# Patient Record
Sex: Female | Born: 1985 | Race: White | Hispanic: No | Marital: Married | Smoking: Never smoker
Health system: Southern US, Community
[De-identification: ages and names within clinical notes are randomized; demographics above are authoritative.]

## PROBLEM LIST (undated history)

## (undated) ENCOUNTER — Emergency Department (HOSPITAL_COMMUNITY): Admission: EM | Payer: Medicaid Other | Source: Home / Self Care

## (undated) DIAGNOSIS — O09299 Supervision of pregnancy with other poor reproductive or obstetric history, unspecified trimester: Secondary | ICD-10-CM

## (undated) DIAGNOSIS — F410 Panic disorder [episodic paroxysmal anxiety] without agoraphobia: Secondary | ICD-10-CM

## (undated) DIAGNOSIS — F329 Major depressive disorder, single episode, unspecified: Secondary | ICD-10-CM

## (undated) DIAGNOSIS — R51 Headache: Secondary | ICD-10-CM

## (undated) DIAGNOSIS — B977 Papillomavirus as the cause of diseases classified elsewhere: Secondary | ICD-10-CM

## (undated) DIAGNOSIS — F32A Depression, unspecified: Secondary | ICD-10-CM

## (undated) DIAGNOSIS — F4310 Post-traumatic stress disorder, unspecified: Secondary | ICD-10-CM

## (undated) DIAGNOSIS — H539 Unspecified visual disturbance: Secondary | ICD-10-CM

## (undated) DIAGNOSIS — R519 Headache, unspecified: Secondary | ICD-10-CM

## (undated) DIAGNOSIS — F319 Bipolar disorder, unspecified: Secondary | ICD-10-CM

## (undated) HISTORY — DX: Headache, unspecified: R51.9

## (undated) HISTORY — DX: Post-traumatic stress disorder, unspecified: F43.10

## (undated) HISTORY — DX: Major depressive disorder, single episode, unspecified: F32.9

## (undated) HISTORY — DX: Unspecified visual disturbance: H53.9

## (undated) HISTORY — DX: Panic disorder (episodic paroxysmal anxiety): F41.0

## (undated) HISTORY — DX: Headache: R51

## (undated) HISTORY — DX: Depression, unspecified: F32.A

## (undated) HISTORY — DX: Supervision of pregnancy with other poor reproductive or obstetric history, unspecified trimester: O09.299

## (undated) HISTORY — PX: BLOOD PATCH: SHX1245

## (undated) HISTORY — DX: Papillomavirus as the cause of diseases classified elsewhere: B97.7

## (undated) HISTORY — DX: Bipolar disorder, unspecified: F31.9

---
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
## 2012-10-27 ENCOUNTER — Ambulatory Visit (HOSPITAL_COMMUNITY)
Admission: RE | Admit: 2012-10-27 | Discharge: 2012-10-27 | Disposition: A | Discharge status: Home / Self Care | Payer: Self-pay | Source: Ambulatory Visit | Attending: Internal Medicine | Admitting: Internal Medicine

## 2012-10-27 ENCOUNTER — Other Ambulatory Visit (HOSPITAL_COMMUNITY): Payer: Self-pay | Admitting: Internal Medicine

## 2012-10-27 DIAGNOSIS — R05 Cough: Secondary | ICD-10-CM | POA: Insufficient documentation

## 2012-10-27 DIAGNOSIS — R059 Cough, unspecified: Secondary | ICD-10-CM | POA: Insufficient documentation

## 2012-10-27 DIAGNOSIS — J40 Bronchitis, not specified as acute or chronic: Secondary | ICD-10-CM

## 2013-01-28 ENCOUNTER — Ambulatory Visit (HOSPITAL_COMMUNITY): Payer: Self-pay | Admitting: Psychiatry

## 2015-11-08 DIAGNOSIS — F3181 Bipolar II disorder: Secondary | ICD-10-CM | POA: Insufficient documentation

## 2016-08-06 ENCOUNTER — Ambulatory Visit (INDEPENDENT_AMBULATORY_CARE_PROVIDER_SITE_OTHER): Payer: PRIVATE HEALTH INSURANCE | Admitting: Neurology

## 2016-08-06 ENCOUNTER — Encounter: Payer: Self-pay | Admitting: Neurology

## 2016-08-06 DIAGNOSIS — H4710 Unspecified papilledema: Secondary | ICD-10-CM | POA: Diagnosis not present

## 2016-08-06 DIAGNOSIS — G4700 Insomnia, unspecified: Secondary | ICD-10-CM | POA: Insufficient documentation

## 2016-08-06 DIAGNOSIS — G4719 Other hypersomnia: Secondary | ICD-10-CM | POA: Diagnosis not present

## 2016-08-06 DIAGNOSIS — R51 Headache: Secondary | ICD-10-CM

## 2016-08-06 DIAGNOSIS — G4733 Obstructive sleep apnea (adult) (pediatric): Secondary | ICD-10-CM | POA: Diagnosis not present

## 2016-08-06 DIAGNOSIS — R0683 Snoring: Secondary | ICD-10-CM | POA: Diagnosis not present

## 2016-08-06 DIAGNOSIS — H539 Unspecified visual disturbance: Secondary | ICD-10-CM | POA: Insufficient documentation

## 2016-08-06 DIAGNOSIS — R519 Headache, unspecified: Secondary | ICD-10-CM | POA: Insufficient documentation

## 2016-08-06 MED ORDER — DOXEPIN HCL 10 MG PO CAPS: 10.0000 mg | ORAL_CAPSULE | Freq: Every day | ORAL | 11 refills | Status: DC

## 2016-08-06 NOTE — Progress Notes (Signed)
[facility]  PATIENT: Veronica Hendrix DOB: 03/24/1986  REFERRING DOCTOR OR PCP:  Dr. Conley Rolls (Optometry; Fax [phone]); has appt to see new PCP [name] phone [phone]) SOURCE: patient, notes from Dr. Conley Rolls.    _________________________________   HISTORICAL  CHIEF COMPLAINT:  Chief Complaint  Patient presents with  . Headache    Veronica Hendrix is here for eval of h/a's, and to discuss results of abnormal eye exam.  Sts. onset 6 weeks ago of right sided h/a, assoc. with  nausea, one episode of vomiting.  No relief with Fioricet or otc Tylenol or Motrin.  Denes double or blurry vision. She saw her opthalmologist at [facility].  Notes state bilat optic nerve edema was noted, so she was referred to Dr. Lenice Pressman  . Optic Nerve Edema    HISTORY OF PRESENT ILLNESS:  I had the pleasure seeing you patient, Veronica Hendrix, at [facility] for neurologic consultation regarding her headaches and optic nerve edema. She is a 30 year old woman who has a history of intermittent headaches for many years. About a year ago, she began to notice more headaches. For while these were occurring daily and she sore Dr. Conley Rolls.  She was told she had abnormality and was supposed to come back a couple weeks later but was unable to do so. Headaches improved for a few months however, a couple months ago the headaches worsen again and now are occurring daily. The headaches are present when she wakes up and will sometimes get a little bit better after she is up and moving for a while.  However, later in the day the headaches worsen again. Describes the pain as pounding.Some of them are associated with she vomited once. The headaches are usually on the right side more than the left side. .   She notes that the pain will increase if she squats and gets back up. She also will feel dizzy if she does so. Over-the-counter medication has not helped. Fioricet takes the edge off for about  one hour but then the headache returns. She went back to see Dr. 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 Dr. Conley Rolls has sent her to our office for further evaluation.  She has noted mild visual disturbance. Specifically, per for vision is a little bit off at times. Additionally, if she squats and gets back up she will see dots in her vision for a minute or so. She denies any double vision. She denies any difficulty with her gait. There is no weakness or numbness. No changes are noted in bladder function.  She also notes difficulties with gait and sleepiness she snores but nobody has commented if she has pauses in her breathing or gasping. However, she has woken herself up with a large snore many times.    Additionally, she has moderate excessive daytime sleepiness and will doze off many evenings. Sleep is somewhat disturbed by her child who is currently 29 years old.   She weighs about 50 pounds more now than she did 4 years ago before her pregnancy.   EPWORTH SLEEPINESS SCALE  On a scale of 0 - 3 what is the chance of dozing:  Sitting and Reading:   1 Watching TV:    3 Sitting inactive in a public place: 2 Passenger in car for one hour: 3 Lying down to rest in the afternoon: 3 Sitting and talking to  someone: 0 Sitting quietly after lunch:  3 In a car, stopped in traffic:  0  Total (out of 24):    15/24 (moderate OSA)   REVIEW OF SYSTEMS: Constitutional: No fevers, chills, sweats, or change in appetite Eyes: Nas above Ear, nose and throat: No hearing loss, ear pain, nasal congestion, sore throat Cardiovascular: No chest pain, palpitations Respiratory: No shortness of breath at rest or with exertion.   No wheezes.   She snores and has some OSA signs.   GastrointestinaI: No nausea, vomiting, diarrhea, abdominal pain, fecal incontinence Genitourinary: No dysuria, urinary retention or frequency.  No nocturia. Musculoskeletal: No  neck pain, back pain Integumentary: No rash, pruritus, skin lesions Neurological: as above Psychiatric: No depression at this time.  No anxiety Endocrine: No palpitations, diaphoresis, change in appetite, change in weigh or increased thirst Hematologic/Lymphatic: No anemia, purpura, petechiae. Allergic/Immunologic: No itchy/runny eyes, nasal congestion, recent allergic reactions, rashes  ALLERGIES: No Known Allergies  HOME MEDICATIONS:  Current Outpatient Prescriptions:  .  butalbital-acetaminophen-caffeine (FIORICET, ESGIC) 50-325-40 MG tablet, Take by mouth 2 (two) times daily as needed for headache., Disp: , Rfl:  .  doxepin (SINEQUAN) 10 MG capsule, Take 1 capsule (10 mg total) by mouth at bedtime., Disp: 30 capsule, Rfl: 11  PAST MEDICAL HISTORY: Past Medical History:  Diagnosis Date  . Bipolar 1 disorder (HCC)   . Headache   . Panic attacks   . Post traumatic stress disorder (PTSD)   . Vision abnormalities     PAST SURGICAL HISTORY: No past surgical history on file.  FAMILY HISTORY: Family History  Problem Relation Age of Onset  . Diabetes Mother   . Mental illness Father     SOCIAL HISTORY:  Social History   Social History  . Marital status: Single    Spouse name: N/A  . Number of children: N/A  . Years of education: N/A   Occupational History  . Not on file.   Social History Main Topics  . Smoking status: Not on file  . Smokeless tobacco: Not on file  . Alcohol use Not on file  . Drug use: Unknown  . Sexual activity: Not on file   Other Topics Concern  . Not on file   Social History Narrative  . No narrative on file     PHYSICAL EXAM  Vitals:   08/06/16 1003  BP: 126/82  Pulse: 76  Resp: 18  Weight: 224 lb (101.6 kg)  Height: 5\' 5"  (1.651 m)    Body mass index is 37.28 kg/m.   General: The patient is well-developed and well-nourished and in no acute distress  Eyes:  Funduscopic exam shows mild to moderate papilledema and  absence of venous pulsations.  Neck: The neck is supple, no carotid bruits are noted.  The neck is nontender.  Cardiovascular: The heart has a regular rate and rhythm with a normal S1 and S2. There were no murmurs, gallops or rubs. Lungs are clear to auscultation.  Skin: Extremities are without significant edema.  Musculoskeletal:  Back is nontender  Neurologic Exam  Mental status: The patient is alert and oriented x 3 at the time of the examination. The patient has apparent normal recent and remote memory, with an apparently normal attention span and concentration ability.   Speech is normal.  Cranial nerves: Extraocular movements are full. Pupils are equal, round, and reactive to light and accomodation.  Visual fields are full to confrontation.  Color vision was symmetric,    Facial symmetry  is present. There is good facial sensation to soft touch bilaterally.Facial strength is normal.  Trapezius and sternocleidomastoid strength is normal. No dysarthria is noted.  The tongue is midline, and the patient has symmetric elevation of the soft palate. No obvious hearing deficits are noted.  Motor:  Muscle bulk is normal.   Tone is normal. Strength is  5 / 5 in all 4 extremities.   Sensory: Sensory testing is intact to pinprick, soft touch and vibration sensation in all 4 extremities.  Coordination: Cerebellar testing reveals good finger-nose-finger and heel-to-shin bilaterally.  Gait and station: Station is normal.   Gait is normal. Tandem gait is normal. Romberg is negative.   Reflexes: Deep tendon reflexes are symmetric and normal bilaterally.   Plantar responses are flexor.    DIAGNOSTIC DATA (LABS, IMAGING, TESTING) - I reviewed patient records, labs, notes, testing and imaging myself where available.     ASSESSMENT AND PLAN  Papilledema of both eyes - Plan: MR BRAIN W WO CONTRAST, MR ORBITS W WO CONTRAST  Chronic intractable headache, unspecified headache type - Plan: MR BRAIN  W WO CONTRAST, MR ORBITS W WO CONTRAST  Visual disturbance - Plan: MR BRAIN W WO CONTRAST, MR ORBITS W WO CONTRAST  Snoring - Plan: Split night study  OSA (obstructive sleep apnea) - Plan: Split night study  Excessive daytime sleepiness - Plan: Split night study   In summary, Veronica Hendrix is a 30 year old woman with bilateral nerve edema and headaches. She also gets episodes of visual changes as she stands up. Her symptoms are consistent with idiopathic intracranial hypertension (IIH; pseudotumor cerebri). However, we must also rule out a mass lesion. We will check an MRI of the brain with and without contrast. If the MRI of the brain does not show any contraindication to lumbar puncture, we will arrange this once we get the results. Additionally, she has symptoms of snoring, excessive daytime sleepiness and waking up gasping consistent with obstructive sleep apnea.   About a third or more of patients with idiopathic intracranial hypertension have obstructive sleep apnea and this may be playing some role in development of high IIH.    Therefore, I will check a split-night PSG and get her started on CPAP if she has moderate or severe sleep apnea.  She will return to see me in 2 months or sooner if there are new or worsening neurologic symptoms.  We will be calling her with the results of the studies as they are performed.  Thank you for asking me to see Mrs. Hendrix. Please let me know if I can be of further assistance with her or other patients in the future.      Dovid Bartko A. Epimenio Foot, MD, PhD 08/06/2016, 1:19 PM Certified in Neurology, Clinical Neurophysiology, Sleep Medicine, Pain Medicine and Neuroimaging  [facility] [address], [address] [place], [place] [address] [phone]

## 2016-08-07 ENCOUNTER — Ambulatory Visit (INDEPENDENT_AMBULATORY_CARE_PROVIDER_SITE_OTHER): Payer: PRIVATE HEALTH INSURANCE | Admitting: Pediatrics

## 2016-08-07 ENCOUNTER — Encounter: Payer: Self-pay | Admitting: Pediatrics

## 2016-08-07 VITALS — BP 113/84 | HR 73 | Temp 98.0°F | Ht 65.0 in | Wt 224.0 lb

## 2016-08-07 DIAGNOSIS — F319 Bipolar disorder, unspecified: Secondary | ICD-10-CM | POA: Diagnosis not present

## 2016-08-07 DIAGNOSIS — F411 Generalized anxiety disorder: Secondary | ICD-10-CM

## 2016-08-07 DIAGNOSIS — Z6834 Body mass index (BMI) 34.0-34.9, adult: Secondary | ICD-10-CM | POA: Insufficient documentation

## 2016-08-07 DIAGNOSIS — H4710 Unspecified papilledema: Secondary | ICD-10-CM

## 2016-08-07 DIAGNOSIS — Z6837 Body mass index (BMI) 37.0-37.9, adult: Secondary | ICD-10-CM | POA: Diagnosis not present

## 2016-08-07 MED ORDER — HYDROXYZINE HCL 10 MG PO TABS: 10.0000 mg | ORAL_TABLET | Freq: Three times a day (TID) | ORAL | 0 refills | Status: DC | PRN

## 2016-08-07 MED ORDER — BREXPIPRAZOLE 0.5 MG PO TABS: 0.5000 mg | ORAL_TABLET | Freq: Every day | ORAL | 1 refills | Status: DC

## 2016-08-07 NOTE — Progress Notes (Signed)
NOTES FROM TODAY'S OV HAVE BEEN FAXED TO DR. LE AT FAX# [phone], AND ALSO TO [facility] FAX# [phone], WITH FAX CONFIRMATIONS FROM BOTH #'S RECEIVED/fim

## 2016-08-07 NOTE — Progress Notes (Signed)
Subjective:   Patient ID: Veronica Hendrix, female    DOB: 04/11/1986, 30 y.o.   MRN: [id number] CC: New Patient (Initial Visit) f/u multiple med problems  HPI: Veronica Hendrix is a [age] y.o. 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 smear  Works as LawyerCNA  Elevated BMI: Skips meals often Drinking one cup of coffee a day, no other fluids  29yo Collin in preschool Son wakes up every two hours Possible ADHD, autism  Bipolar d/o: says symptoms have been fairly well controlled, was on rexulti in the past [facility] Did feel better on the rexulti than she does now Son with behavior problems, ADHD, ?autism, he is very anxious and wants to be with her all the time, when he cries she gets overwhelmed 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 Feels comfortable at work [employer]els able to do her job, take care of Veronica RumpfColin Pt's father lives with them, helps out with Veronica RumpfColin Denies thoughts of wanting to hurt herself Does say she has thought about not wanting to be here anymore at times in the past, none recent   GAD 7 : Generalized Anxiety Score 08/07/2016  Nervous, Anxious, on Edge 3  Control/stop worrying 2  Worry too much - different things 3  Trouble relaxing 2  Restless 1  Easily annoyed or irritable 3  Afraid - awful might happen 2  Total GAD 7 Score 16  Anxiety Difficulty Very difficult   Depression screen PHQ 2/9 08/07/2016  Decreased Interest 1  Down, Depressed, Hopeless 1  PHQ - 2 Score 2  Altered sleeping 2  Tired, decreased energy 2  Change in appetite 2  Feeling bad or failure about yourself  2  Trouble concentrating 2  Moving slowly or fidgety/restless 1  Suicidal thoughts 1  PHQ-9 Score 14  Difficult  doing work/chores Somewhat difficult     Past Medical History:  Diagnosis Date  . Bipolar 1 disorder (HCC)   . Headache   . HPV in female   . Panic attacks   . Post traumatic stress disorder (PTSD)   . Vision abnormalities    Family History  Problem Relation Age of Onset  . Diabetes Mother   . Mental illness Father   . Hypertension Father   . Heart disease Maternal Grandmother   . Heart disease Maternal Grandfather   . Heart disease Paternal Grandfather    Social History   Social History  . Marital status: Legally Separated    Spouse name: N/A  . Number of children: N/A  . Years of education: N/A   Social History Main Topics  . Smoking status: Never Smoker  . Smokeless tobacco: Never Used  . Alcohol use No  . Drug use: No  . Sexual activity: Yes    Birth control/ protection: Inserts     Comment: Mirena   Other Topics Concern  . None   Social History Narrative  . None   ROS: All systems negative other than what is in HPI  Objective:    BP 113/84   Pulse 73   Temp 98 F (36.7 C) (Oral)   Ht 5\' 5"  (1.651 m)   Wt 224 lb (101.6 kg)   BMI 37.28 kg/m  Wt Readings from Last 3 Encounters:  08/07/16 224 lb (101.6 kg)  08/06/16 224 lb (101.6 kg)    Gen: NAD, alert, cooperative with exam, NCAT EYES: EOMI, no conjunctival injection, or no icterus ENT:  TMs pearly gray b/l, OP without erythema LYMPH: no cervical LAD CV: NRRR, normal S1/S2, no murmur, distal pulses 2+ b/l Resp: CTABL, no wheezes, normal WOB Abd: +BS, soft, NTND. no guarding or organomegaly Ext: No edema, warm Neuro: Alert and oriented, strength equal b/l UE and LE, coordination grossly normal Psych: tearful at times, Full affect, mood "up and down", normal speech  Assessment & Plan:  Rai was seen today for new patient (initial visit), f/u multiple med problems.  Diagnoses and all orders for this visit:  Bipolar I disorder (HCC) Was on rexulti in the past, felt much better, lost to  follow up with psychiatry so stopped the medication Will restart, let me know if any worsenin gof symptoms Feels safe at home Encouraged pt to talk with pediatrician about son's behavior -     Brexpiprazole (REXULTI) 0.5 MG TABS; Take 1 tablet (0.5 mg total) by mouth daily. -     Ambulatory referral to Psychiatry  Anxiety state Has taken in the past, helped with anxiety -     hydrOXYzine (ATARAX/VISTARIL) 10 MG tablet; Take 1 tablet (10 mg total) by mouth 3 (three) times daily as needed.  Papilledema of both eyes Has MRI scheduled F/u with neuro  BMI 37.0-37.9, adult Cont lifestyle changes Decrease soda intake, eat three meals a day  Follow up plan: Return in about 2 weeks (around 08/21/2016) for Pap smear/CPE. Rex Kras, MD [place] [facility]

## 2016-08-07 NOTE — Progress Notes (Signed)
NOTES FROM TODAY'S OV HAVE BEEN FAXED TO DR. LE AT FAX# [phone], AND ALSO TO [facility] FAX# [phone], WITH FAX CONFIRMATION RECEIVED FROM BOTH #'S/fim

## 2016-08-21 ENCOUNTER — Telehealth: Payer: Self-pay

## 2016-08-21 NOTE — Telephone Encounter (Signed)
Has a referral order from 10/18 open, new

## 2016-08-22 ENCOUNTER — Ambulatory Visit (INDEPENDENT_AMBULATORY_CARE_PROVIDER_SITE_OTHER): Payer: PRIVATE HEALTH INSURANCE | Admitting: Pediatrics

## 2016-08-22 ENCOUNTER — Encounter: Payer: Self-pay | Admitting: Pediatrics

## 2016-08-22 ENCOUNTER — Other Ambulatory Visit: Payer: Self-pay

## 2016-08-22 VITALS — BP 106/69 | HR 77 | Temp 99.1°F | Ht 65.0 in | Wt 224.4 lb

## 2016-08-22 DIAGNOSIS — F319 Bipolar disorder, unspecified: Secondary | ICD-10-CM

## 2016-08-22 DIAGNOSIS — F329 Major depressive disorder, single episode, unspecified: Secondary | ICD-10-CM

## 2016-08-22 DIAGNOSIS — Z0000 Encounter for general adult medical examination without abnormal findings: Secondary | ICD-10-CM | POA: Diagnosis not present

## 2016-08-22 DIAGNOSIS — F32A Depression, unspecified: Secondary | ICD-10-CM

## 2016-08-22 DIAGNOSIS — H4710 Unspecified papilledema: Secondary | ICD-10-CM

## 2016-08-22 NOTE — Progress Notes (Addendum)
  Subjective:   Patient ID: Veronica Hendrix, female    DOB: October 07, 1986, 30 y.o.   MRN: [id number] CC: Annual Exam and Gynecologic Exam  HPI: Veronica Hendrix is a 30 y.o. female presenting for Annual Exam and Gynecologic Exam  Last pap smear abnormal, was about a year ago HPV positive  Papilledema: has MRI scheduled for 11/6 Still gets HA daily, not as badly as before Felt like someone "scooping my eye ball out yetserday" Gets fuzzyheaded with headache Has f/u with neurology  Bipolar: Still gets irritable some still, feeling better since starting back on meds Hydroxyzine helping some Has not yet gotten appt with psych  Has mirena for what will be 5 yrs early next year Not interested in pregnancy now, but says she will be in next few years Menstrual periods have been irregular while on mirena, short, occasional spotting No abnormal discharge  Relevant past medical, surgical, family and social history reviewed. Allergies and medications reviewed and updated. History  Smoking Status  . Never Smoker  Smokeless Tobacco  . Never Used   ROS: Per HPI   Objective:    BP 106/69   Pulse 77   Temp 99.1 F (37.3 C) (Oral)   Ht '5\' 5"'$  (1.651 m)   Wt 224 lb 6.4 oz (101.8 kg)   BMI 37.34 kg/m   Wt Readings from Last 3 Encounters:  08/22/16 224 lb 6.4 oz (101.8 kg)  08/07/16 224 lb (101.6 kg)  08/06/16 224 lb (101.6 kg)    Gen: NAD, alert, cooperative with exam, NCAT EYES: EOMI, no conjunctival injection, or no icterus ENT:   OP without erythema LYMPH: no cervical LAD CV: NRRR, normal S1/S2, no murmur, distal pulses 2+ b/l Resp: CTABL, no wheezes, normal WOB Abd: +BS, soft, NTND. no guarding or organomegaly Ext: No edema, warm Neuro: Alert and oriented MSK: normal muscle bulk Psych: normal affect, mood "better" GU: normal ext female genitalia, normal appearing cervix, small amount white discharge in vaginal vault  Assessment & Plan:  Veronica Hendrix was seen today for annual exam and  gynecologic exam.  Diagnoses and all orders for this visit:  Encounter for preventive health examination -     CMP14+EGFR -     Lipid panel -     TSH -     Pap IG, CT/NG w/ reflex HPV when [facility]) -     HIV antibody -     RPR  Bipolar I disorder ([facility]) Referral in for psychiatry follow up Counseling list given last visit  Papilledema of both eyes Has MRI scheduled, neuro f/u HA slightly improved  Follow up plan: Return in about 3 months (around 11/22/2016) for med follow up. Assunta Found, MD [place]

## 2016-08-22 NOTE — Telephone Encounter (Signed)
Dr Oswaldo DoneVincent  I can not see that referral   Some psychiatry referrals I cant see  If its entered as Behavioral Health or Psychiatry I dont think I can see it   Can you put it through again under Psychology?

## 2016-08-22 NOTE — Patient Instructions (Signed)
Call for appointment with OB

## 2016-08-23 LAB — RPR: RPR Ser Ql: NONREACTIVE

## 2016-08-23 LAB — CMP14+EGFR
ALK PHOS: 84 IU/L (ref 39–117)
ALT: 35 IU/L — ABNORMAL HIGH (ref 0–32)
AST: 23 IU/L (ref 0–40)
Albumin/Globulin Ratio: 2 (ref 1.2–2.2)
Albumin: 4.8 g/dL (ref 3.5–5.5)
BUN/Creatinine Ratio: 16 (ref 9–23)
BUN: 14 mg/dL (ref 6–20)
Bilirubin Total: 0.5 mg/dL (ref 0.0–1.2)
CHLORIDE: 100 mmol/L (ref 96–106)
CO2: 25 mmol/L (ref 18–29)
CREATININE: 0.89 mg/dL (ref 0.57–1.00)
Calcium: 9.7 mg/dL (ref 8.7–10.2)
GFR calc Af Amer: 101 mL/min/{1.73_m2} (ref >59)
GFR calc non Af Amer: 88 mL/min/{1.73_m2} (ref >59)
GLUCOSE: 80 mg/dL (ref 65–99)
Globulin, Total: 2.4 g/dL (ref 1.5–4.5)
Potassium: 4.4 mmol/L (ref 3.5–5.2)
SODIUM: 139 mmol/L (ref 134–144)
Total Protein: 7.2 g/dL (ref 6.0–8.5)

## 2016-08-23 LAB — LIPID PANEL
CHOLESTEROL TOTAL: 174 mg/dL (ref 100–199)
Chol/HDL Ratio: 3.8 ratio units (ref 0.0–4.4)
HDL: 46 mg/dL (ref >39)
LDL CALC: 102 mg/dL — AB (ref 0–99)
TRIGLYCERIDES: 129 mg/dL (ref 0–149)
VLDL Cholesterol Cal: 26 mg/dL (ref 5–40)

## 2016-08-23 LAB — TSH: TSH: 1.92 u[IU]/mL (ref 0.450–4.500)

## 2016-08-23 LAB — HIV ANTIBODY (ROUTINE TESTING W REFLEX): HIV SCREEN 4TH GENERATION: NONREACTIVE

## 2016-08-25 ENCOUNTER — Other Ambulatory Visit: Payer: Self-pay | Admitting: Pediatrics

## 2016-08-25 DIAGNOSIS — F411 Generalized anxiety disorder: Secondary | ICD-10-CM

## 2016-08-26 ENCOUNTER — Ambulatory Visit
Admission: RE | Admit: 2016-08-26 | Discharge: 2016-08-26 | Disposition: A | Discharge status: Home / Self Care | Payer: PRIVATE HEALTH INSURANCE | Source: Ambulatory Visit | Attending: Neurology | Admitting: Neurology

## 2016-08-26 ENCOUNTER — Ambulatory Visit
Admission: RE | Admit: 2016-08-26 | Discharge: 2016-08-26 | Disposition: A | Discharge status: Home / Self Care | Payer: Self-pay | Source: Ambulatory Visit | Attending: Neurology | Admitting: Neurology

## 2016-08-26 DIAGNOSIS — H539 Unspecified visual disturbance: Secondary | ICD-10-CM

## 2016-08-26 DIAGNOSIS — R51 Headache: Secondary | ICD-10-CM

## 2016-08-26 DIAGNOSIS — H4710 Unspecified papilledema: Secondary | ICD-10-CM | POA: Diagnosis not present

## 2016-08-26 DIAGNOSIS — G8929 Other chronic pain: Secondary | ICD-10-CM

## 2016-08-26 MED ORDER — GADOBENATE DIMEGLUMINE 529 MG/ML IV SOLN
20.0000 mL | Freq: Once | INTRAVENOUS | Status: AC | PRN
  Administered 2016-08-26: 20 mL via INTRAVENOUS

## 2016-08-27 ENCOUNTER — Encounter: Payer: Self-pay | Admitting: Pediatrics

## 2016-08-27 LAB — PAP IG, CT-NG, RFX HPV ASCU
CHLAMYDIA, NUC. ACID AMP: NEGATIVE
Gonococcus by Nucleic Acid Amp: NEGATIVE
PAP SMEAR COMMENT: 0

## 2016-08-28 ENCOUNTER — Encounter: Payer: Self-pay | Admitting: Neurology

## 2016-08-29 ENCOUNTER — Other Ambulatory Visit: Payer: Self-pay | Admitting: *Deleted

## 2016-08-29 DIAGNOSIS — G932 Benign intracranial hypertension: Secondary | ICD-10-CM

## 2016-08-29 MED ORDER — AZITHROMYCIN 250 MG PO TABS: ORAL_TABLET | ORAL | 0 refills | Status: DC

## 2016-08-30 ENCOUNTER — Encounter: Payer: Self-pay | Admitting: Pediatrics

## 2016-08-30 ENCOUNTER — Encounter: Payer: Self-pay | Admitting: Neurology

## 2016-09-04 ENCOUNTER — Telehealth (HOSPITAL_COMMUNITY): Payer: Self-pay | Admitting: *Deleted

## 2016-09-04 NOTE — Telephone Encounter (Signed)
left voice message regarding an appointment. 

## 2016-09-09 ENCOUNTER — Encounter: Payer: Self-pay | Admitting: Pediatrics

## 2016-09-19 ENCOUNTER — Ambulatory Visit
Admission: RE | Admit: 2016-09-19 | Discharge: 2016-09-19 | Disposition: A | Discharge status: Home / Self Care | Payer: PRIVATE HEALTH INSURANCE | Source: Ambulatory Visit | Attending: Neurology | Admitting: Neurology

## 2016-09-19 DIAGNOSIS — G932 Benign intracranial hypertension: Secondary | ICD-10-CM

## 2016-09-19 LAB — CSF CELL COUNT WITH DIFFERENTIAL
RBC COUNT CSF: 1 {cells}/uL (ref 0–10)
WBC CSF: 0 {cells}/uL (ref 0–5)

## 2016-09-19 LAB — GLUCOSE, CSF: Glucose, CSF: 60 mg/dL (ref 43–76)

## 2016-09-19 LAB — PROTEIN, CSF: Total Protein, CSF: 32 mg/dL (ref 15–45)

## 2016-09-19 NOTE — Discharge Instructions (Signed)

## 2016-09-20 ENCOUNTER — Telehealth: Payer: Self-pay | Admitting: *Deleted

## 2016-09-20 ENCOUNTER — Encounter: Payer: Self-pay | Admitting: *Deleted

## 2016-09-20 MED ORDER — ACETAZOLAMIDE ER 500 MG PO CP12: 500.0000 mg | ORAL_CAPSULE | Freq: Two times a day (BID) | ORAL | 1 refills | Status: DC

## 2016-09-20 NOTE — Telephone Encounter (Signed)
-----   Message from Richard A Sater, MD sent at 09/19/2016  5:35 PM EST ----- Please let her know that the lumbar puncture showed that her spinal fluid pressure was elevated. This is likely causing her symptoms.   Start Diamox 500 mg by mouth twice a day.     #60   #11 

## 2016-09-20 NOTE — Telephone Encounter (Signed)
I have spoken with Veronica Hendrix this morning, and per RAS, explained that LP pressure was elevated.  Offered Diamox 500 po bid.  She verbalized understanding of same, is agreeable with this plan.  Rx. escribed to [facility] per her request.  She sts. she still has slight h/a, not severe, with standing, better with lying down.  I have advised caffeine and water. 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  Letter prepared for her/fim

## 2016-09-20 NOTE — Telephone Encounter (Signed)
Work note fax[facility] to [facility] [facility] [employer] fax# [phone]/fim

## 2016-09-20 NOTE — Telephone Encounter (Signed)
-----   Message from Asa Lenteichard A Sater, MD sent at 09/19/2016  5:35 PM EST ----- Please let her know that the lumbar puncture showed that her spinal fluid pressure was elevated. This is likely causing her symptoms.   Start Diamox 500 mg by mouth twice a day.     #60   #11

## 2016-09-21 ENCOUNTER — Encounter: Payer: Self-pay | Admitting: Pediatrics

## 2016-09-21 ENCOUNTER — Telehealth: Payer: Self-pay | Admitting: Pediatrics

## 2016-09-22 ENCOUNTER — Encounter (HOSPITAL_COMMUNITY): Payer: Self-pay

## 2016-09-22 ENCOUNTER — Emergency Department (HOSPITAL_COMMUNITY)
Admission: EM | Admit: 2016-09-22 | Discharge: 2016-09-22 | Disposition: A | Discharge status: Home / Self Care | Payer: PRIVATE HEALTH INSURANCE | Attending: Emergency Medicine | Admitting: Emergency Medicine

## 2016-09-22 DIAGNOSIS — G971 Other reaction to spinal and lumbar puncture: Secondary | ICD-10-CM | POA: Insufficient documentation

## 2016-09-22 DIAGNOSIS — R51 Headache: Secondary | ICD-10-CM

## 2016-09-22 DIAGNOSIS — Z79899 Other long term (current) drug therapy: Secondary | ICD-10-CM | POA: Diagnosis not present

## 2016-09-22 DIAGNOSIS — R519 Headache, unspecified: Secondary | ICD-10-CM

## 2016-09-22 LAB — I-STAT CHEM 8, ED
BUN: 17 mg/dL (ref 6–20)
Calcium, Ion: 1.26 mmol/L (ref 1.15–1.40)
Chloride: 110 mmol/L (ref 101–111)
Creatinine, Ser: 1 mg/dL (ref 0.44–1.00)
GLUCOSE: 110 mg/dL — AB (ref 65–99)
HCT: 42 % (ref 36.0–46.0)
HEMOGLOBIN: 14.3 g/dL (ref 12.0–15.0)
POTASSIUM: 4 mmol/L (ref 3.5–5.1)
Sodium: 144 mmol/L (ref 135–145)
TCO2: 19 mmol/L (ref 0–100)

## 2016-09-22 LAB — CSF CULTURE
GRAM STAIN: NONE SEEN
ORGANISM ID, BACTERIA: NO GROWTH

## 2016-09-22 LAB — CSF CULTURE W GRAM STAIN: Gram Stain: NONE SEEN

## 2016-09-22 MED ORDER — METOCLOPRAMIDE HCL 5 MG/ML IJ SOLN
5.0000 mg | Freq: Once | INTRAMUSCULAR | Status: AC
  Administered 2016-09-22: 5 mg via INTRAVENOUS
  Filled 2016-09-22: qty 2

## 2016-09-22 NOTE — ED Notes (Signed)
Can not get pt to sign due to registration being in her chart at this time. Pt states understanding of d/c instructions

## 2016-09-22 NOTE — Discharge Instructions (Signed)
[facility] tomorrow at 7:30 AM at [phone] to arrange to get a  blood patch which will help your headache

## 2016-09-22 NOTE — ED Triage Notes (Signed)
Pt had spinal tap 11-30-017 for chronic headaches.  Headache is worsening, worse with sitting up.  Vomited x 3 today.  Not able to tolerate po fluids.  Last urinated on arrival to ED, mouth moist.

## 2016-09-22 NOTE — ED Provider Notes (Signed)
[facility]-EMERGENCY DEPT Provider Note   CSN: [id number] Arrival date & time: 09/22/16  1804     History   Chief Complaint Chief Complaint  Patient presents with  . Headache    HPI Veronica Hendrix is a 30 y.o. female.  HPI complains of diffuse headache onset September 2017. She reports that 3 days ago she had an LP performed at [facility], in order to drain CSF. Headache became worse the following day. Headache is made worse with standing and improved with lying supine. While lying supine headache is minimal. She is also had 5 episodes of vomiting over the past 2 days. No visual changes no nausea present. No treatment prior to coming here  Past Medical History:  Diagnosis Date  . Bipolar 1 disorder (HCC)   . Headache   . HPV in female   . Panic attacks   . Post traumatic stress disorder (PTSD)   . Vision abnormalities     Patient Active Problem List   Diagnosis Date Noted  . Bipolar I disorder (HCC) 08/07/2016  . BMI 37.0-37.9, adult 08/07/2016  . 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 Headache 08/06/2016  . Visual disturbance 08/06/2016  . Snoring 08/06/2016  . OSA (obstructive sleep apnea) 08/06/2016  . Excessive daytime sleepiness 08/06/2016  . Insomnia 08/06/2016    History reviewed. No pertinent surgical history.  OB History    No data available       Home Medications    Prior to Admission medications   Medication Sig Start Date End Date Taking? Authorizing Provider  acetaZOLAMIDE (DIAMOX) 500 MG capsule Take 1 capsule (500 mg total) by mouth 2 (two) times daily. 09/20/16   Asa Lente, MD  azithromycin (ZITHROMAX Z-PAK) 250 MG tablet Take 2 tablets on day one, then one tablet daily until gone. 08/29/16   Asa Lente, MD  Brexpiprazole (REXULTI) 0.5 MG TABS Take 1 tablet (0.5 mg total) by mouth daily. 08/07/16   Johna Sheriff, MD  butalbital-acetaminophen-caffeine (FIORICET, ESGIC) [phone] MG tablet Take by mouth 2 (two) times daily  as needed for headache.    Historical Provider, MD  doxepin (SINEQUAN) 10 MG capsule Take 1 capsule (10 mg total) by mouth at bedtime. 08/06/16   Asa Lente, MD  hydrOXYzine (ATARAX/VISTARIL) 10 MG tablet TAKE 1 TABLET (10 MG TOTAL) BY MOUTH 3 (THREE) TIMES DAILY AS NEEDED. 08/26/16   Johna Sheriff, MD    Family History Family History  Problem Relation Age of Onset  . Diabetes Mother   . Mental illness Father   . Hypertension Father   . Heart disease Maternal Grandmother   . Heart disease Maternal Grandfather   . Heart disease Paternal Grandfather     Social History Social History  Substance Use Topics  . Smoking status: Never Smoker  . Smokeless tobacco: Never Used  . Alcohol use No     Allergies   Patient has no known allergies.   Review of Systems Review of Systems  Constitutional: Negative.   HENT: Negative.   Respiratory: Negative.   Cardiovascular: Negative.   Gastrointestinal: Positive for vomiting.  Musculoskeletal: Negative.   Skin: Negative.   Neurological: Positive for headaches.  Psychiatric/Behavioral: Negative.   All other systems reviewed and are negative.    Physical Exam Updated Vital Signs BP 119/70   Pulse 88   Temp 98.6 F (37 C) (Oral)   Resp 20   SpO2 100%   Physical Exam  Constitutional: She is  oriented to person, place, and time. She appears well-developed and well-nourished.  HENT:  Head: Normocephalic and atraumatic.  Eyes: Conjunctivae are normal. Pupils are equal, round, and reactive to light.  Neck: Neck supple. No tracheal deviation present. No thyromegaly present.  Cardiovascular: Normal rate and regular rhythm.   No murmur heard. Pulmonary/Chest: Effort normal and breath sounds normal.  Abdominal: Soft. Bowel sounds are normal. She exhibits no distension. There is no tenderness.  Musculoskeletal: Normal range of motion. She exhibits no edema or tenderness.  Neurological: She is alert and oriented to person, place,  and time. No cranial nerve deficit. Coordination normal.  Gait normal. Romberg normal pronator drift normal DTR symmetric bilaterally at knee jerk ankle jerk and biceps toes downward going bilaterally. Headache becomes much more severe when patient stands up.  Skin: Skin is warm and dry. No rash noted.  Psychiatric: She has a normal mood and affect.  Nursing note and vitals reviewed.    ED Treatments / Results  Labs (all labs ordered are listed, but only abnormal results are displayed) Labs Reviewed  I-STAT CHEM 8, ED  Feels improved after treatment with IV Reglan. She is no longer nauseated and able to drink without difficulty. Spoke with radiologist. Plan [facility] 7:30 AM to arrange for blood patch  EKG  EKG Interpretation None       Radiology No results found.  Procedures Procedures (including critical care time) Results for orders placed or performed during the hospital encounter of 09/22/16  I-stat chem 8, ed  Result Value Ref Range   Sodium 144 135 - 145 mmol/L   Potassium 4.0 3.5 - 5.1 mmol/L   Chloride 110 101 - 111 mmol/L   BUN 17 6 - 20 mg/dL   Creatinine, Ser 1.021.00 0.44 - 1.00 mg/dL   Glucose, Bld 725110 (H) 65 - 99 mg/dL   Calcium, Ion 3.661.26 4.401.15 - 1.40 mmol/L   TCO2 19 0 - 100 mmol/L   Hemoglobin 14.3 12.0 - 15.0 g/dL   HCT 34.742.0 42.536.0 - 95.646.0 %   Mr Laqueta JeanBrain W Wo Contrast  Result Date: 08/26/2016  [facility] [address], [address] [place], [place] [address] [phone] NEUROIMAGING REPORT STUDY DATE: 08/26/2016 PATIENT NAME: Veronica Hendrix DOB: 1986-04-12 MRN: [id number] EXAM: MRI Brain with and without contrast ORDERING CLINICIAN: Richard A. Sater, MD. PhD CLINICAL HISTORY: 30 year old woman with papilledema, headaches and visual changes COMPARISON FILMS: None TECHNIQUE:MRI of the brain with and without contrast was obtained utilizing 5 mm axial slices with T1, T2, T2 flair, SWI and diffusion weighted views.   T1 sagittal, T2 coronal and postcontrast views in the axial and coronal plane were obtained. CONTRAST: 20 ml Multihance IMAGING SITE: [facility], [address] FINDINGS: On sagittal images, the spinal cord is imaged caudally to C5 and is normal in caliber.   The contents of the posterior fossa are of normal size and position.   The pituitary gland, sella turcica and optic chiasm appear normal.    Brain volume appears normal.   The ventricles are normal in size and without distortion.  There are no abnormal extra-axial collections of fluid.  The cerebellum and brainstem appears normal.   The deep gray matter appears normal.  The cerebral hemispheres appear normal.  Diffusion weighted images are normal.   The orbits appear normal.   The VIIth/VIIIth nerve complex appears normal.  The mastoid air cells appear normal.  A few of the ethmoid  air cells show mucoperiosteal thickening. The other paranasal sinuses appear normal.  Flow voids are identified within the major intracerebral arteries.   After the infusion of contrast material, a normal enhancement pattern is noted.    This MRI of the brain with and without contrast shows the following: 1.   Brain parenchyma appears normal before and after contrast administration. 2.   Mild chronic ethmoid sinusitis is noted. 3.   There are no acute findings. INTERPRETING PHYSICIAN: Richard A. Epimenio FootSater, MD, PhD Certified in  Neuroimaging by American Society of Neuroimaging   Dg Fluoro Guided Needle Plc Aspiration/injection Loc  Result Date: 09/19/2016 CLINICAL DATA:  Pseudotumor cerebri.  Abnormal eye exam. EXAM: DIAGNOSTIC LUMBAR PUNCTURE UNDER FLUOROSCOPIC GUIDANCE FLUOROSCOPY TIME:  Fluoroscopy Time:  10 seconds Radiation Exposure Index (if provided by the fluoroscopic device): 18.43 uGy*m2 Number of Acquired Spot Images: 0 PROCEDURE: Informed consent was obtained from the patient prior to the procedure, including potential complications of headache, allergy,  and pain. With the patient prone, the lower back was prepped with Betadine. 1% Lidocaine was used for local anesthesia. Lumbar puncture was performed at the left paramedian L2-3 level using a 20 gauge needle with return of clear CSF with an opening pressure of 34.0 cm water. 20.5 ml of CSF were obtained for laboratory studies. The closing pressure was 13.0 cm of water. The patient tolerated the procedure well and there were no apparent complications. IMPRESSION: 1. Technically successful fluoroscopic guided lumbar puncture via a left paramedian approach at the L2-3 level. 2. 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 3. Following removal of 20.5 mL of clear CSF, the pressure normalized at 13.0 cm water. Electronically Signed   By: Marin Robertshristopher  Mattern M.D.   On: 09/19/2016 15:54   Mr Rockwell GermanyOrbits W ZOWo Contrast  Result Date: 08/26/2016  [facility] [address], [address] [place], [place] [address] [phone] NEUROIMAGING REPORT STUDY DATE: 08/26/2016 PATIENT NAME: Veronica Hendrix DOB: 01-30-1986 MRN: [id number] EXAM: MRI of the orbits with and without contrast ORDERING CLINICIAN: Richard A. Sater, MD. PhD CLINICAL HISTORY: 30 year old woman with headache, vision changes and papilledema COMPARISON FILMS: None TECHNIQUE: MRI orbits with and without contrast obtained utilizing 3 mm thin cut slices in the axial and coronal planes with T1, T2, fat-saturated and postcontrast views. CONTRAST: 20 mL MultiHance IMAGING SITE: [facility], [address], [place], [place] FINDINGS: The globes, vascular structure, intraconal space and extraocular muscles demonstrate normal appearing anatomy.  The optic nerve sheaths are mildly widened (4.8 mm bilaterally).   The optic chiasm and sella region appear normal. No abnormal enhancing, inflammatory or compressive lesions are seen. Limited views of the brain parenchyma are unremarkable.   Mild  chronic ethmoid sinusitis is noted.    This MRI of the orbits with and without contrast shows the following: 1.    Optic nerve sheaths are mildly widened bilaterally.   Mild widening could be incidental or due to elevated intracranial pressure.  2.    Mild chronic ethmoid sinusitis is noted. 3.    There are no acute findings. INTERPRETING PHYSICIAN: Richard A. Epimenio FootSater, MD, PhD Certified in  Neuroimaging by American Society of Neuroimaging   Medications Ordered in ED Medications  metoCLOPramide (REGLAN) injection 5 mg (not administered)     Initial Impression / Assessment and Plan / ED Course  I have reviewed the triage vital signs and the nursing notes.  Pertinent labs & imaging results that were  available during my care of the patient were reviewed by me and considered in my medical decision making (see chart for details).  Clinical Course    Diagnosis spinal headache   Final Clinical Impressions(s) / ED Diagnoses   Final diagnoses:  None    New Prescriptions New Prescriptions   No medications on file     Doug Sou, MD 09/22/16 2139

## 2016-09-22 NOTE — ED Notes (Signed)
Patient has hx of h/a for last 2 months. 2 weeks ago had MRI done to r/o tumor. Last Thurs had LP done, in which patient was told her ICP was high. Between MRI and LP, patient states the h/a stopped. However, post op the LP, patient states her h/a has come back worse. Patient rates h/a pain at a 6/10. Patient took 1000 mg tylenol this AM.

## 2016-09-23 ENCOUNTER — Other Ambulatory Visit: Payer: Self-pay | Admitting: Neurology

## 2016-09-23 ENCOUNTER — Encounter: Payer: Self-pay | Admitting: Neurology

## 2016-09-23 ENCOUNTER — Telehealth: Payer: Self-pay | Admitting: Neurology

## 2016-09-23 ENCOUNTER — Ambulatory Visit
Admission: RE | Admit: 2016-09-23 | Discharge: 2016-09-23 | Disposition: A | Discharge status: Home / Self Care | Payer: PRIVATE HEALTH INSURANCE | Source: Ambulatory Visit | Attending: Neurology | Admitting: Neurology

## 2016-09-23 DIAGNOSIS — G971 Other reaction to spinal and lumbar puncture: Secondary | ICD-10-CM

## 2016-09-23 LAB — VDRL, CSF: VDRL Quant, CSF: NONREACTIVE

## 2016-09-23 LAB — ANGIOTENSIN CONVERTING ENZYME, CSF: ACE, CSF: 7 U/L (ref <=15)

## 2016-09-23 MED ORDER — IOPAMIDOL (ISOVUE-M 200) INJECTION 41%
1.0000 mL | Freq: Once | INTRAMUSCULAR | Status: AC
  Administered 2016-09-23: 1 mL via EPIDURAL

## 2016-09-23 NOTE — Telephone Encounter (Signed)
VO received from Dr. Epimenio FootSater to perform blood patch, [facility].

## 2016-09-23 NOTE — Progress Notes (Deleted)
Psychiatric Initial Adult Assessment   Patient Identification: Veronica Hendrix MRN:  [id number] Date of Evaluation:  09/23/2016 Referral Source: *** Chief Complaint:   Visit Diagnosis: No diagnosis found.  History of Present Illness:   Veronica Hendrix is a 30 year old female with bipolar I disorder, PTSD, anxiety   Associated Signs/Symptoms: Depression Symptoms:  {DEPRESSION SYMPTOMS:20000} (Hypo) Manic Symptoms:  {BHH MANIC SYMPTOMS:22872} Anxiety Symptoms:  {BHH ANXIETY SYMPTOMS:22873} Psychotic Symptoms:  {BHH PSYCHOTIC SYMPTOMS:22874} PTSD Symptoms: {BHH PTSD SYMPTOMS:22875}  Past Psychiatric History: ***  Previous Psychotropic Medications: {YES/NO:21197}  Substance Abuse History in the last 12 months:  {yes no:314532}  Consequences of Substance Abuse: {BHH CONSEQUENCES OF SUBSTANCE ABUSE:22880}  Past Medical History:  Past Medical History:  Diagnosis Date  . Bipolar 1 disorder (HCC)   . Headache   . HPV in female   . Panic attacks   . Post traumatic stress disorder (PTSD)   . Vision abnormalities    No past surgical history on file.  Family Psychiatric History: ***  Family History:  Family History  Problem Relation Age of Onset  . Diabetes Mother   . Mental illness Father   . Hypertension Father   . Heart disease Maternal Grandmother   . Heart disease Maternal Grandfather   . Heart disease Paternal Grandfather     Social History:   Social History   Social History  . Marital status: Legally Separated    Spouse name: N/A  . Number of children: N/A  . Years of education: N/A   Social History Main Topics  . Smoking status: Never Smoker  . Smokeless tobacco: Never Used  . Alcohol use No  . Drug use: No  . Sexual activity: Yes    Birth control/ protection: Inserts     Comment: Mirena   Other Topics Concern  . Not on file   Social History Narrative  . No narrative on file    Additional Social History: ***  Allergies:  No Known  Allergies  Metabolic Disorder Labs: No results found for: HGBA1C, MPG No results found for: PROLACTIN Lab Results  Component Value Date   CHOL 174 08/22/2016   TRIG 129 08/22/2016   HDL 46 08/22/2016   CHOLHDL 3.8 08/22/2016   LDLCALC 102 (H) 08/22/2016     Current Medications: Current Outpatient Prescriptions  Medication Sig Dispense Refill  . acetaminophen (TYLENOL) 500 MG tablet Take 1,000 mg by mouth every 6 (six) hours as needed for moderate pain.     Marland Kitchen. acetaZOLAMIDE (DIAMOX) 500 MG capsule Take 1 capsule (500 mg total) by mouth 2 (two) times daily. 60 capsule 1  . Brexpiprazole (REXULTI) 0.5 MG TABS Take 1 tablet (0.5 mg total) by mouth daily. 30 tablet 1  . doxepin (SINEQUAN) 10 MG capsule Take 1 capsule (10 mg total) by mouth at bedtime. 30 capsule 11  . hydrOXYzine (ATARAX/VISTARIL) 10 MG tablet TAKE 1 TABLET (10 MG TOTAL) BY MOUTH 3 (THREE) TIMES DAILY AS NEEDED. 60 tablet 2  . ibuprofen (ADVIL,MOTRIN) 200 MG tablet Take 800 mg by mouth every 6 (six) hours as needed.    Marland Kitchen. tetrahydrozoline (V-R EYE DROPS) 0.05 % ophthalmic solution Place 1 drop into both eyes 3 (three) times daily as needed (for dry eyes).     No current facility-administered medications for this visit.     Neurologic: Headache: {BHH YES OR NO:22294} Seizure: {BHH YES OR NO:22294} Paresthesias:{BHH YES OR WU:98119}O:22294}  Musculoskeletal: Strength & Muscle Tone: {desc; muscle tone:32375} Gait &  Station: {PE GAIT ED [id number]:22525} Patient leans: {Patient Leans:21022755}  Psychiatric Specialty Exam: ROS  There were no vitals taken for this visit.There is no height or weight on file to calculate BMI.  General Appearance: {Appearance:22683}  Eye Contact:  {BHH EYE CONTACT:22684}  Speech:  {Speech:22685}  Volume:  {Volume (PAA):22686}  Mood:  {BHH MOOD:22306}  Affect:  {Affect (PAA):22687}  Thought Process:  {Thought Process (PAA):22688}  Orientation:  {BHH ORIENTATION (PAA):22689}  Thought Content:   {Thought Content:22690}  Suicidal Thoughts:  {ST/HT (PAA):22692}  Homicidal Thoughts:  {ST/HT (PAA):22692}  Memory:  {BHH MEMORY:22881}  Judgement:  {Judgement (PAA):22694}  Insight:  {Insight (PAA):22695}  Psychomotor Activity:  {Psychomotor (PAA):22696}  Concentration:  {Concentration:21399}  Recall:  {BHH GOOD/FAIR/POOR:22877}  Fund of Knowledge:{BHH GOOD/FAIR/POOR:22877}  Language: {BHH GOOD/FAIR/POOR:22877}  Akathisia:  {BHH YES OR NO:22294}  Handed:  {Handed:22697}  AIMS (if indicated):  ***  Assets:  {Assets (PAA):22698}  ADL's:  {BHH WUJ'W:11914}ADL'S:22290}  Cognition: {chl bhh cognition:304700322}  Sleep:  ***    Treatment Plan Summary: {CHL AMB BH MD TX NWGN:[id number]}[phone]}   Neysa Hottereina Timiya Howells, MD 12/4/201711:54 AM

## 2016-09-23 NOTE — Discharge Instructions (Signed)

## 2016-09-23 NOTE — Telephone Encounter (Signed)
Deb/GI [phone] called to advise the pt had LP on Thursday, she went to ER yesterday with spinal HA. Advised if she could get blood patch order asap she can get the pt in at 10am today.

## 2016-09-24 ENCOUNTER — Ambulatory Visit (HOSPITAL_COMMUNITY): Payer: Self-pay | Admitting: Psychiatry

## 2016-09-30 ENCOUNTER — Encounter: Payer: Self-pay | Admitting: Neurology

## 2016-10-01 ENCOUNTER — Encounter: Payer: Self-pay | Admitting: Pediatrics

## 2016-10-01 ENCOUNTER — Encounter: Payer: Self-pay | Admitting: Neurology

## 2016-10-01 ENCOUNTER — Telehealth: Payer: Self-pay | Admitting: Neurology

## 2016-10-01 NOTE — Telephone Encounter (Signed)
I have spoken with Veronica Hendrix and offered appt. with RAS this afternoon, but she lives an hour away, doesn't have childcare for her young children.  Appt. given 9am tomorrow/fim

## 2016-10-01 NOTE — Telephone Encounter (Signed)
Patient is returning your call.  
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
## 2016-10-02 ENCOUNTER — Encounter: Payer: Self-pay | Admitting: Neurology

## 2016-10-02 ENCOUNTER — Ambulatory Visit (INDEPENDENT_AMBULATORY_CARE_PROVIDER_SITE_OTHER): Payer: PRIVATE HEALTH INSURANCE | Admitting: Neurology

## 2016-10-02 ENCOUNTER — Other Ambulatory Visit: Payer: Self-pay | Admitting: Pediatrics

## 2016-10-02 VITALS — BP 112/82 | HR 84 | Resp 6 | Ht 65.0 in | Wt 218.5 lb

## 2016-10-02 DIAGNOSIS — H4710 Unspecified papilledema: Secondary | ICD-10-CM | POA: Diagnosis not present

## 2016-10-02 DIAGNOSIS — G932 Benign intracranial hypertension: Secondary | ICD-10-CM | POA: Insufficient documentation

## 2016-10-02 DIAGNOSIS — R51 Headache: Secondary | ICD-10-CM | POA: Diagnosis not present

## 2016-10-02 DIAGNOSIS — M545 Low back pain, unspecified: Secondary | ICD-10-CM

## 2016-10-02 DIAGNOSIS — F319 Bipolar disorder, unspecified: Secondary | ICD-10-CM

## 2016-10-02 DIAGNOSIS — R519 Headache, unspecified: Secondary | ICD-10-CM

## 2016-10-02 DIAGNOSIS — M549 Dorsalgia, unspecified: Secondary | ICD-10-CM | POA: Insufficient documentation

## 2016-10-02 MED ORDER — METHYLPREDNISOLONE 4 MG PO TBPK: ORAL_TABLET | ORAL | 0 refills | Status: DC

## 2016-10-02 MED ORDER — DIAZEPAM 5 MG PO TABS: 5.0000 mg | ORAL_TABLET | Freq: Four times a day (QID) | ORAL | 0 refills | Status: DC | PRN

## 2016-10-02 NOTE — Progress Notes (Signed)
[facility]  PATIENT: Veronica Hendrix DOB: 03/18/1986  REFERRING DOCTOR OR PCP:  Dr. Conley Hendrix ([facility]; Fax [phone]); has appt to see new PCP Veronica Hendrix([facility] Veronica Hendrix phone [phone]) SOURCE: patient, notes from Dr. Conley Hendrix.    _________________________________   HISTORICAL  CHIEF COMPLAINT:  Chief Complaint  Patient presents with  . Pseudotumor Cerebri    Had LP on 09-23-16.  Sts. she had severe post-procedure h/a, and returned to GI for a blood patch on 09-27-16.  Sts. she is taking Diamox as rx'd.  Sts.h/a is now resolved.  Sts. now, since LP, she has lower back, coccygeal pain, pain back of both thighs, worse with walking.  Sts. she has tingling in right knee and right foot./fim    HISTORY OF PRESENT ILLNESS:  Veronica Hendrix is a 30 year old woman with hreadaches and papilledema.     IIH:   She underwent a lumbar puncture 09/18/16 and opening pressure was elevated at 34 cm consistent with idiopathic intracranial hypertension. Unfortunately, she had a positional headache afterwards and return for a blood patch 09/23/2016.  She felt better afterwards and walked to her car without LBP and just a mild headache.   She is now on Diamox twice a day. She gets a little bit of tingling in her lips and fingers. She also has noted tinnitus since starting the medicine  Headaches/vision:  These are doing better since the LP 2 weeks ago.   Vision changes are also better.  Leg pain/hip pain:   One day after the blood patch 09/23/16, she has note LBP, hip pain and leg pain.   Pain goes into the proximal posterior leg.    She describe  Other:  She felt a jolt going down her body when she turned her head for a few days after the epidural blood patch but this has not occurred the past 2 days.  She also notes difficulties with sleepiness she snores but nobody has commented if she has pauses in her breathing or gasping.   Her Epworth Sleepiness Scale score showed moderate sleepiness, 15/24 at  the initial visit.   She weighs about 50 pounds more now than she did 4 years ago before her pregnancy.    REVIEW OF SYSTEMS: Constitutional: No fevers, chills, sweats, or change in appetite Eyes: Nas above Ear, nose and throat: No hearing loss, ear pain, nasal congestion, sore throat Cardiovascular: No chest pain, palpitations Respiratory: No shortness of breath at rest or with exertion.   No wheezes.   She snores and has some OSA signs.   GastrointestinaI: No nausea, vomiting, diarrhea, abdominal pain, fecal incontinence Genitourinary: No dysuria, urinary retention or frequency.  No nocturia. Musculoskeletal: No neck pain, back pain Integumentary: No rash, pruritus, skin lesions Neurological: as above Psychiatric: No depression at this time.  No anxiety Endocrine: No palpitations, diaphoresis, change in appetite, change in weigh or increased thirst Hematologic/Lymphatic: No anemia, purpura, petechiae. Allergic/Immunologic: No itchy/runny eyes, nasal congestion, recent allergic reactions, rashes  ALLERGIES: No Known Allergies  HOME MEDICATIONS:  Current Outpatient Prescriptions:  .  acetaZOLAMIDE (DIAMOX) 500 MG capsule, Take 1 capsule (500 mg total) by mouth 2 (two) times daily., Disp: 60 capsule, Rfl: 1 .  doxepin (SINEQUAN) 10 MG capsule, Take 1 capsule (10 mg total) by mouth at bedtime., Disp: 30 capsule, Rfl: 11 .  hydrOXYzine (ATARAX/VISTARIL) 10 MG tablet, TAKE 1 TABLET (10 MG TOTAL) BY MOUTH 3 (THREE) TIMES DAILY AS NEEDED., Disp: 60 tablet, Rfl: 2 .  acetaminophen (  TYLENOL) 500 MG tablet, Take 1,000 mg by mouth every 6 (six) hours as needed for moderate pain. , Disp: , Rfl:  .  diazepam (VALIUM) 5 MG tablet, Take 1 tablet (5 mg total) by mouth every 6 (six) hours as needed for anxiety. Take one pill up to 3 times a day for back spasms, Disp: 20 tablet, Rfl: 0 .  ibuprofen (ADVIL,MOTRIN) 200 MG tablet, Take 800 mg by mouth every 6 (six) hours as needed., Disp: , Rfl:  .   methylPREDNISolone (MEDROL DOSEPAK) 4 MG TBPK tablet, 6 pills po x 1d, then 5 pills po x 1 d, then 4 pills a day x 1d, then 3 pills po x 1d, then 2 pills po x 1d then 1 pill po x 1d, Disp: 21 tablet, Rfl: 0 .  REXULTI 0.5 MG TABS, TAKE 1 TABLET (0.5 MG TOTAL) BY MOUTH DAILY., Disp: 30 tablet, Rfl: 1 .  tetrahydrozoline (V-R EYE DROPS) 0.05 % ophthalmic solution, Place 1 drop into both eyes 3 (three) times daily as needed (for dry eyes)., Disp: , Rfl:   PAST MEDICAL HISTORY: Past Medical History:  Diagnosis Date  . Bipolar 1 disorder (HCC)   . Headache   . HPV in female   . Panic attacks   . Post traumatic stress disorder (PTSD)   . Vision abnormalities     PAST SURGICAL HISTORY: No past surgical history on file.  FAMILY HISTORY: Family History  Problem Relation Age of Onset  . Diabetes Mother   . Mental illness Father   . Hypertension Father   . Heart disease Maternal Grandmother   . Heart disease Maternal Grandfather   . Heart disease Paternal Grandfather     SOCIAL HISTORY:  Social History   Social History  . Marital status: Legally Separated    Spouse name: N/A  . Number of children: N/A  . Years of education: N/A   Occupational History  . Not on file.   Social History Main Topics  . Smoking status: Never Smoker  . Smokeless tobacco: Never Used  . Alcohol use No  . Drug use: No  . Sexual activity: Yes    Birth control/ protection: Inserts     Comment: Mirena   Other Topics Concern  . Not on file   Social History Narrative  . No narrative on file     PHYSICAL EXAM  Vitals:   10/02/16 0926  BP: 112/82  Pulse: 84  Resp: (!) 6  Weight: 218 lb 8 oz (99.1 kg)  Height: 5\' 5"  (1.651 m)    Body mass index is 36.36 kg/m.   General: The patient is well-developed and well-nourished and in no acute distress   Back:  Non-tender over paraspinal muscles, piriformis muscles and trochanteric bursae.   Pain increases with bending.    Eyes:  Funduscopic  exam shows improved papilledema .  Neurologic Exam  Mental status: The patient is alert and oriented x 3 at the time of the examination. The patient has apparent normal recent and remote memory, with an apparently normal attention span and concentration ability.   Speech is normal.  Cranial nerves: Extraocular movements are full. Pupils are equal, round, and reactive to light and accomodation.    Facial symmetry is present. There is good facial sensation to soft touch bilaterally.Facial strength is normal.  Trapezius and sternocleidomastoid strength is normal. No dysarthria is noted.  The tongue is midline, and the patient has symmetric elevation of the soft palate. No obvious  hearing deficits are noted.  Motor:  Muscle bulk is normal.   Tone is normal. Strength is  5 / 5 in all 4 extremities.   Sensory: Sensory testing is intact to touch in all 4 extremities.  Coordination: Cerebellar testing reveals good finger-nose-finger and heel-to-shin bilaterally.  Gait and station: Station is normal.   Gait is arthritic. Tandem gait is normal. Romberg is negative.   Reflexes: Deep tendon reflexes are symmetric and normal bilaterally.       DIAGNOSTIC DATA (LABS, IMAGING, TESTING) - I reviewed patient records, labs, notes, testing and imaging myself where available.     ASSESSMENT AND PLAN  Idiopathic intracranial hypertension  Papilledema of both eyes  Nonintractable headache, unspecified chronicity pattern, unspecified headache type  Acute bilateral low back pain without sciatica   1.   Continue Diamox.   If ear ringing persists, consider decrease dose to one a day 2.   For current pain I'll call in a steroid pack and #20 valiums (up to tid for spasms) 3.   Note to return to work on Saturday 4.   If unable to lose weight, will need a PSG rtc 6 weeks, sooner if new or worsening problems   Luca Dyar A. Epimenio Foot, MD, PhD 10/02/2016, 10:23 AM Certified in Neurology, Clinical  Neurophysiology, Sleep Medicine, Pain Medicine and Neuroimaging  [facility] [address], [address] [place], [place] [address] [phone]

## 2016-10-03 ENCOUNTER — Encounter (HOSPITAL_COMMUNITY): Payer: Self-pay | Admitting: Psychiatry

## 2016-10-03 ENCOUNTER — Ambulatory Visit (INDEPENDENT_AMBULATORY_CARE_PROVIDER_SITE_OTHER): Payer: Self-pay | Admitting: Psychiatry

## 2016-10-03 ENCOUNTER — Telehealth (HOSPITAL_COMMUNITY): Payer: Self-pay | Admitting: *Deleted

## 2016-10-03 VITALS — BP 117/78 | HR 78 | Ht 65.0 in | Wt 216.4 lb

## 2016-10-03 DIAGNOSIS — Z8249 Family history of ischemic heart disease and other diseases of the circulatory system: Secondary | ICD-10-CM

## 2016-10-03 DIAGNOSIS — Z79899 Other long term (current) drug therapy: Secondary | ICD-10-CM

## 2016-10-03 DIAGNOSIS — F331 Major depressive disorder, recurrent, moderate: Secondary | ICD-10-CM | POA: Insufficient documentation

## 2016-10-03 DIAGNOSIS — Z833 Family history of diabetes mellitus: Secondary | ICD-10-CM

## 2016-10-03 DIAGNOSIS — R45851 Suicidal ideations: Secondary | ICD-10-CM

## 2016-10-03 DIAGNOSIS — Z818 Family history of other mental and behavioral disorders: Secondary | ICD-10-CM

## 2016-10-03 MED ORDER — SERTRALINE HCL 25 MG PO TABS: ORAL_TABLET | ORAL | 1 refills | Status: DC

## 2016-10-03 NOTE — Patient Instructions (Signed)
1. Start sertraline 25 mg daily for two weeks, then 50 mg daily 2. Continue rexulti 0.5 mg daily 3. Continue hydrxyzine 10-20 mg twice a day as needed for anxiety 4. Return to clinic in January 5. Contact for therapy: Dr. Daisy BlossomSara W Schneidmiller   [phone] [address], [place], [place] [address]

## 2016-10-03 NOTE — Telephone Encounter (Signed)
voice message from patient, said her insurance will not pay for her meds.

## 2016-10-04 ENCOUNTER — Other Ambulatory Visit (HOSPITAL_COMMUNITY): Payer: Self-pay | Admitting: Psychiatry

## 2016-10-04 ENCOUNTER — Other Ambulatory Visit: Payer: Self-pay | Admitting: Pediatrics

## 2016-10-04 DIAGNOSIS — F319 Bipolar disorder, unspecified: Secondary | ICD-10-CM

## 2016-10-04 MED ORDER — BREXPIPRAZOLE 0.5 MG PO TABS: 0.5000 mg | ORAL_TABLET | Freq: Every day | ORAL | 1 refills | Status: DC

## 2016-10-04 MED ORDER — SERTRALINE HCL 50 MG PO TABS: ORAL_TABLET | ORAL | 1 refills | Status: DC

## 2016-10-07 ENCOUNTER — Ambulatory Visit: Payer: PRIVATE HEALTH INSURANCE | Admitting: Neurology

## 2016-10-07 NOTE — Telephone Encounter (Signed)
Spoke with pt and she stated her insurance will not pay for her Zoloft. [facility] and [facility] stated pt medication was called into pharmacy on 10-04-2016 with SIG 50 mg taking 0.5 tabs for 1 wk and increasing to 50 mg.

## 2016-10-09 ENCOUNTER — Telehealth (HOSPITAL_COMMUNITY): Payer: Self-pay | Admitting: *Deleted

## 2016-10-09 ENCOUNTER — Telehealth: Payer: Self-pay

## 2016-10-09 NOTE — Telephone Encounter (Signed)
Pt pharmacy called stating pt brought in a script that was printed on 10-04-2016. Per Okey Regalarol from pharmacy, on the script pt brought in to them, it has Dr.Vincent's name was on pt Rexulti. Informed pt pharmacy, pt medication was written on 10-04-2016 and pt last saw Dr. Vanetta ShawlHisada on 10-03-2016. Per pt pharmacy, pt Rexulti needs a Prior Auth and pt wants them to send the Auth to Dr. Bing MatterHisada's office to get it authorized. Informed pt pharmacy to send request to her doctor it stated on medication due to the dates. Pt pharmacy verbalized understanding.

## 2016-10-10 ENCOUNTER — Encounter: Payer: Self-pay | Admitting: Pediatrics

## 2016-10-11 NOTE — Telephone Encounter (Signed)
x

## 2016-10-15 ENCOUNTER — Telehealth: Payer: Self-pay | Admitting: Pediatrics

## 2016-10-15 NOTE — Telephone Encounter (Signed)
Pt called  - she states that [facility] -- she said they were sending something in - but the pharm never received it --- she is aware to call them back for meds.

## 2016-10-21 ENCOUNTER — Encounter: Payer: Self-pay | Admitting: Pediatrics
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
## 2016-11-07 ENCOUNTER — Ambulatory Visit (HOSPITAL_COMMUNITY): Payer: Self-pay | Admitting: Psychiatry
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
## 2016-11-14 ENCOUNTER — Encounter (HOSPITAL_COMMUNITY): Payer: Self-pay | Admitting: Psychiatry

## 2016-11-14 ENCOUNTER — Ambulatory Visit (INDEPENDENT_AMBULATORY_CARE_PROVIDER_SITE_OTHER): Payer: PRIVATE HEALTH INSURANCE | Admitting: Psychiatry

## 2016-11-14 DIAGNOSIS — Z818 Family history of other mental and behavioral disorders: Secondary | ICD-10-CM

## 2016-11-14 DIAGNOSIS — F331 Major depressive disorder, recurrent, moderate: Secondary | ICD-10-CM | POA: Diagnosis not present

## 2016-11-14 DIAGNOSIS — Z8249 Family history of ischemic heart disease and other diseases of the circulatory system: Secondary | ICD-10-CM

## 2016-11-14 DIAGNOSIS — F411 Generalized anxiety disorder: Secondary | ICD-10-CM | POA: Diagnosis not present

## 2016-11-14 DIAGNOSIS — Z833 Family history of diabetes mellitus: Secondary | ICD-10-CM

## 2016-11-14 DIAGNOSIS — Z79899 Other long term (current) drug therapy: Secondary | ICD-10-CM

## 2016-11-14 DIAGNOSIS — Z7982 Long term (current) use of aspirin: Secondary | ICD-10-CM

## 2016-11-14 MED ORDER — SERTRALINE HCL 100 MG PO TABS: 100.0000 mg | ORAL_TABLET | Freq: Every day | ORAL | 0 refills | Status: DC

## 2016-11-14 MED ORDER — HYDROXYZINE HCL 10 MG PO TABS: 10.0000 mg | ORAL_TABLET | Freq: Two times a day (BID) | ORAL | 0 refills | Status: DC | PRN

## 2016-11-14 MED ORDER — BREXPIPRAZOLE 0.5 MG PO TABS: 0.5000 mg | ORAL_TABLET | Freq: Every day | ORAL | 0 refills | Status: DC

## 2016-11-14 NOTE — Patient Instructions (Addendum)
1. Increse sertraline 100 mg daily 2. Continue hydroxyzine 10 mg twice a day as needed for anxiety 3. Return to clinic in one month 4. Contact for therapy: Dr. Daisy BlossomSara W Schneidmiller   [phone] [address], [place], [place] [address]

## 2016-11-18 ENCOUNTER — Encounter: Payer: Self-pay | Admitting: Pediatrics

## 2016-11-21 ENCOUNTER — Ambulatory Visit: Payer: PRIVATE HEALTH INSURANCE | Admitting: Neurology

## 2016-11-21 ENCOUNTER — Other Ambulatory Visit: Payer: Self-pay | Admitting: Neurology

## 2016-11-28 ENCOUNTER — Ambulatory Visit: Payer: PRIVATE HEALTH INSURANCE | Admitting: Pediatrics

## 2016-12-02 ENCOUNTER — Encounter: Payer: Self-pay | Admitting: Family Medicine

## 2016-12-02 ENCOUNTER — Ambulatory Visit (INDEPENDENT_AMBULATORY_CARE_PROVIDER_SITE_OTHER): Payer: PRIVATE HEALTH INSURANCE | Admitting: Family Medicine

## 2016-12-02 VITALS — BP 101/69 | HR 72 | Temp 97.9°F | Ht 65.0 in | Wt 210.0 lb

## 2016-12-02 DIAGNOSIS — J028 Acute pharyngitis due to other specified organisms: Secondary | ICD-10-CM

## 2016-12-02 DIAGNOSIS — B9789 Other viral agents as the cause of diseases classified elsewhere: Secondary | ICD-10-CM

## 2016-12-02 DIAGNOSIS — J029 Acute pharyngitis, unspecified: Secondary | ICD-10-CM

## 2016-12-02 NOTE — Progress Notes (Signed)
BP 101/69   Pulse 72   Temp 97.9 F (36.6 C) (Oral)   Ht 5\' 5"  (1.651 m)   Wt 210 lb (95.3 kg)   BMI 34.95 kg/m    Subjective:    Patient ID: Veronica Hendrix, female    DOB: 17-Jan-1986, 31 y.o.   MRN: [id number]  HPI: Veronica Hendrix is a 31 y.o. female presenting on 12/02/2016 for Sore Throat; Fever; Cough; and Headache   HPI Cough and sore throat and fever and headache Patient has been having a cough and a sore throat and a low-grade fever and a headache that's been going on for the past day and a half. The fever was in the 99 range but never above that. She has been having a cough which is mostly dry and nonproductive and a sore throat and a headache that's been going on since then. She denies any sick contacts that she knows of. 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 She did have influenza 2 weeks ago and got treated and got over that. There has not been any cases of influenza going around her nursing home that she knows of. She has used some Tylenol and ibuprofen but nothing else to this point and that did help control her fever.  Relevant past medical, surgical, family and social history reviewed and updated as indicated. Interim medical history since our last visit reviewed. Allergies and medications reviewed and updated.  Review of Systems  Constitutional: Negative for chills and fever.  HENT: Positive for congestion, postnasal drip, rhinorrhea, sinus pressure and sore throat. Negative for ear discharge, ear pain and sneezing.   Eyes: Negative for pain, redness and visual disturbance.  Respiratory: Positive for cough. Negative for chest tightness and shortness of breath.   Cardiovascular: Negative for chest pain and leg swelling.  Genitourinary: Negative for difficulty urinating and dysuria.  Musculoskeletal: Negative for back pain and gait problem.  Skin: Negative for rash.  Neurological: Negative for light-headedness and headaches.    Psychiatric/Behavioral: Negative for agitation and behavioral problems.  All other systems reviewed and are negative.   Per HPI unless specifically indicated above     Objective:    BP 101/69   Pulse 72   Temp 97.9 F (36.6 C) (Oral)   Ht 5\' 5"  (1.651 m)   Wt 210 lb (95.3 kg)   BMI 34.95 kg/m   Wt Readings from Last 3 Encounters:  12/02/16 210 lb (95.3 kg)  10/02/16 218 lb 8 oz (99.1 kg)  08/22/16 224 lb 6.4 oz (101.8 kg)    Physical Exam  Constitutional: She is oriented to person, place, and time. She appears well-developed and well-nourished. No distress.  HENT:  Right Ear: Tympanic membrane, external ear and ear canal normal.  Left Ear: Tympanic membrane, external ear and ear canal normal.  Nose: Mucosal edema and rhinorrhea present. No epistaxis. Right sinus exhibits no maxillary sinus tenderness and no frontal sinus tenderness. Left sinus exhibits no maxillary sinus tenderness and no frontal sinus tenderness.  Mouth/Throat: Uvula is midline and mucous membranes are normal. Posterior oropharyngeal edema and posterior oropharyngeal erythema present. No oropharyngeal exudate or tonsillar abscesses.  Eyes: Conjunctivae are normal.  Cardiovascular: Normal rate, regular rhythm, normal heart sounds and intact distal pulses.   No murmur heard. Pulmonary/Chest: Effort normal and breath sounds normal. No respiratory distress. She has no wheezes. She has no rales.  Musculoskeletal: Normal range of motion. She exhibits  no edema or tenderness.  Lymphadenopathy:    She has no cervical adenopathy.  Neurological: She is alert and oriented to person, place, and time. Coordination normal.  Skin: Skin is warm and dry. No rash noted. She is not diaphoretic.  Psychiatric: She has a normal mood and affect. Her behavior is normal.  Vitals reviewed.     Assessment & Plan:   Problem List Items Addressed This Visit    None    Visit Diagnoses    Acute viral pharyngitis    -  Primary   No  fevers, already had influenza a couple weeks ago, likely viral, Flonase, Mucinex, nasal saline, antihistamines       Follow up plan: Return if symptoms worsen or fail to improve.  Counseling provided for all of the vaccine components No orders of the defined types were placed in this encounter.   Arville CareJoshua Zaharah Amir, MD [facility] 12/02/2016, 3:42 PM

## 2016-12-04 ENCOUNTER — Telehealth: Payer: Self-pay | Admitting: Family Medicine

## 2016-12-04 NOTE — Telephone Encounter (Signed)
Letter ready for pick up. Attempted to call patient- no answer and mailbox full.

## 2016-12-04 NOTE — Telephone Encounter (Signed)
Patient was seen 2/12, is it okay to excuse her for these days?

## 2016-12-04 NOTE — Telephone Encounter (Signed)
Yes ok to do

## 2016-12-05 ENCOUNTER — Ambulatory Visit (INDEPENDENT_AMBULATORY_CARE_PROVIDER_SITE_OTHER): Payer: PRIVATE HEALTH INSURANCE | Admitting: Family Medicine

## 2016-12-05 ENCOUNTER — Encounter: Payer: Self-pay | Admitting: Family Medicine

## 2016-12-05 VITALS — BP 116/78 | HR 94 | Temp 99.2°F | Ht 65.0 in | Wt 210.6 lb

## 2016-12-05 DIAGNOSIS — J181 Lobar pneumonia, unspecified organism: Secondary | ICD-10-CM

## 2016-12-05 DIAGNOSIS — J189 Pneumonia, unspecified organism: Secondary | ICD-10-CM

## 2016-12-05 MED ORDER — LEVOFLOXACIN 500 MG PO TABS: 500.0000 mg | ORAL_TABLET | Freq: Every day | ORAL | 0 refills | Status: DC

## 2016-12-05 MED ORDER — PREDNISONE 20 MG PO TABS: ORAL_TABLET | ORAL | 0 refills | Status: DC

## 2016-12-05 NOTE — Progress Notes (Signed)
   HPI  Patient presents today here with cough and cold.  Patient explains that she was ill about 2 weeks ago and treated for influenza at urgent care. Her test was negative at that time. She took Tamiflu and had complete resolution of symptoms for about one week. She was then seen a few days ago in our clinic and felt to have viral respiratory infection.  Since that time she feels her symptoms have gotten much worse.  She has progressive dyspnea, malaise, and chills.  He has a severe productive cough of green sputum. She also has headache, dizziness. Her muscle aches have not returned since the first episode of influenza.   PMH: Smoking status noted ROS: Per HPI  Objective: BP 116/78   Pulse 94   Temp 99.2 F (37.3 C) (Oral)   Ht 5\' 5"  (1.651 m)   Wt 210 lb 9.6 oz (95.5 kg)   BMI 35.05 kg/m  Gen: NAD, alert, cooperative with exam HEENT: NCAT, oropharynx moist and clear, TMs erythematous but ossicles still visible. CV: RRR, good S1/S2, no murmur Resp: Unlabored with good air movement, left side with coarse breath sounds in the mid lung field Ext: No edema, warm Neuro: Alert and oriented, No gross deficits  Assessment and plan:  # Anemia car pneumonia Based on worsening shortness of breath and systemic symptoms, combined with lung findings I believe she is has developing pneumonia Treatment Levaquin With severe cough and likely bronchitic changes I have also added prednisone Supportive care discussed, lesion will course of illness scuffs Return to clinic with any concerns.     Meds ordered this encounter  Medications  . levofloxacin (LEVAQUIN) 500 MG tablet    Sig: Take 1 tablet (500 mg total) by mouth daily.    Dispense:  7 tablet    Refill:  0  . predniSONE (DELTASONE) 20 MG tablet    Sig: 2 po at same time daily for 5 days    Dispense:  10 tablet    Refill:  0    Murtis SinkSam Wren Gallaga, MD [facility] 12/05/2016, 3:28 PM

## 2016-12-05 NOTE — Patient Instructions (Signed)
Great to meet you!    Community-Acquired Pneumonia, Adult Pneumonia is an infection of the lungs. There are different types of pneumonia. One type can develop while a person is in a hospital. A different type, called community-acquired pneumonia, develops in people who are not, or have not recently been, in the hospital or other health care facility. What are the causes? Pneumonia may be caused by bacteria, viruses, or funguses. Community-acquired pneumonia is often caused by Streptococcus pneumonia bacteria. These bacteria are often passed from one person to another by breathing in droplets from the cough or sneeze of an infected person. What increases the risk? The condition is more likely to develop in:  People who havechronic diseases, such as chronic obstructive pulmonary disease (COPD), asthma, congestive heart failure, cystic fibrosis, diabetes, or kidney disease.  People who haveearly-stage or late-stage HIV.  People who havesickle cell disease.  People who havehad their spleen removed (splenectomy).  People who havepoor dental hygiene.  People who havemedical conditions that increase the risk of breathing in (aspirating) secretions their own mouth and nose.  People who havea weakened immune system (immunocompromised).  People who smoke.  People whotravel to areas where pneumonia-causing germs commonly exist.  People whoare around animal habitats or animals that have pneumonia-causing germs, including birds, bats, rabbits, cats, and farm animals.  What are the signs or symptoms? Symptoms of this condition include:  Adry cough.  A wet (productive) cough.  Fever.  Sweating.  Chest pain, especially when breathing deeply or coughing.  Rapid breathing or difficulty breathing.  Shortness of breath.  Shaking chills.  Fatigue.  Muscle aches.  How is this diagnosed? Your health care provider will take a medical history and perform a physical exam. You  may also have other tests, including:  Imaging studies of your chest, including X-rays.  Tests to check your blood oxygen level and other blood gases.  Other tests on blood, mucus (sputum), fluid around your lungs (pleural fluid), and urine.  If your pneumonia is severe, other tests may be done to identify the specific cause of your illness. How is this treated? The type of treatment that you receive depends on many factors, such as the cause of your pneumonia, the medicines you take, and other medical conditions that you have. For most adults, treatment and recovery from pneumonia may occur at home. In some cases, treatment must happen in a hospital. Treatment may include:  Antibiotic medicines, if the pneumonia was caused by bacteria.  Antiviral medicines, if the pneumonia was caused by a virus.  Medicines that are given by mouth or through an IV tube.  Oxygen.  Respiratory therapy.  Although rare, treating severe pneumonia may include:  Mechanical ventilation. This is done if you are not breathing well on your own and you cannot maintain a safe blood oxygen level.  Thoracentesis. This procedureremoves fluid around one lung or both lungs to help you breathe better.  Follow these instructions at home:  Take over-the-counter and prescription medicines only as told by your health care provider. ? Only takecough medicine if you are losing sleep. Understand that cough medicine can prevent your body's natural ability to remove mucus from your lungs. ? If you were prescribed an antibiotic medicine, take it as told by your health care provider. Do not stop taking the antibiotic even if you start to feel better.  Sleep in a semi-upright position at night. Try sleeping in a reclining chair, or place a few pillows under your head.  Do   not use tobacco products, including cigarettes, chewing tobacco, and e-cigarettes. If you need help quitting, ask your health care provider.  Drink  enough water to keep your urine clear or pale yellow. This will help to thin out mucus secretions in your lungs. How is this prevented? There are ways that you can decrease your risk of developing community-acquired pneumonia. Consider getting a pneumococcal vaccine if:  You are older than 30 years of age.  You are older than 30 years of age and are undergoing cancer treatment, have chronic lung disease, or have other medical conditions that affect your immune system. Ask your health care provider if this applies to you.  There are different types and schedules of pneumococcal vaccines. Ask your health care provider which vaccination option is best for you. You may also prevent community-acquired pneumonia if you take these actions:  Get an influenza vaccine every year. Ask your health care provider which type of influenza vaccine is best for you.  Go to the dentist on a regular basis.  Wash your hands often. Use hand sanitizer if soap and water are not available.  Contact a health care provider if:  You have a fever.  You are losing sleep because you cannot control your cough with cough medicine. Get help right away if:  You have worsening shortness of breath.  You have increased chest pain.  Your sickness becomes worse, especially if you are an older adult or have a weakened immune system.  You cough up blood. This information is not intended to replace advice given to you by your health care provider. Make sure you discuss any questions you have with your health care provider. Document Released: 10/07/2005 Document Revised: 02/15/2016 Document Reviewed: 02/01/2015 Elsevier Interactive Patient Education  2017 Elsevier Inc[employer]  

## 2016-12-10 NOTE — Telephone Encounter (Signed)
PT NOTIFIED   Will come and pick up

## 2016-12-12 ENCOUNTER — Encounter: Payer: Self-pay | Admitting: Pediatrics

## 2016-12-12 ENCOUNTER — Ambulatory Visit (INDEPENDENT_AMBULATORY_CARE_PROVIDER_SITE_OTHER): Payer: PRIVATE HEALTH INSURANCE | Admitting: Pediatrics

## 2016-12-12 VITALS — BP 108/76 | HR 71 | Temp 98.4°F | Ht 65.0 in | Wt 210.2 lb

## 2016-12-12 DIAGNOSIS — F331 Major depressive disorder, recurrent, moderate: Secondary | ICD-10-CM | POA: Diagnosis not present

## 2016-12-12 DIAGNOSIS — G932 Benign intracranial hypertension: Secondary | ICD-10-CM

## 2016-12-12 DIAGNOSIS — Z6834 Body mass index (BMI) 34.0-34.9, adult: Secondary | ICD-10-CM | POA: Diagnosis not present

## 2016-12-12 NOTE — Patient Instructions (Signed)
Let me know if you havent heard about nutrition referral in the next week

## 2016-12-12 NOTE — Progress Notes (Signed)
  Subjective:   Patient ID: Veronica Hendrix, female    DOB: 1986-08-02, 31 y.o.   MRN: [id number] CC: Follow-up (3 month) multiple med problems HPI: Veronica Hendrix is a 31 y.o. female presenting for Follow-up (3 month)  Son still waking up at night to eat Has been out of work several times with personal illness Sleeping fine Taking atarax in the morning Sertraline has helped her stay more calm Following with psychiatry  H/o papilledema: On diamox for pseudotumor cerebri  Taking valium for back spasms, needing rarely Started after LP  Elevated BMI: Not exercising much Doesn't like breakfast food, usually eats lunch and dinner 5yo austic son is very picky eater On adderall, eats minimally during the day, wakes up a lot at night to eat, making her schedule tough but she says she is managing  Relevant past medical, surgical, family and social history reviewed. Allergies and medications reviewed and updated. History  Smoking Status  . Never Smoker  Smokeless Tobacco  . Never Used   ROS: Per HPI   Objective:    BP 108/76   Pulse 71   Temp 98.4 F (36.9 C) (Oral)   Ht 5\' 5"  (1.651 m)   Wt 210 lb 3.2 oz (95.3 kg)   BMI 34.98 kg/m   Wt Readings from Last 3 Encounters:  12/12/16 210 lb 3.2 oz (95.3 kg)  12/05/16 210 lb 9.6 oz (95.5 kg)  12/02/16 210 lb (95.3 kg)    Gen: NAD, alert, cooperative with exam, NCAT EYES: EOMI, no conjunctival injection, or no icterus ENT:  TMs pearly gray b/l, OP without erythema LYMPH: no cervical LAD CV: NRRR, normal S1/S2, no murmur, distal pulses 2+ b/l Resp: CTABL, no wheezes, normal WOB Abd: +BS, soft, NTND. no guarding or organomegaly Ext: No edema, warm Neuro: Alert and oriented  Assessment & Plan:  Veronica Hendrix was seen today for follow-up.  Diagnoses and all orders for this visit:  Major depressive disorder, recurrent episode, moderate (HCC) Improving symptoms, feels able to care for self and son, working now Following with  psychiatry On sertraline  Idiopathic intracranial hypertension Headaches on Continues on diamox Follow up as scheduled with neurology  BMI 34.0-34.9,adult Discussed lifestyle changes, getting outside with son to park No snacking Eating three meals daily including breakfast  Follow up plan: Return in about 6 months (around 06/11/2017). Veronica Krasarol Veronica Overbaugh, MD [facility]
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
## 2016-12-17 ENCOUNTER — Ambulatory Visit (HOSPITAL_COMMUNITY): Payer: Self-pay | Admitting: Psychiatry

## 2016-12-26 ENCOUNTER — Encounter: Payer: Self-pay | Admitting: Neurology

## 2016-12-26 ENCOUNTER — Ambulatory Visit (INDEPENDENT_AMBULATORY_CARE_PROVIDER_SITE_OTHER): Payer: PRIVATE HEALTH INSURANCE | Admitting: Neurology

## 2016-12-26 VITALS — BP 141/102 | HR 76 | Resp 18 | Ht 65.0 in | Wt 210.0 lb

## 2016-12-26 DIAGNOSIS — H4710 Unspecified papilledema: Secondary | ICD-10-CM

## 2016-12-26 DIAGNOSIS — G4719 Other hypersomnia: Secondary | ICD-10-CM | POA: Diagnosis not present

## 2016-12-26 DIAGNOSIS — G932 Benign intracranial hypertension: Secondary | ICD-10-CM | POA: Diagnosis not present

## 2016-12-26 DIAGNOSIS — G4733 Obstructive sleep apnea (adult) (pediatric): Secondary | ICD-10-CM

## 2016-12-26 MED ORDER — PHENTERMINE HCL 15 MG PO CAPS: 15.0000 mg | ORAL_CAPSULE | ORAL | 5 refills | Status: DC

## 2016-12-26 MED ORDER — CYCLOBENZAPRINE HCL 5 MG PO TABS: 5.0000 mg | ORAL_TABLET | Freq: Three times a day (TID) | ORAL | 3 refills | Status: DC | PRN

## 2016-12-26 MED ORDER — ACETAZOLAMIDE 250 MG PO TABS: 250.0000 mg | ORAL_TABLET | Freq: Three times a day (TID) | ORAL | 5 refills | Status: DC

## 2016-12-26 NOTE — Progress Notes (Signed)
[facility]  PATIENT: Veronica Hendrix DOB: [birth year]  REFERRING DOCTOR OR PCP:  Dr. Conley Rolls ([facility]; Fax [phone]); has appt to see new PCP [name] phone [phone]) SOURCE: patient, notes from Dr. Conley Rolls.    _________________________________   HISTORICAL  CHIEF COMPLAINT:  Chief Complaint  Patient presents with  . Pseudotumor Cerebri    Sts. b/c of tinniuts, tingling in hands and lips, she decreased Diamox to 500mg  daily, which she is tolerating better.  Sts. h/a's have been worse over the last 10 days or so./fim    HISTORY OF PRESENT ILLNESS:  Veronica Hendrix is a 30 year old woman with hreadaches and papilledema.     IIH:  She had trouble tolerating Diamox 500 mg twice a day and cut back to 500 mg daily. Although she had less tinnitus and tingling on the lower dose, her headaches returned.  IIH history:   She underwent a lumbar puncture 09/18/16 and opening pressure was elevated at 34 cm consistent with idiopathic intracranial hypertension. Unfortunately, she had a positional headache afterwards and return for a blood patch 09/23/2016.     Headaches/vision:  These are doing better since the LP 2 weeks ago.   Vision changes are also better.  Leg pain/hip pain:   One day after the blood patch 09/23/16, she has note LBP, hip pain and leg pain.   He notes that the back pain is still occurring.   OSA?:   She also notes difficulties with sleepiness she snores but nobody has commented if she has pauses in her breathing or gasping.   Her Epworth Sleepiness Scale score showed moderate sleepiness, 15/24 at the initial visit.   She weighs about 50 pounds more now than she did 4 years ago before her pregnancy.    REVIEW OF SYSTEMS: Constitutional: No fevers, chills, sweats, or change in appetite Eyes: Nas above Ear, nose and throat: No hearing loss, ear pain, nasal congestion, sore throat Cardiovascular: No chest pain, palpitations Respiratory: No  shortness of breath at rest or with exertion.   No wheezes.   She snores and has some OSA signs.   GastrointestinaI: No nausea, vomiting, diarrhea, abdominal pain, fecal incontinence Genitourinary: No dysuria, urinary retention or frequency.  No nocturia. Musculoskeletal: No neck pain, back pain Integumentary: No rash, pruritus, skin lesions Neurological: as above Psychiatric: No depression at this time.  No anxiety Endocrine: No palpitations, diaphoresis, change in appetite, change in weigh or increased thirst Hematologic/Lymphatic: No anemia, purpura, petechiae. Allergic/Immunologic: No itchy/runny eyes, nasal congestion, recent allergic reactions, rashes  ALLERGIES: No Known Allergies  HOME MEDICATIONS:  Current Outpatient Prescriptions:  .  acetaminophen (TYLENOL) 500 MG tablet, Take 1,000 mg by mouth every 6 (six) hours as needed for moderate pain. , Disp: , Rfl:  .  aspirin 500 MG EC tablet, Take 500 mg by mouth every 6 (six) hours as needed for pain., Disp: , Rfl:  .  Brexpiprazole (REXULTI) 0.5 MG TABS, Take 1 tablet (0.5 mg total) by mouth daily., Disp: 30 tablet, Rfl: 0 .  diazepam (VALIUM) 5 MG tablet, Take 1 tablet (5 mg total) by mouth every 6 (six) hours as needed for anxiety. Take one pill up to 3 times a day for back spasms, Disp: 20 tablet, Rfl: 0 .  hydrOXYzine (ATARAX/VISTARIL) 10 MG tablet, Take 1 tablet (10 mg total) by mouth 2 (two) times daily as needed., Disp: 60 tablet, Rfl: 0 .  ibuprofen (ADVIL,MOTRIN) 200 MG tablet, Take 800 mg by mouth  every 6 (six) hours as needed., Disp: , Rfl:  .  norethindrone-ethinyl estradiol 1/35 (ORTHO-NOVUM, NORTREL,CYCLAFEM) tablet, Take 1 tablet by mouth daily., Disp: , Rfl:  .  sertraline (ZOLOFT) 100 MG tablet, Take 1 tablet (100 mg total) by mouth daily., Disp: 30 tablet, Rfl: 0 .  tetrahydrozoline (V-R EYE DROPS) 0.05 % ophthalmic solution, Place 1 drop into both eyes 3 (three) times daily as needed (for dry eyes)., Disp: , Rfl:   .  acetaZOLAMIDE (DIAMOX) 250 MG tablet, Take 1 tablet (250 mg total) by mouth 3 (three) times daily., Disp: 90 tablet, Rfl: 5 .  cyclobenzaprine (FLEXERIL) 5 MG tablet, Take 1 tablet (5 mg total) by mouth every 8 (eight) hours as needed for muscle spasms., Disp: 90 tablet, Rfl: 3 .  phentermine 15 MG capsule, Take 1 capsule (15 mg total) by mouth every morning., Disp: 30 capsule, Rfl: 5  PAST MEDICAL HISTORY: Past Medical History:  Diagnosis Date  . Bipolar 1 disorder (HCC)   . Headache   . HPV in female   . Panic attacks   . Post traumatic stress disorder (PTSD)   . Vision abnormalities     PAST SURGICAL HISTORY: Past Surgical History:  Procedure Laterality Date  . BLOOD PATCH      FAMILY HISTORY: Family History  Problem Relation Age of Onset  . Diabetes Mother   . Mental illness Father   . Hypertension Father   . Heart disease Maternal Grandmother   . Heart disease Maternal Grandfather   . Heart disease Paternal Grandfather     SOCIAL HISTORY:  Social History   Social History  . Marital status: Legally Separated    Spouse name: N/A  . Number of children: N/A  . Years of education: N/A   Occupational History  . Not on file.   Social History Main Topics  . Smoking status: Never Smoker  . Smokeless tobacco: Never Used  . Alcohol use No     Comment: 10-03-2016 occa.  . Drug use: No     Comment: 10-03-2016 per pt no   . Sexual activity: Yes    Birth control/ protection: Inserts     Comment: Mirena   Other Topics Concern  . Not on file   Social History Narrative  . No narrative on file     PHYSICAL EXAM  Vitals:   12/26/16 1608  BP: (!) 141/102  Pulse: 76  Resp: 18  Weight: 210 lb (95.3 kg)  Height: 5\' 5"  (1.651 m)    Body mass index is 34.95 kg/m.   General: The patient is well-developed and well-nourished and in no acute distress  Eyes:  Funduscopic exam shows mild papilledema OD and OS looks good  Neurologic Exam  Mental status:  The patient is alert and oriented x 3 at the time of the examination. The patient has apparent normal recent and remote memory, with an apparently normal attention span and concentration ability.   Speech is normal.  Cranial nerves: Extraocular movements are full. Pupils are equal, round, and reactive to light and accomodation.    Facial symmetry is present. There is good facial sensation to soft touch bilaterally.Facial strength is normal.  Trapezius and sternocleidomastoid strength is normal. No dysarthria is noted.  The tongue is midline, and the patient has symmetric elevation of the soft palate. No obvious hearing deficits are noted.  Motor:  Muscle bulk is normal.   Tone is normal. Strength is  5 / 5 in all 4 extremities.  Sensory: Sensory testing is intact to touch in all 4 extremities.  Coordination: Cerebellar testing reveals good finger-nose-finger and heel-to-shin bilaterally.  Gait and station: Station is normal.   Gait is arthritic. Tandem gait is normal. Romberg is negative.   Reflexes: Deep tendon reflexes are symmetric and normal bilaterally.       DIAGNOSTIC DATA (LABS, IMAGING, TESTING) - I reviewed patient records, labs, notes, testing and imaging myself where available.     ASSESSMENT AND PLAN  Idiopathic intracranial hypertension  Papilledema of both eyes  Excessive daytime sleepiness  OSA (obstructive sleep apnea)   1.   Continue Diamox but change to 250 mg tid to try to reduce s.e.    2.   Try to lose weight, phentermine 15 mg daily to help curb appetite.   3   If unable to lose weight, will need a PSG to assess for OSA 4.   Will need to follow-up with ophthalmology sometime in the next couple of months for repeat eye exam and visual field testing. 5.   Flexeril as needed for the back pain. rtc 4 months, sooner if new or worsening problems   Richard A. Epimenio Foot, MD, PhD 12/26/2016, 5:56 PM Certified in Neurology, Clinical Neurophysiology, Sleep Medicine,  Pain Medicine and Neuroimaging  [facility] [address], [address] [place], [place] [address] [phone]

## 2016-12-30 ENCOUNTER — Telehealth (HOSPITAL_COMMUNITY): Payer: Self-pay | Admitting: *Deleted

## 2016-12-30 NOTE — Telephone Encounter (Signed)
Called number on file to sch appt for pt. Per message from Esmeralda LinksKim Grice to sch pt. Per Selena BattenKim, [facility] due to concern with pt cutting has had two friends commit suicide recently. RMA called [facility] and spoke with Neysa Bonitohristy, NP. Per Coal[place]risty, she did not see any open wounds or cut on pt during her f/u visit. Per Ketteringhristy, pt had cut herself in the past but is not currently.actively cutting. Per Neysa Bonitohristy, pt was a new pt to her and started pt on Lexapro and wanted office to pt to see a psychiatrist to monitor her and evaluate her. Asked Neysa BonitoChristy if this was an emergency or if she thought pt was suicidal and per Diablockhristy but she just want pt to be seen as soon as possible. Called pt mother with number on file and could not reach her. lmtcb and office number provided on mobile phone. Called home number and a female picked up stating pt mother was not home and there was no other number to get a hold of pt mother. Informed female office will call back at another time and he verbalized understating.

## 2017-01-01 ENCOUNTER — Ambulatory Visit (INDEPENDENT_AMBULATORY_CARE_PROVIDER_SITE_OTHER): Payer: PRIVATE HEALTH INSURANCE | Admitting: Psychiatry

## 2017-01-01 ENCOUNTER — Encounter (HOSPITAL_COMMUNITY): Payer: Self-pay | Admitting: Psychiatry

## 2017-01-01 DIAGNOSIS — F331 Major depressive disorder, recurrent, moderate: Secondary | ICD-10-CM

## 2017-01-01 DIAGNOSIS — F411 Generalized anxiety disorder: Secondary | ICD-10-CM

## 2017-01-01 DIAGNOSIS — Z818 Family history of other mental and behavioral disorders: Secondary | ICD-10-CM | POA: Diagnosis not present

## 2017-01-01 MED ORDER — BREXPIPRAZOLE 0.5 MG PO TABS: 0.5000 mg | ORAL_TABLET | Freq: Every day | ORAL | 1 refills | Status: DC

## 2017-01-01 MED ORDER — HYDROXYZINE HCL 25 MG PO TABS: 25.0000 mg | ORAL_TABLET | Freq: Two times a day (BID) | ORAL | 1 refills | Status: DC | PRN

## 2017-01-01 MED ORDER — SERTRALINE HCL 100 MG PO TABS: 150.0000 mg | ORAL_TABLET | Freq: Every day | ORAL | 1 refills | Status: DC
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
## 2017-01-01 NOTE — Patient Instructions (Addendum)
1. Increase sertraline 150 mg daily 2. Continue Rexulti 0.5 mg daily 3. Increase hydroxyzine 25 mg twice a day as needed for anxiety 4. Return to clinic in one month 5. Contact for therapy: Dr. Daisy BlossomSara W Schneidmiller   [phone] [address], [place], [place] [id number]

## 2017-01-20 ENCOUNTER — Encounter: Payer: Self-pay | Admitting: Neurology

## 2017-01-20 ENCOUNTER — Telehealth: Payer: Self-pay | Admitting: *Deleted

## 2017-01-20 MED ORDER — PHENTERMINE HCL 30 MG PO CAPS: 30.0000 mg | ORAL_CAPSULE | ORAL | 5 refills | Status: DC

## 2017-01-20 NOTE — Telephone Encounter (Signed)
See email.  Pt. feels Phentermine  is no longer helping.  Per RAS, ok to increase to  once daily.  Rx. faxed to [facility]

## 2017-01-27 ENCOUNTER — Telehealth: Payer: Self-pay

## 2017-01-27 NOTE — Telephone Encounter (Signed)
Patient does not want to schedule sleep study 

## 2017-01-28 ENCOUNTER — Telehealth: Payer: Self-pay | Admitting: Neurology

## 2017-01-28 ENCOUNTER — Encounter: Payer: Self-pay | Admitting: Neurology

## 2017-01-28 ENCOUNTER — Encounter: Payer: Self-pay | Admitting: Pediatrics

## 2017-01-28 NOTE — Telephone Encounter (Signed)
Have already responded to pt. via MyChart/fim
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
## 2017-01-28 NOTE — Telephone Encounter (Signed)
**  I was unable to addend previous entry** Pt calling back because she sent Dr Epimenio Foot a message through Mychart and has not received a response, she would very much like to hear back asap.

## 2017-01-29 ENCOUNTER — Ambulatory Visit (INDEPENDENT_AMBULATORY_CARE_PROVIDER_SITE_OTHER): Payer: PRIVATE HEALTH INSURANCE | Admitting: Nurse Practitioner

## 2017-01-29 ENCOUNTER — Encounter: Payer: Self-pay | Admitting: Nurse Practitioner

## 2017-01-29 ENCOUNTER — Telehealth: Payer: Self-pay | Admitting: *Deleted

## 2017-01-29 VITALS — BP 127/87 | HR 85 | Wt 207.2 lb

## 2017-01-29 DIAGNOSIS — G932 Benign intracranial hypertension: Secondary | ICD-10-CM

## 2017-01-29 DIAGNOSIS — H539 Unspecified visual disturbance: Secondary | ICD-10-CM | POA: Diagnosis not present

## 2017-01-29 DIAGNOSIS — R51 Headache: Secondary | ICD-10-CM

## 2017-01-29 DIAGNOSIS — H4710 Unspecified papilledema: Secondary | ICD-10-CM

## 2017-01-29 DIAGNOSIS — R519 Headache, unspecified: Secondary | ICD-10-CM

## 2017-01-29 MED ORDER — ACETAZOLAMIDE 250 MG PO TABS: 500.0000 mg | ORAL_TABLET | Freq: Two times a day (BID) | ORAL | 6 refills | Status: DC

## 2017-01-29 NOTE — Progress Notes (Signed)
I have read the note, and I agree with the clinical assessment.   I received the office notes for her ophthalmologist visit.  Visual fields have worsened therefore, we will have her get another lumbar puncture since the increase in Diamox may not be enough to help.  Richard A. Epimenio Foot, MD, PhD Certified in Neurology, Clinical Neurophysiology, Sleep Medicine, Pain Medicine and Neuroimaging  [facility] [address], [address] [place], [place] [address] [phone]

## 2017-01-29 NOTE — Telephone Encounter (Signed)
I have spoken with Veronica Hendrix again today and advised that I spoke with Veronica Hendrix at [facility].  They can do her LP tomorrow am at 11am, arrival time of 1045.  No solids 4 hrs. prior to LP, but liquids and meds are ok, as she is not on any blood thinners.  She must have a driver.  Veronica Hendrix verbalized understanding of all of above, is agreeable with this plan/fm

## 2017-01-29 NOTE — Progress Notes (Signed)
[facility]  PATIENT: Veronica Hendrix DOB: 1986/04/30   REASON FOR VISIT: Follow-up for idiopathic intracranial hypertension, headaches HISTORY FROM: Patient and Mom    HISTORY OF PRESENT ILLNESS:UPDATE 04/11/2018CM Ms. 58, 31 year old female returns for follow-up after having increasing headache and pain over the  left eye for 4 days. She saw her ophthalmologist yesterday who told her her papilledema was worse and now involves both eyes. She did not bring the report in. 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 She stayed out of work one day for her headache. She is currently on Diamox  3  tablets daily. She was placed on phentermine in attempt to lose weight however there has been little weight loss. In addition she probably has obstructive sleep apnea due to her body habitus and excessive snoring. She returns for reevaluation   12/26/16 Dr. Alfonzo Beers is a 31 year old woman with hreadaches and papilledema.     IIH:  She had trouble tolerating Diamox 500 mg twice a day and cut back to 500 mg daily. Although she had less tinnitus and tingling on the lower dose, her headaches returned.  IIH history:   She underwent a lumbar puncture 09/18/16 and opening pressure was elevated at 34 cm consistent with idiopathic intracranial hypertension. Unfortunately, she had a positional headache afterwards and return for a blood patch 09/23/2016.     Headaches/vision:  These are doing better since the LP 2 weeks ago.   Vision changes are also better.  Leg pain/hip pain:   One day after the blood patch 09/23/16, she has note LBP, hip pain and leg pain.   He notes that the back pain is still occurring.   OSA?:   She also notes difficulties with sleepiness she snores but nobody has commented if she has pauses in her breathing or gasping.   Her Epworth Sleepiness Scale score showed moderate sleepiness, 15/24 at the initial visit.   She weighs about 50 pounds more now than  she did 4 years ago before her pregnancy.    REVIEW OF SYSTEMS: Full 14 system review of systems performed and notable only for those listed, all others are neg:  Constitutional: neg  Cardiovascular: neg Ear/Nose/Throat: neg  Skin: neg Eyes: Light sensitivity, blurred vision Respiratory: neg Gastroitestinal: neg  Hematology/Lymphatic: neg  Endocrine: neg Musculoskeletal:neg Allergy/Immunology: neg Neurological: Headache Psychiatric: Depression and anxiety Sleep : Snoring   ALLERGIES: No Known Allergies  HOME MEDICATIONS: Outpatient Medications Prior to Visit  Medication Sig Dispense Refill  . acetaminophen (TYLENOL) 500 MG tablet Take 1,000 mg by mouth every 6 (six) hours as needed for moderate pain.     Marland Kitchen acetaZOLAMIDE (DIAMOX) 250 MG tablet Take 1 tablet (250 mg total) by mouth 3 (three) times daily. 90 tablet 5  . aspirin 500 MG EC tablet Take 500 mg by mouth every 6 (six) hours as needed for pain.    . Brexpiprazole (REXULTI) 0.5 MG TABS Take 1 tablet (0.5 mg total) by mouth daily. 30 tablet 1  . cyclobenzaprine (FLEXERIL) 5 MG tablet Take 1 tablet (5 mg total) by mouth every 8 (eight) hours as needed for muscle spasms. 90 tablet 3  . hydrOXYzine (ATARAX/VISTARIL) 25 MG tablet Take 1 tablet (25 mg total) by mouth 2 (two) times daily as needed for anxiety. 60 tablet 1  . ibuprofen (ADVIL,MOTRIN) 200 MG tablet Take 800 mg by mouth every 6 (six) hours as needed.    . norethindrone-ethinyl estradiol 1/35 (ORTHO-NOVUM, NORTREL,CYCLAFEM) tablet Take  1 tablet by mouth daily.    . phentermine 30 MG capsule Take 1 capsule (30 mg total) by mouth every morning. 30 capsule 5  . sertraline (ZOLOFT) 100 MG tablet Take 1.5 tablets (150 mg total) by mouth daily. 45 tablet 1  . tetrahydrozoline (V-R EYE DROPS) 0.05 % ophthalmic solution Place 1 drop into both eyes 3 (three) times daily as needed (for dry eyes).    . diazepam (VALIUM) 5 MG tablet Take 1 tablet (5 mg total) by mouth every 6  (six) hours as needed for anxiety. Take one pill up to 3 times a day for back spasms (Patient not taking: Reported on 01/29/2017) 20 tablet 0   No facility-administered medications prior to visit.     PAST MEDICAL HISTORY: Past Medical History:  Diagnosis Date  . Bipolar 1 disorder (HCC)   . Headache   . HPV in female   . Panic attacks   . Post traumatic stress disorder (PTSD)   . Vision abnormalities     PAST SURGICAL HISTORY: Past Surgical History:  Procedure Laterality Date  . BLOOD PATCH      FAMILY HISTORY: Family History  Problem Relation Age of Onset  . Diabetes Mother   . Mental illness Father   . Hypertension Father   . Heart disease Maternal Grandmother   . Heart disease Maternal Grandfather   . Heart disease Paternal Grandfather     SOCIAL HISTORY: Social History   Social History  . Marital status: Legally Separated    Spouse name: N/A  . Number of children: N/A  . Years of education: N/A   Occupational History  . Not on file.   Social History Main Topics  . Smoking status: Never Smoker  . Smokeless tobacco: Never Used  . Alcohol use No     Comment: 10-03-2016 occa.  . Drug use: No     Comment: 10-03-2016 per pt no   . Sexual activity: Yes    Birth control/ protection: Inserts     Comment: Mirena   Other Topics Concern  . Not on file   Social History Narrative  . No narrative on file     PHYSICAL EXAM  Vitals:   01/29/17 0806  BP: 127/87  Pulse: 85  Weight: 207 lb 3.2 oz (94 kg)   Body mass index is 34.48 kg/m.  Generalized: Well developed, Obese female in no acute distress  Head: normocephalic and atraumatic,. Oropharynx benign  Neck: Supple,   Musculoskeletal: No deformity   Neurological examination   Mentation: Alert oriented to time, place, history taking. Attention span and concentration appropriate. Recent and remote memory intact.  Follows all commands speech and language fluent.   Cranial nerve II-XII: Fundoscopic  exam reveals papilledema bilateral Left >right.visual acuity 20/50 right 20/70 left Pupils were equal round reactive to light extraocular movements were full, visual field were full on confrontational test. Facial sensation and strength were normal. hearing was intact to finger rubbing bilaterally. Uvula tongue midline. head turning and shoulder shrug were normal and symmetric.Tongue protrusion into cheek strength was normal. Motor: normal bulk and tone, full strength in the BUE, BLE, fine finger movements normal, no pronator drift. No focal weakness Sensory: normal and symmetric to light touch, pinprick, and  Vibration, in the upper and lower extremities Coordination: finger-nose-finger, heel-to-shin bilaterally, no dysmetria Reflexes: Symmetric upper and lower, plantar responses were flexor bilaterally. Gait and Station: Rising up from seated position without assistance, normal stance,  moderate stride, good arm swing,  smooth turning, able to perform tiptoe, and heel walking without difficulty. Tandem gait is steady  DIAGNOSTIC DATA (LABS, IMAGING, TESTING) - I reviewed patient records, labs, notes, testing and imaging myself where available.  Lab Results  Component Value Date   HGB 14.3 09/22/2016   HCT 42.0 09/22/2016      Component Value Date/Time   NA 144 09/22/2016 2045   NA 139 08/22/2016 0948   K 4.0 09/22/2016 2045   CL 110 09/22/2016 2045   CO2 25 08/22/2016 0948   GLUCOSE 110 (H) 09/22/2016 2045   BUN 17 09/22/2016 2045   BUN 14 08/22/2016 0948   CREATININE 1.00 09/22/2016 2045   CALCIUM 9.7 08/22/2016 0948   PROT 7.2 08/22/2016 0948   ALBUMIN 4.8 08/22/2016 0948   AST 23 08/22/2016 0948   ALT 35 (H) 08/22/2016 0948   ALKPHOS 84 08/22/2016 0948   BILITOT 0.5 08/22/2016 0948   GFRNONAA 88 08/22/2016 0948   GFRAA 101 08/22/2016 0948   Lab Results  Component Value Date   CHOL 174 08/22/2016   HDL 46 08/22/2016   LDLCALC 102 (H) 08/22/2016   TRIG 129 08/22/2016    CHOLHDL 3.8 08/22/2016    Lab Results  Component Value Date   TSH 1.920 08/22/2016      ASSESSMENT AND PLAN  31 y.o. year old female  has a past medical history of Bipolar 1 disorder (HCC); Headache; Idiopathic intracranial hypertension with papilledema or both eyes today left greater than right.  Discussed with Dr. Epimenio Foot Increase Diamox to  twice daily Follow up with Dr. Epimenio Foot in 6 weeks for repeat exam If headaches do not get better will need neuro surgeon consult for shunting as she can have loss of vision if this  continues.  I spent 25 min  in total face to face time with the patient more than 50% of which was spent counseling and coordination of care, reviewing test results reviewing medications and discussing and reviewing the diagnosis of idiopathic intracranial hypertension and shunting as a  treatment option. , Cline Crock, [facility], APRN  [facility] [address], [address] [place], [place] [address] [phone]

## 2017-01-29 NOTE — Patient Instructions (Addendum)
Increase Diamox to  twice daily Follow up with Dr. Epimenio Foot in 6 weeks  If headaches do not get better will need neuro surgeon consult for shunting   Ventriculoperitoneal Shunt Placement  A ventriculoperitoneal (VP) shunt is a small, plastic tube that is used to drain fluid from your brain into a sac in your belly (peritoneum). The peritoneum absorbs this fluid and gets rid of it. Normally, your brain releases the fluid that cushions your brain and spine (cerebrospinal fluid, CSF). The brain then reabsorbs it through drainage channels. If your brain's drainage channels are not working properly, fluid builds up in your brain and needs to be redirected with a shunt. You may need a VP shunt if you have too much CSF inside your brain (hydrocephalus). Your health care provider determines how much fluid needs to be drained and adjusts the settings on the shunt. Some shunt settings cannot be changed after they have been set (nonprogrammable shunt). Others can be adjusted (programmable shunt) by your health care provider. Tell a health care provider about:  Any allergies you have.  All medicines you are taking, including vitamins, herbs, eye drops, creams, and over-the-counter medicines.  Any problems you or family members have had with anesthetic medicines.  Any blood disorders you have, including history of blood clots or blood problems.  Any surgeries you have had.  Any medical conditions you have.  Past medical history, especially diabetes, seizure disorder, or previous abdominal surgery.  Any recent fevers, illness, or infections. What are the risks? Generally, this is a safe procedure. However, problems can occur and include:  Bleeding.  Blood clots.  Infection.  Brain damage.  Damage to structures inside the belly (abdomen).  Leaking or malfunction of the shunt.  Needing to replace or reposition the shunt. What happens before the procedure?  Ask your health care provider  about:  Changing or stopping your regular medicines. This is especially important if you are taking diabetes medicines or blood thinners.  Taking medicines such as aspirin and ibuprofen. These medicines can thin your blood. Do not take these medicines before your procedure if your health care provider instructs you not to.  Do not use any tobacco products, including cigarettes, chewing tobacco, or electronic cigarettes. If you need help quitting, talk to your health care provider.  Do not eat or drink anything after midnight on the night before the procedure or as directed by your health care provider. What happens during the procedure?  An IV tube will be inserted into one of your veins.  Through the IV tube, you will be given:  A medicine that makes you go to sleep (general anesthetic).  Antibiotic medicine to prevent infection.  You will have a breathing tube placed in your throat.  Your head, neck, chest, and abdomen will be cleaned with a germ-killing solution (antiseptic).  Hair may be removed from the area where the shunt will be inserted.  A small surgical cut (incision) will be made in your scalp, and a flap of scalp will be lifted to expose a small area of your skull.  A small hole will be drilled into your skull so that a plastic tube (catheter) can be placed into a pocket of fluid (ventricle).  A small incision will be made in your abdomen. Another catheter will be passed under the skin of your head, neck, and chest and through a hole into your abdomen.  Both catheters will be connected to a fluid pump that is placed behind your  ear. A valve in the pump will be opened to drain excess CSF from your brain into your abdomen.  The incisions will be closed with staples or stitches (sutures). What happens after the procedure?  After surgery, you will be taken to a recovery area.  Your blood pressure, heart rate, breathing rate, and blood oxygen level will be monitored  often until the medicines you were given have worn off.  You will continue to receive fluids and nutrition through your IV tube.  You will also get medicine through your IV tube. This includes:  Pain medicines as needed.  Antibiotics for 24 hours or longer to prevent infection.  You can begin to eat regular food after you start passing gas, which indicates that air is moving through your digestive tract. This information is not intended to replace advice given to you by your health care provider. Make sure you discuss any questions you have with your health care provider. Document Released: 09/19/2005 Document Revised: 03/14/2016 Document Reviewed: 03/16/2014 Elsevier Interactive Patient Education  2017 [employer].

## 2017-01-29 NOTE — Addendum Note (Signed)
Addended by: Candis Schatz I on: 01/29/2017 02:16 PM   Modules accepted: Orders

## 2017-01-29 NOTE — Telephone Encounter (Signed)
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 Notes just received from pt's opthalmologist at [facility], indicating moderate optic nerve edema, lack of venous pulsations, and enlarged blind spot. These notes were from pt's 01-28-17 visit with opthalmology.  Per RAS, pt. should have LP or NS consult for shunt placement.  I have spoken with Taheerah and she is agreeable with LP, as this will give the quickest relief of sx.  Order placed in EPIC for urgent LP/fim

## 2017-01-30 ENCOUNTER — Ambulatory Visit (HOSPITAL_COMMUNITY): Payer: Self-pay | Admitting: Psychiatry

## 2017-01-30 ENCOUNTER — Ambulatory Visit
Admission: RE | Admit: 2017-01-30 | Discharge: 2017-01-30 | Disposition: A | Discharge status: Home / Self Care | Payer: PRIVATE HEALTH INSURANCE | Source: Ambulatory Visit | Attending: Neurology | Admitting: Neurology

## 2017-01-30 DIAGNOSIS — G932 Benign intracranial hypertension: Secondary | ICD-10-CM

## 2017-01-30 NOTE — Discharge Instructions (Signed)

## 2017-01-31 ENCOUNTER — Telehealth: Payer: Self-pay | Admitting: *Deleted

## 2017-01-31 NOTE — Telephone Encounter (Signed)
I have spoken with Veronica Hendrix this morning, and per RAS, explained that csf pressure was much better this time--just a little high.  Hopefully the combo of LP and higher Diamox dose will provide better control of h/a's.  She verbalized understanding of same/fim

## 2017-01-31 NOTE — Telephone Encounter (Signed)
-----   Message from Asa Lente, MD sent at 01/30/2017  1:03 PM EDT ----- Please let her know that the spinal fluid pressure was much better this time than the initial lumbar puncture and is just slightly high now.  Hopefully, the combination of the LP and the higher dose of Diamox will keep things under control better.

## 2017-02-02 ENCOUNTER — Encounter: Payer: Self-pay | Admitting: Neurology

## 2017-02-03 ENCOUNTER — Encounter: Payer: Self-pay | Admitting: Neurology

## 2017-02-03 ENCOUNTER — Encounter: Payer: Self-pay | Admitting: Pediatrics

## 2017-02-04 ENCOUNTER — Telehealth: Payer: Self-pay | Admitting: Neurology

## 2017-02-04 ENCOUNTER — Other Ambulatory Visit: Payer: Self-pay | Admitting: Nurse Practitioner

## 2017-02-04 ENCOUNTER — Telehealth: Payer: Self-pay | Admitting: *Deleted

## 2017-02-04 ENCOUNTER — Ambulatory Visit
Admission: RE | Admit: 2017-02-04 | Discharge: 2017-02-04 | Disposition: A | Discharge status: Home / Self Care | Payer: PRIVATE HEALTH INSURANCE | Source: Ambulatory Visit | Attending: Nurse Practitioner | Admitting: Nurse Practitioner

## 2017-02-04 ENCOUNTER — Other Ambulatory Visit: Payer: Self-pay | Admitting: *Deleted

## 2017-02-04 VITALS — BP 129/84 | HR 71

## 2017-02-04 DIAGNOSIS — G971 Other reaction to spinal and lumbar puncture: Secondary | ICD-10-CM

## 2017-02-04 DIAGNOSIS — G932 Benign intracranial hypertension: Secondary | ICD-10-CM

## 2017-02-04 MED ORDER — IOPAMIDOL (ISOVUE-M 200) INJECTION 41%
1.0000 mL | Freq: Once | INTRAMUSCULAR | Status: AC
  Administered 2017-02-04: 1 mL via EPIDURAL

## 2017-02-04 NOTE — Discharge Instructions (Signed)

## 2017-02-04 NOTE — Telephone Encounter (Signed)
I spoke with Monteen.   She had another blood patch today. She thinks she felt better for about one day after the lumbar puncture before she got a different type of headache that was worse when she was sitting up.  Test with her that I am more concerned about further changes in vision than the headache.   She does not have a follow-up ophthalmology exam.  I would like her to come back to see me in about 2-3 weeks rather than July   We will reevaluate the edema at that time.
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
## 2017-02-05 ENCOUNTER — Telehealth (HOSPITAL_COMMUNITY): Payer: Self-pay | Admitting: *Deleted
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
## 2017-02-05 NOTE — Telephone Encounter (Signed)
Pt pharmacy [facility] requesting 90 days supply for pt Hydroxyzine HCL 25 mg BID PRN. Pt medication was last filled on 01-01-2017 with 60 tabs 1 refill. Pt pharmacy number is [phone].

## 2017-02-05 NOTE — Telephone Encounter (Signed)
Pt pharmacy [facility] requesting 90 days supply for pt Sertraline HCL 100 mg QD. Pt medication was last filled on 01-01-2017 with 45 tabs 1 refills. Pt pharmacy number is [phone].

## 2017-02-05 NOTE — Telephone Encounter (Signed)
Please contact the patient if she still has these (hydroxyzine, sertraline) medication left until tomorrow; if she has, we can discuss about this request at her tomorrow's appointment.

## 2017-02-05 NOTE — Telephone Encounter (Signed)
I have spoken with Corley this morning and given appt. for 02/24/17 at 1330.  She will call back if h/a or vision worsen before then/fim

## 2017-02-06 ENCOUNTER — Encounter: Payer: Self-pay | Admitting: Neurology

## 2017-02-06 ENCOUNTER — Telehealth: Payer: Self-pay | Admitting: Pediatrics

## 2017-02-06 ENCOUNTER — Encounter: Payer: Self-pay | Admitting: Pediatrics

## 2017-02-06 ENCOUNTER — Encounter (HOSPITAL_COMMUNITY): Payer: Self-pay | Admitting: Psychiatry

## 2017-02-06 ENCOUNTER — Ambulatory Visit (INDEPENDENT_AMBULATORY_CARE_PROVIDER_SITE_OTHER): Payer: PRIVATE HEALTH INSURANCE | Admitting: Psychiatry

## 2017-02-06 DIAGNOSIS — Z7982 Long term (current) use of aspirin: Secondary | ICD-10-CM

## 2017-02-06 DIAGNOSIS — F411 Generalized anxiety disorder: Secondary | ICD-10-CM

## 2017-02-06 DIAGNOSIS — Z79899 Other long term (current) drug therapy: Secondary | ICD-10-CM | POA: Diagnosis not present

## 2017-02-06 DIAGNOSIS — Z818 Family history of other mental and behavioral disorders: Secondary | ICD-10-CM | POA: Diagnosis not present

## 2017-02-06 DIAGNOSIS — F331 Major depressive disorder, recurrent, moderate: Secondary | ICD-10-CM | POA: Diagnosis not present

## 2017-02-06 MED ORDER — SERTRALINE HCL 100 MG PO TABS: 150.0000 mg | ORAL_TABLET | Freq: Every day | ORAL | 0 refills | Status: DC

## 2017-02-06 MED ORDER — HYDROXYZINE HCL 25 MG PO TABS: 25.0000 mg | ORAL_TABLET | Freq: Every day | ORAL | 0 refills | Status: DC | PRN

## 2017-02-06 MED ORDER — BREXPIPRAZOLE 0.5 MG PO TABS: 0.5000 mg | ORAL_TABLET | Freq: Every day | ORAL | 0 refills | Status: DC

## 2017-02-06 NOTE — Patient Instructions (Addendum)
1 Continue sertraline 150 mg daily 2. Continue Rexulti 0.5 mg daily 3. Cotniue hydroxyzine 25 mg once to twice a day as needed for anxiety 4. Return to clinic in one month 5. Contact for therapy: Dr. Daisy Blossom [facility]idmiller   [phone] [address] [place], [place] [address]

## 2017-02-07 ENCOUNTER — Ambulatory Visit: Payer: PRIVATE HEALTH INSURANCE | Admitting: Family Medicine

## 2017-02-07 ENCOUNTER — Encounter: Payer: Self-pay | Admitting: Nurse Practitioner

## 2017-02-07 NOTE — Telephone Encounter (Signed)
Pt came into office on 02-06-2017 and saw provider.

## 2017-02-07 NOTE — Telephone Encounter (Signed)
Patient not seen at [facility] since February 2018.  She would need to be seen for any excuse note.  Her phone is not in working order.

## 2017-02-10 ENCOUNTER — Encounter: Payer: Self-pay | Admitting: Pediatrics

## 2017-02-10 ENCOUNTER — Encounter: Payer: Self-pay | Admitting: Nurse Practitioner

## 2017-02-12 ENCOUNTER — Ambulatory Visit (INDEPENDENT_AMBULATORY_CARE_PROVIDER_SITE_OTHER): Payer: PRIVATE HEALTH INSURANCE | Admitting: Pediatrics

## 2017-02-12 ENCOUNTER — Encounter: Payer: Self-pay | Admitting: Pediatrics

## 2017-02-12 ENCOUNTER — Encounter: Payer: Self-pay | Admitting: *Deleted

## 2017-02-12 VITALS — BP 108/73 | HR 106 | Temp 99.4°F | Ht 65.0 in | Wt 202.8 lb

## 2017-02-12 DIAGNOSIS — Z6833 Body mass index (BMI) 33.0-33.9, adult: Secondary | ICD-10-CM | POA: Diagnosis not present

## 2017-02-12 DIAGNOSIS — M79604 Pain in right leg: Secondary | ICD-10-CM

## 2017-02-12 DIAGNOSIS — M79605 Pain in left leg: Secondary | ICD-10-CM

## 2017-02-12 DIAGNOSIS — M545 Low back pain: Secondary | ICD-10-CM | POA: Diagnosis not present

## 2017-02-12 DIAGNOSIS — W57XXXA Bitten or stung by nonvenomous insect and other nonvenomous arthropods, initial encounter: Secondary | ICD-10-CM

## 2017-02-12 MED ORDER — TRIAMCINOLONE ACETONIDE 0.025 % EX OINT: 1.0000 "application " | TOPICAL_OINTMENT | Freq: Two times a day (BID) | CUTANEOUS | 0 refills | Status: DC

## 2017-02-12 NOTE — Progress Notes (Signed)
  Subjective:   Patient ID: Veronica Hendrix, female    DOB: 1986-07-24, 31 y.o.   MRN: [id number] CC: Back Pain (Lower, Blood patch done last week) and Tailbone Pain  HPI: Veronica Hendrix is a 31 y.o. female presenting for Back Pain (Lower, Blood patch done last week) and Tailbone Pain  LP almost two weeks ago Head felt better as long as she stayed laying down Then head started hurting more and more up and moing around Got progressively worse, not as bad as first LP Last Tuesday had blood patch done Has pain in her back and tail bone since blood patch  The next day noticed the pain in her legs/buttocks  Has been taking flexeril TID, not helpin gnow, helped with last LP Pain with bending over No weakness in legs, no incontinence  Had a tick bite two days ago Has been very itchy since then, attached for less than a day tried anti-itch creams, not helping  Elevated BMI: interested in losing weight Trying to cut back on junk food  Here today with her aunt and young nephew  Relevant past medical, surgical, family and social history reviewed. Allergies and medications reviewed and updated. History  Smoking Status  . Never Smoker  Smokeless Tobacco  . Never Used   ROS: Per HPI   Objective:    BP 108/73   Pulse (!) 106   Temp 99.4 F (37.4 C) (Oral)   Ht  (1.651 m)   Wt 202 lb 12.8 oz (92 kg)   BMI 33.75 kg/m   Wt Readings from Last 3 Encounters:  02/12/17 202 lb 12.8 oz (92 kg)  01/29/17 207 lb 3.2 oz (94 kg)  12/26/16 210 lb (95.3 kg)   Gen: NAD, alert, cooperative with exam, NCAT EYES: EOMI, no conjunctival injection, or no icterus ENT:  TMs pearly gray b/l, OP without erythema LYMPH: no cervical LAD CV: NRRR, normal S1/S2 Resp: CTABL, no wheezes, normal WOB Ext: No edema, warm Neuro: Alert and oriented, strength equal b/l UE and LE, sensation intact b/l LE, 2+ b/l patellar reflexes MSK: normal muscle bulk Skin: L breast with apprx 1 cm redness with surrounding  excoriation, no fluctuance or induration  Assessment & Plan:  Veronica Hendrix was seen today for back pain and tailbone pain.  Diagnoses and all orders for this visit:  Insect bite, initial encounter Does not appear infected Use below, ice to avoid scratching -     triamcinolone (KENALOG) 0.025 % ointment; Apply 1 application topically 2 (two) times daily.  Acute bilateral low back pain, with sciatica presence unspecified Pain in both lower extremities Recent blood patch, no weakness in legs, sensation intact, reflexes intact, if not improving over the next few days let me know, return precautions discussed  BMI 33.0-33.9,adult Discussed lifestyle changes, decreasing sugar intake Weight down apprx 8 lbs over last few weeks  Follow up plan: 3 mo Rex Kras, MD [facility]

## 2017-02-17 ENCOUNTER — Encounter: Payer: Self-pay | Admitting: Neurology

## 2017-02-17 ENCOUNTER — Encounter: Payer: Self-pay | Admitting: Pediatrics

## 2017-02-18 ENCOUNTER — Encounter: Payer: Self-pay | Admitting: *Deleted

## 2017-02-24 ENCOUNTER — Ambulatory Visit (INDEPENDENT_AMBULATORY_CARE_PROVIDER_SITE_OTHER): Payer: PRIVATE HEALTH INSURANCE | Admitting: Neurology

## 2017-02-24 ENCOUNTER — Encounter: Payer: Self-pay | Admitting: Neurology

## 2017-02-24 VITALS — BP 119/82 | HR 103 | Resp 18 | Ht 65.0 in | Wt 204.5 lb

## 2017-02-24 DIAGNOSIS — G932 Benign intracranial hypertension: Secondary | ICD-10-CM | POA: Diagnosis not present

## 2017-02-24 DIAGNOSIS — R0683 Snoring: Secondary | ICD-10-CM | POA: Diagnosis not present

## 2017-02-24 DIAGNOSIS — M545 Low back pain, unspecified: Secondary | ICD-10-CM

## 2017-02-24 DIAGNOSIS — H4710 Unspecified papilledema: Secondary | ICD-10-CM

## 2017-02-24 DIAGNOSIS — G4719 Other hypersomnia: Secondary | ICD-10-CM

## 2017-02-24 DIAGNOSIS — F411 Generalized anxiety disorder: Secondary | ICD-10-CM | POA: Diagnosis not present

## 2017-02-24 DIAGNOSIS — R51 Headache: Secondary | ICD-10-CM | POA: Diagnosis not present

## 2017-02-24 DIAGNOSIS — R519 Headache, unspecified: Secondary | ICD-10-CM

## 2017-02-24 DIAGNOSIS — Z029 Encounter for administrative examinations, unspecified: Secondary | ICD-10-CM

## 2017-02-24 NOTE — Progress Notes (Signed)
[facility]  PATIENT: Veronica Hendrix DOB: September 02, 1986  REFERRING DOCTOR OR PCP:  Dr. Conley Rolls ([facility]; Fax [phone]); has appt to see new PCP [name] phone [phone]) SOURCE: patient, notes from Dr. Conley Rolls.    _________________________________   HISTORICAL  CHIEF COMPLAINT:  Chief Complaint  Patient presents with  . Pseudotumor Cerebri    Has had second LP.  Sts. she is still having 1-2 h/a's daily. Minimal relief with Tylenol/Ibuprofen.  Sts. she continues to have lower back pain since the LP.  Has been seen by pcp for same and sts. she was told "some blood probably clotted on a nerve."/fim    HISTORY OF PRESENT ILLNESS:  Veronica Hendrix is a 31 year old woman with headaches and papilledema.     She came in last month due to experiencing more HA and underwent another LP 01/30/17.   She feels the severity of the headaches improved but they still occur daily.   Vision is better now than last month.    IIH:  She is now able to tolerate Diamox 500 mg po bid better.    She has lost a few times.had trouble tolerating Diamox 500 mg twice a day and cut back to 500 mg daily.   IIH history:   She started to have headaches and mild visual changes in late 2016. These worsened during 2017 and she was found to have papilledema. She underwent a lumbar puncture 09/18/16 and opening pressure was elevated at 34 cm consistent with idiopathic intracranial hypertension. Unfortunately, she had a positional headache afterwards and return for a blood patch 09/23/2016.  Repeat LP 01/30/17 showed OP = 22 cm.       Leg pain/hip pain:   She reports LBP since her first LP.   She felt the pain worsened after the last blood patch.   Pain was going down her legs and that pain is better but the LB is still hurting.      Weight:   She is 204 pounds today and was 218 at the 09/2016 initial visit.   Phentermine has helped to lose weight.    OSA?:   She also notes difficulties with sleepiness  she snores but nobody has commented if she has pauses in her breathing or gasping.   Her Epworth Sleepiness Scale score showed moderate sleepiness, 15/24 at the initial visit.   She weighs about 50 pounds more now than she did 4 years ago before her pregnancy.  Mood:   She sees [facility] (physical sciences[facility] in [place].   She is on Rexulti and Zoloft.   She feels the med's help but anxiety has been worse the last few months,   She notes mild verbal fluency and STM issues.      REVIEW OF SYSTEMS: Constitutional: No fevers, chills, sweats, or change in appetite Eyes: Nas above Ear, nose and throat: No hearing loss, ear pain, nasal congestion, sore throat Cardiovascular: No chest pain, palpitations Respiratory: No shortness of breath at rest or with exertion.   No wheezes.   She snores and has some OSA signs.   GastrointestinaI: No nausea, vomiting, diarrhea, abdominal pain, fecal incontinence Genitourinary: No dysuria, urinary retention or frequency.  No nocturia. Musculoskeletal: No neck pain, back pain Integumentary: No rash, pruritus, skin lesions Neurological: as above Psychiatric: No depression at this time.  No anxiety Endocrine: No palpitations, diaphoresis, change in appetite, change in weigh or increased thirst Hematologic/Lymphatic: No anemia, purpura, petechiae. Allergic/Immunologic: No itchy/runny eyes, nasal congestion, recent  allergic reactions, rashes  ALLERGIES: No Known Allergies  HOME MEDICATIONS:  Current Outpatient Prescriptions:  .  acetaminophen (TYLENOL) 500 MG tablet, Take 1,000 mg by mouth every 6 (six) hours as needed for moderate pain. , Disp: , Rfl:  .  acetaZOLAMIDE (DIAMOX) 250 MG tablet, Take 2 tablets (500 mg total) by mouth 2 (two) times daily., Disp: 120 tablet, Rfl: 6 .  aspirin 500 MG EC tablet, Take 500 mg by mouth every 6 (six) hours as needed for pain., Disp: , Rfl:  .  Brexpiprazole (REXULTI) 0.5 MG TABS, Take 1 tablet (0.5 mg total) by mouth daily.,  Disp: 90 tablet, Rfl: 0 .  cyclobenzaprine (FLEXERIL) 5 MG tablet, Take 1 tablet (5 mg total) by mouth every 8 (eight) hours as needed for muscle spasms., Disp: 90 tablet, Rfl: 3 .  hydrOXYzine (ATARAX/VISTARIL) 25 MG tablet, Take 1 tablet (25 mg total) by mouth daily as needed for anxiety., Disp: 90 tablet, Rfl: 0 .  ibuprofen (ADVIL,MOTRIN) 200 MG tablet, Take 800 mg by mouth every 6 (six) hours as needed., Disp: , Rfl:  .  norethindrone-ethinyl estradiol 1/35 (ORTHO-NOVUM, NORTREL,CYCLAFEM) tablet, Take 1 tablet by mouth daily., Disp: , Rfl:  .  phentermine 30 MG capsule, Take 1 capsule (30 mg total) by mouth every morning., Disp: 30 capsule, Rfl: 5 .  sertraline (ZOLOFT) 100 MG tablet, Take 1.5 tablets (150 mg total) by mouth daily., Disp: 135 tablet, Rfl: 0 .  tetrahydrozoline (V-R EYE DROPS) 0.05 % ophthalmic solution, Place 1 drop into both eyes 3 (three) times daily as needed (for dry eyes)., Disp: , Rfl:  .  triamcinolone (KENALOG) 0.025 % ointment, Apply 1 application topically 2 (two) times daily., Disp: 30 g, Rfl: 0  PAST MEDICAL HISTORY: Past Medical History:  Diagnosis Date  . Bipolar 1 disorder (HCC)   . Headache   . HPV in female   . Panic attacks   . Post traumatic stress disorder (PTSD)   . Vision abnormalities     PAST SURGICAL HISTORY: Past Surgical History:  Procedure Laterality Date  . BLOOD PATCH      FAMILY HISTORY: Family History  Problem Relation Age of Onset  . Diabetes Mother   . Mental illness Father   . Hypertension Father   . Heart disease Maternal Grandmother   . Heart disease Maternal Grandfather   . Heart disease Paternal Grandfather     SOCIAL HISTORY:  Social History   Social History  . Marital status: Legally Separated    Spouse name: N/A  . Number of children: N/A  . Years of education: N/A   Occupational History  . Not on file.   Social History Main Topics  . Smoking status: Never Smoker  . Smokeless tobacco: Never Used  .  Alcohol use No     Comment: 10-03-2016 occa.  . Drug use: No     Comment: 10-03-2016 per pt no   . Sexual activity: Yes    Birth control/ protection: Inserts     Comment: Mirena   Other Topics Concern  . Not on file   Social History Narrative  . No narrative on file     PHYSICAL EXAM  Vitals:   02/24/17 1348  BP: 119/82  Pulse: (!) 103  Resp: 18  Weight: 204 lb 8 oz (92.8 kg)  Height: 5\' 5"  (1.651 m)    Body mass index is 34.03 kg/m.   General: The patient is well-developed and well-nourished and in no acute distress/  Pharynx is Mallampati 4.    Eyes:  Funduscopic exam shows very mild papilledema.  Venous pulsations are present.  Neurologic Exam  Mental status: The patient is alert and oriented x 3 at the time of the examination. The patient has apparent normal recent and remote memory, with an apparently normal attention span and concentration ability.   Speech is normal.  Cranial nerves: Extraocular movements are full. Pupils are equal, round, and reactive to light and accomodation.    Facial symmetry is present. There is good facial sensation to soft touch bilaterally.Facial strength is normal.  Trapezius and sternocleidomastoid strength is normal. No dysarthria is noted.  The tongue is midline, and the patient has symmetric elevation of the soft palate. No obvious hearing deficits are noted.  Motor:  Muscle bulk is normal.   Tone is normal. Strength is  5 / 5 in all 4 extremities.   Sensory: Sensory testing is intact to touch in all 4 extremities.  Coordination: Cerebellar testing reveals good finger-nose-finger and heel-to-shin bilaterally.  Gait and station: Station is normal.   Gait is arthritic. Tandem gait is normal. Romberg is negative.   Reflexes: Deep tendon reflexes are symmetric and normal bilaterally.       DIAGNOSTIC DATA (LABS, IMAGING, TESTING) - I reviewed patient records, labs, notes, testing and imaging myself where  available.     ASSESSMENT AND PLAN  Idiopathic intracranial hypertension  Papilledema of both eyes  Acute bilateral low back pain without sciatica  Anxiety state  Excessive daytime sleepiness - Plan: Home sleep test  Snoring - Plan: Home sleep test  Nonintractable headache, unspecified chronicity pattern, unspecified headache type   1.   Continue Diamox 500 mg bid    2.   Continue to lose weight (has lost about 18-20 pounds)   Continue phentermine 15 mg daily \\3    she has snoring, excessive daytime sleepiness and a crowded oropharynx. We will check a HST (in past, she was told that the PSG copay was high) 4.   She should follow-up with ophthalmology in 2-3 months for repeat eye exam +/- visual field testing. 5.   Fill out FMLA and short-term disability paperwork. 6.   rtc 3 months, sooner if new or worsening problems   Sarahgrace Broman A. Epimenio Foot, MD, PhD 02/24/2017, 2:54 PM Certified in Neurology, Clinical Neurophysiology, Sleep Medicine, Pain Medicine and Neuroimaging  [facility] [address], [address] [place], [place] [address] [phone]
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
## 2017-03-06 ENCOUNTER — Encounter: Payer: Self-pay | Admitting: Pediatrics

## 2017-03-06 ENCOUNTER — Encounter (HOSPITAL_COMMUNITY): Payer: Self-pay | Admitting: Psychiatry

## 2017-03-06 ENCOUNTER — Ambulatory Visit (INDEPENDENT_AMBULATORY_CARE_PROVIDER_SITE_OTHER): Payer: PRIVATE HEALTH INSURANCE | Admitting: Psychiatry

## 2017-03-06 DIAGNOSIS — F4310 Post-traumatic stress disorder, unspecified: Secondary | ICD-10-CM | POA: Diagnosis not present

## 2017-03-06 DIAGNOSIS — F419 Anxiety disorder, unspecified: Secondary | ICD-10-CM | POA: Diagnosis not present

## 2017-03-06 DIAGNOSIS — F331 Major depressive disorder, recurrent, moderate: Secondary | ICD-10-CM

## 2017-03-06 DIAGNOSIS — Z79899 Other long term (current) drug therapy: Secondary | ICD-10-CM | POA: Diagnosis not present

## 2017-03-06 DIAGNOSIS — Z818 Family history of other mental and behavioral disorders: Secondary | ICD-10-CM | POA: Diagnosis not present

## 2017-03-06 DIAGNOSIS — I10 Essential (primary) hypertension: Secondary | ICD-10-CM

## 2017-03-06 NOTE — Patient Instructions (Addendum)
1 Continue sertraline 150 mg daily 2. Continue Rexulti 0.5 mg daily 3. Cotniue hydroxyzine 25 mg once to twice a day as needed for anxiety 4. Return to clinic in one month

## 2017-03-18 ENCOUNTER — Encounter: Payer: Self-pay | Admitting: Family Medicine

## 2017-03-18 ENCOUNTER — Ambulatory Visit (INDEPENDENT_AMBULATORY_CARE_PROVIDER_SITE_OTHER): Payer: PRIVATE HEALTH INSURANCE | Admitting: Family Medicine

## 2017-03-18 VITALS — BP 123/86 | HR 97 | Temp 99.0°F | Ht 65.0 in | Wt 209.2 lb

## 2017-03-18 DIAGNOSIS — H66001 Acute suppurative otitis media without spontaneous rupture of ear drum, right ear: Secondary | ICD-10-CM

## 2017-03-18 MED ORDER — AMOXICILLIN-POT CLAVULANATE 875-125 MG PO TABS: 1.0000 | ORAL_TABLET | Freq: Two times a day (BID) | ORAL | 0 refills | Status: DC

## 2017-03-18 NOTE — Progress Notes (Signed)
   HPI  Patient presents today here with acute illness.  Patient reports 3 days of nasal congestion, cough, sneezing, and frontal headache.  She's also had right ear pain throughout this time.  She denies any chest pain, dyspnea, or difficulty tolerating foods or fluids.  She has subjective fever one day ago she also has malaise.  PMH: Smoking status noted ROS: Per HPI  Objective: BP 123/86   Pulse 97   Temp 99 F (37.2 C) (Oral)   Ht 5\' 5"  (1.651 m)   Wt 209 lb 3.2 oz (94.9 kg)   BMI 34.81 kg/m  Gen: NAD, alert, cooperative with exam HEENT: NCAT, left TM with erythema but no loss of landmarks, right TM with erythema, bulging membranes, no facial tenderness to palpation CV: RRR, good S1/S2, no murmur Resp: CTABL, no wheezes, non-labored Ext: No edema, warm Neuro: Alert and oriented, No gross deficits  Assessment and plan:  # Acute suppurative otitis media on the right Treat with Augmentin, expanded coverage slightly with worsening cough and fever one day ago. This should cover early developing pneumonia that's the case, however limited exam is reassuring. Discussed taking probiotics as well. Return to clinic as needed.   Meds ordered this encounter  Medications  . amoxicillin-clavulanate (AUGMENTIN) 875-125 MG tablet    Sig: Take 1 tablet by mouth 2 (two) times daily.    Dispense:  20 tablet    Refill:  0    Murtis SinkSam Bradshaw, MD [facility] 03/18/2017, 9:45 AM

## 2017-03-18 NOTE — Patient Instructions (Signed)
Great to see you!  Be sure to take all antibiotics, Also take pro-bipotics twice daily while you are on augmentin.   Otitis Media, Adult Otitis media is redness, soreness, and puffiness (swelling) in the space just behind your eardrum (middle ear). It may be caused by allergies or infection. It often happens along with a cold. Follow these instructions at home:  Take your medicine as told. Finish it even if you start to feel better.  Only take over-the-counter or prescription medicines for pain, discomfort, or fever as told by your doctor.  Follow up with your doctor as told. Contact a doctor if:  You have otitis media only in one ear, or bleeding from your nose, or both.  You notice a lump on your neck.  You are not getting better in 3-5 days.  You feel worse instead of better. Get help right away if:  You have pain that is not helped with medicine.  You have puffiness, redness, or pain around your ear.  You get a stiff neck.  You cannot move part of your face (paralysis).  You notice that the bone behind your ear hurts when you touch it. This information is not intended to replace advice given to you by your health care provider. Make sure you discuss any questions you have with your health care provider. Document Released: 03/25/2008 Document Revised: 03/14/2016 Document Reviewed: 05/04/2013 Elsevier Interactive Patient Education  2017 [employer]
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
## 2017-04-03 ENCOUNTER — Ambulatory Visit (HOSPITAL_COMMUNITY): Payer: Self-pay | Admitting: Psychiatry

## 2017-05-01 ENCOUNTER — Ambulatory Visit: Payer: PRIVATE HEALTH INSURANCE | Admitting: Neurology

## 2017-05-13 ENCOUNTER — Telehealth (HOSPITAL_COMMUNITY): Payer: Self-pay | Admitting: *Deleted

## 2017-05-13 NOTE — Telephone Encounter (Signed)
No refill unless she has follow up appointment

## 2017-05-13 NOTE — Telephone Encounter (Signed)
Pt pharmacy is requesting refills for pt Sertraline HCL 100 mg 1.5 tabs daily. Pt medication was last filled on 02-06-2017 with 135 tabs 0 refill. Pt was last seen on 03-06-2017. Pt pharmacy number is [phone] 

## 2017-05-13 NOTE — Telephone Encounter (Signed)
Pt pharmacy is requesting refills for pt Sertraline HCL 100 mg 1.5 tabs daily. Pt medication was last filled on 02-06-2017 with 135 tabs 0 refill. Pt was last seen on 03-06-2017. Pt pharmacy number is [phone]

## 2017-05-13 NOTE — Telephone Encounter (Signed)
Dr Hisada pt

## 2017-05-14 NOTE — Telephone Encounter (Signed)
noted 

## 2017-05-20 NOTE — Telephone Encounter (Signed)
Pt pharmacy faxed another refill request to office and RMA filled out form stating pt needed to call office to sch f/u appt and faxed form to pt pharmacy.

## 2017-05-22 NOTE — Telephone Encounter (Signed)
Called pt again and spoke with pt to sch f/u appt. Per pt she will have to call office back to resch appt due to not having insurance anymore. Informed pt that due to her being out of her medications, it's up to her to just get billed for the visit and still see the doctor. Per pt okay she will just do it that way and just get billed. F/u appt was made and pt agreed with appt

## 2017-05-26 ENCOUNTER — Encounter (HOSPITAL_COMMUNITY): Payer: Self-pay | Admitting: Psychiatry

## 2017-05-26 ENCOUNTER — Ambulatory Visit (INDEPENDENT_AMBULATORY_CARE_PROVIDER_SITE_OTHER): Payer: Self-pay | Admitting: Psychiatry

## 2017-05-26 ENCOUNTER — Telehealth (HOSPITAL_COMMUNITY): Payer: Self-pay | Admitting: *Deleted

## 2017-05-26 DIAGNOSIS — Z818 Family history of other mental and behavioral disorders: Secondary | ICD-10-CM

## 2017-05-26 DIAGNOSIS — I10 Essential (primary) hypertension: Secondary | ICD-10-CM

## 2017-05-26 DIAGNOSIS — G4700 Insomnia, unspecified: Secondary | ICD-10-CM

## 2017-05-26 DIAGNOSIS — F411 Generalized anxiety disorder: Secondary | ICD-10-CM

## 2017-05-26 DIAGNOSIS — F331 Major depressive disorder, recurrent, moderate: Secondary | ICD-10-CM

## 2017-05-26 MED ORDER — BREXPIPRAZOLE 0.5 MG PO TABS: 0.5000 mg | ORAL_TABLET | Freq: Every day | ORAL | 1 refills | Status: DC

## 2017-05-26 MED ORDER — HYDROXYZINE HCL 25 MG PO TABS: 25.0000 mg | ORAL_TABLET | Freq: Every day | ORAL | 1 refills | Status: DC | PRN

## 2017-05-26 MED ORDER — SERTRALINE HCL 100 MG PO TABS: 150.0000 mg | ORAL_TABLET | Freq: Every day | ORAL | 1 refills | Status: DC

## 2017-05-26 NOTE — Patient Instructions (Addendum)
1. Continue sertraline 150 mg daily 2. Continue Rexulti 0.5 mg daily  3. Continue hydroxyzine 25 mg daily as needed for anxiety 4. Return to clinic in five weeks for 30 mins

## 2017-05-26 NOTE — Telephone Encounter (Signed)
Pt came into office for f//u and stated she needed help with getting her Rexulti due to not having insurance and medication costing her too much. Pt [facility] for her to receive medication for free. Both pt and provider completed form and RMA faxed form to [phone] and sent completed form to scan center for form to be put in pt chart.
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
## 2017-05-29 ENCOUNTER — Ambulatory Visit: Payer: PRIVATE HEALTH INSURANCE | Admitting: Neurology

## 2017-06-19 ENCOUNTER — Ambulatory Visit: Payer: PRIVATE HEALTH INSURANCE | Admitting: Pediatrics
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
## 2017-07-07 ENCOUNTER — Ambulatory Visit (HOSPITAL_COMMUNITY): Payer: Self-pay | Admitting: Psychiatry

## 2017-07-29 ENCOUNTER — Encounter (HOSPITAL_COMMUNITY): Payer: Self-pay | Admitting: Psychiatry

## 2017-08-07 ENCOUNTER — Encounter: Payer: Self-pay | Admitting: Pediatrics

## 2017-08-07 ENCOUNTER — Ambulatory Visit (INDEPENDENT_AMBULATORY_CARE_PROVIDER_SITE_OTHER): Payer: Medicaid Other | Admitting: Pediatrics

## 2017-08-07 VITALS — BP 118/80 | HR 89 | Temp 97.2°F | Ht 65.0 in | Wt 232.6 lb

## 2017-08-07 DIAGNOSIS — Z0000 Encounter for general adult medical examination without abnormal findings: Secondary | ICD-10-CM | POA: Diagnosis not present

## 2017-08-07 DIAGNOSIS — G932 Benign intracranial hypertension: Secondary | ICD-10-CM

## 2017-08-07 DIAGNOSIS — Z23 Encounter for immunization: Secondary | ICD-10-CM

## 2017-08-07 DIAGNOSIS — H3562 Retinal hemorrhage, left eye: Secondary | ICD-10-CM

## 2017-08-07 DIAGNOSIS — F331 Major depressive disorder, recurrent, moderate: Secondary | ICD-10-CM

## 2017-08-07 DIAGNOSIS — Z6838 Body mass index (BMI) 38.0-38.9, adult: Secondary | ICD-10-CM | POA: Diagnosis not present

## 2017-08-07 LAB — BAYER DCA HB A1C WAIVED: HB A1C: 5.3 % (ref <7.0)
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
## 2017-08-08 ENCOUNTER — Encounter: Payer: Self-pay | Admitting: Pediatrics

## 2017-08-08 LAB — CMP14+EGFR
ALK PHOS: 87 IU/L (ref 39–117)
ALT: 22 IU/L (ref 0–32)
AST: 16 IU/L (ref 0–40)
Albumin/Globulin Ratio: 2.3 — ABNORMAL HIGH (ref 1.2–2.2)
Albumin: 4.6 g/dL (ref 3.5–5.5)
BILIRUBIN TOTAL: 0.4 mg/dL (ref 0.0–1.2)
BUN/Creatinine Ratio: 16 (ref 9–23)
BUN: 12 mg/dL (ref 6–20)
CHLORIDE: 102 mmol/L (ref 96–106)
CO2: 24 mmol/L (ref 20–29)
CREATININE: 0.74 mg/dL (ref 0.57–1.00)
Calcium: 9.7 mg/dL (ref 8.7–10.2)
GFR calc Af Amer: 126 mL/min/{1.73_m2} (ref >59)
GFR calc non Af Amer: 109 mL/min/{1.73_m2} (ref >59)
GLOBULIN, TOTAL: 2 g/dL (ref 1.5–4.5)
GLUCOSE: 100 mg/dL — AB (ref 65–99)
Potassium: 4.4 mmol/L (ref 3.5–5.2)
SODIUM: 142 mmol/L (ref 134–144)
Total Protein: 6.6 g/dL (ref 6.0–8.5)

## 2017-08-08 LAB — TSH: TSH: 1.98 u[IU]/mL (ref 0.450–4.500)

## 2017-09-28 ENCOUNTER — Other Ambulatory Visit: Payer: Self-pay | Admitting: Neurology

## 2017-10-04 ENCOUNTER — Ambulatory Visit: Payer: Medicaid Other | Admitting: Family Medicine

## 2017-10-04 VITALS — BP 123/84 | HR 91 | Temp 97.2°F | Ht 65.0 in | Wt 233.4 lb

## 2017-10-04 DIAGNOSIS — H9201 Otalgia, right ear: Secondary | ICD-10-CM | POA: Diagnosis not present

## 2017-10-04 NOTE — Progress Notes (Signed)
   HPI  Patient presents today here with ear pain.  Patient explains she had severe ear pain 3 days ago which seems to be gradually getting better. She also has cough and congestion with frequent frontal headache.  However she has no persistent sinus pain or pressure. She denies any body aches.  She has felt ill and had some mild malaise.  She denies shortness of breath, chest pain, fever, chills, sweats. She is tolerating food and fluids like usual.    PMH: Smoking status noted ROS: Per HPI  Objective: BP 123/84 (BP Location: Left Arm, Patient Position: Sitting, Cuff Size: Normal)   Pulse 91   Temp (!) 97.2 F (36.2 C) (Oral)   Ht 5\' 5"  (1.651 m)   Wt 233 lb 6.4 oz (105.9 kg)   BMI 38.84 kg/m  Gen: NAD, alert, cooperative with exam HEENT: NCAT, left TM within normal limits, right TM with erythema and loss of view of most of the ossicles, the tip of the malleus is barely visible CV: RRR, good S1/S2, no murmur Resp: CTABL, no wheezes, non-labored Ext: No edema, warm Neuro: Alert and oriented, No gross deficits  Assessment and plan:  #Right ear pain otalgia Improving, patient feels that she will continue to improve, if she has worsening pain she will call back for a course of amoxicillin. At this time it appears that she will improve without antibiotic treatment   Murtis SinkSam Bradshaw, MD [facility] 10/04/2017, 10:17 AM

## 2017-11-07 ENCOUNTER — Encounter: Payer: Self-pay | Admitting: Pediatrics

## 2017-11-07 ENCOUNTER — Ambulatory Visit: Payer: Medicaid Other | Admitting: Pediatrics

## 2017-11-07 VITALS — BP 133/87 | HR 102 | Temp 98.6°F | Ht 65.0 in | Wt 234.6 lb

## 2017-11-07 DIAGNOSIS — G932 Benign intracranial hypertension: Secondary | ICD-10-CM

## 2017-11-07 DIAGNOSIS — F331 Major depressive disorder, recurrent, moderate: Secondary | ICD-10-CM

## 2017-11-07 MED ORDER — BREXPIPRAZOLE 0.5 MG PO TABS: 0.5000 mg | ORAL_TABLET | Freq: Every day | ORAL | 1 refills | Status: DC

## 2017-11-07 NOTE — Progress Notes (Addendum)
Subjective:   Patient ID: Veronica Hendrix, female    DOB: [birth year], 32 y.o.   MRN: [id number] CC: Follow-up (3 month); Headache; Depression; and Anxiety  HPI: Veronica Hendrix is a 32 y.o. female presenting for Follow-up (3 month); Headache; Depression; and Anxiety  Here today with her mom Patient tearful, says that she is really struggling.  Has a hard time sleeping at night.  Has a hard time getting up out of bed or off the sofa.  She gets her son ready for school takes him to school and then stays in bed or on the sofa for the rest of the day.  She lives alone with her son.  Her boyfriend travels for work is coming Freight forward[place] tonight.  Her mom and dad live nearby and help her with her son when she needs help or is feeling overwhelmed.  She says her mood has been dow.n but is her anxiety that is bothering her the most.  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  She stopped answering the phone.  She will make phone calls.  She ran out of her Rexaltiseveral months ago, but was not able to call anyone to let them know.  She is out of work, now on [employer], did have alternate insurance before.  She has thoughts about not wanting to be here but says she would never do anything to hurt herself because her son is no one else to care for him.  Her sister and her kids live with her mother.  She has a history of pseudotumor cerebri, was following with neurology Overdue for an eye exam Now having headaches off and on daily.  Sometimes she wakes up with it, it goes away within an hour or so, sometimes it comes back later in the day.  She is not found anything that seems to make the headaches better or worse.  She has tried Tylenol in the past with no improvement.  No fevers, no nausea or vomiting  Relevant past medical, surgical, family and social history reviewed. Allergies and medications reviewed and updated. Social History   Tobacco Use  Smoking Status Never Smoker  Smokeless Tobacco Never Used    ROS: Per HPI   Objective:    BP 133/87   Pulse (!) 102   Temp 98.6 F (37 C) (Oral)   Ht 5\' 5"  (1.651 m)   Wt 234 lb 9.6 oz (106.4 kg)   BMI 39.04 kg/m   Wt Readings from Last 3 Encounters:  11/07/17 234 lb 9.6 oz (106.4 kg)  10/04/17 233 lb 6.4 oz (105.9 kg)  08/07/17 232 lb 9.6 oz (105.5 kg)    Gen: Cheerful, alert, cooperative with exam, NCAT EYES: EOMI, no conjunctival injection, or no icterus ENT: OP without erythema LYMPH: no cervical LAD CV: tachycardic, NRRR, normal S1/S2, no murmur, distal pulses 2+ b/l Resp: CTABL, no wheezes, normal WOB Abd: +BS, soft, NTND. no guarding or organomegaly Ext: No edema, warm Neuro: Alert and oriented, strength equal b/l UE and LE, coordination grossly normal MSK: normal muscle bulk  Assessment & Plan:  Sarrinah was seen today for follow-up, headache, depression and anxiety.  Diagnoses and all orders for this visit:  Pseudotumor cerebri Patient going from here to eye doctor for eye exam to reevaluate for papilledema. -     Ambulatory referral to Neurology  Moderate episode of recurrent major depressive disorder (HCC) Anxiety Worsening symptoms, overwhelmed with daily life. Sent in below.  Referral through virtual behavioral  health, patient of Dr. Vanetta Shawl.  Needs to be seen as soon as possible.  Patient is comfortable calling her mother if anything gets worse.  She feels like she is able to care of her son right now. -     Brexpiprazole (REXULTI) 0.5 MG TABS; Take 1 tablet (0.5 mg total) by mouth daily.  Mom's phone number which pt says is fine to leave appt information for is [phone]  Follow up plan: 2 weeks, sooner if needed Rex Kras, MD [place] [facility]

## 2017-11-10 ENCOUNTER — Telehealth (HOSPITAL_COMMUNITY): Payer: Self-pay

## 2017-11-10 NOTE — Telephone Encounter (Signed)
[facility]   Writer left a voice mail message.  

## 2017-11-11 ENCOUNTER — Telehealth: Payer: Self-pay

## 2017-11-11 ENCOUNTER — Encounter: Payer: Self-pay | Admitting: Pediatrics

## 2017-11-11 NOTE — Telephone Encounter (Signed)
[facility] - Left message 

## 2017-11-12 ENCOUNTER — Telehealth: Payer: Self-pay

## 2017-11-12 DIAGNOSIS — F331 Major depressive disorder, recurrent, moderate: Secondary | ICD-10-CM

## 2017-11-19 ENCOUNTER — Encounter: Payer: Self-pay | Admitting: Neurology

## 2017-11-19 ENCOUNTER — Ambulatory Visit (INDEPENDENT_AMBULATORY_CARE_PROVIDER_SITE_OTHER): Payer: Medicaid Other | Admitting: Neurology

## 2017-11-19 VITALS — BP 134/94 | HR 86 | Ht 65.0 in | Wt 240.0 lb

## 2017-11-19 DIAGNOSIS — R0683 Snoring: Secondary | ICD-10-CM

## 2017-11-19 DIAGNOSIS — H4710 Unspecified papilledema: Secondary | ICD-10-CM

## 2017-11-19 DIAGNOSIS — Z6834 Body mass index (BMI) 34.0-34.9, adult: Secondary | ICD-10-CM

## 2017-11-19 DIAGNOSIS — G4719 Other hypersomnia: Secondary | ICD-10-CM | POA: Diagnosis not present

## 2017-11-19 DIAGNOSIS — H539 Unspecified visual disturbance: Secondary | ICD-10-CM

## 2017-11-19 DIAGNOSIS — G4733 Obstructive sleep apnea (adult) (pediatric): Secondary | ICD-10-CM | POA: Diagnosis not present

## 2017-11-19 DIAGNOSIS — G932 Benign intracranial hypertension: Secondary | ICD-10-CM

## 2017-11-19 MED ORDER — PHENTERMINE HCL 15 MG PO CAPS: 15.0000 mg | ORAL_CAPSULE | ORAL | 5 refills | Status: DC

## 2017-11-19 MED ORDER — ACETAZOLAMIDE ER 500 MG PO CP12: ORAL_CAPSULE | ORAL | 11 refills | Status: DC

## 2017-11-19 NOTE — Progress Notes (Signed)
[facility]  PATIENT: Veronica Hendrix DOB: 1986/06/30  REFERRING DOCTOR OR PCP:  Dr. Conley Rolls (Optometry; Fax [phone]); has appt to see new PCP [facility] phone [phone]) SOURCE: patient, notes from Dr. Conley Rolls.    _________________________________   HISTORICAL  CHIEF COMPLAINT:  Chief Complaint  Patient presents with  . pseudotumor cerebri    Patient does not have any new symptoms to report, she states that everything is still about the same.     HISTORY OF PRESENT ILLNESS:  Veronica Hendrix is a 32 year old woman with headaches and papilledema.       Update 11/09/2017:   She gets daily headaches.   Pain occurs 30-60 minutes for several hours a day.    She has trouble functioning due to the headaches.   She stopped acetazolamide.  She felt it did not hel[ the HA's any.    She has had 2 LP's.   First one showed moderate elevation of OP (34 cm) and second one mild (22 cm).   HA's did a little better after the LP's.    Both times she also needed a blood patch.       She denies any visual blurring.    She needs to adjust for a minute after standing up due to a strange heavy sensation in her head.     She occ notes tunnel vision after standing.    She denies diplopia.    She went to ophthalmology (Dr. Conley Rolls in [place]) earlier this month and was noted to have bilateral papilledema and an enlarged blind spot bilaterally.    She has snoring and EDS.  Mom has witnessed OSA (snorts and pauses).    She never came for her PSG or for the HST.   She is willing to do this now.   Weight has increased from 2014 to 240 over the past year.        EPWORTH SLEEPINESS SCALE  On a scale of 0 - 3 what is the chance of dozing:  Sitting and Reading:   3 Watching TV:    3 Sitting inactive in a public place: 1 Passenger in car for one hour: 3 Lying down to rest in the afternoon: 3 Sitting and talking to someone: 0 Sitting quietly after lunch:  3 In a car, stopped in  traffic:  1  Total (out of 24):    17/24  She is seeing Behavioral Health in [place] and is currently on Rexulti and Zoloft.      From 02/24/2017: She came in last month due to experiencing more HA and underwent another LP 01/30/17.   She feels the severity of the headaches improved but they still occur daily.   Vision is better now than last month.    IIH:  She is now able to tolerate Diamox 500 mg po bid better.    She has lost a few times.had trouble tolerating Diamox 500 mg twice a day and cut back to 500 mg daily.   IIH history:   She started to have headaches and mild visual changes in late 2016. These worsened during 2017 and she was found to have papilledema. She underwent a lumbar puncture 09/18/16 and opening pressure was elevated at 34 cm consistent with idiopathic intracranial hypertension. Unfortunately, she had a positional headache afterwards and return for a blood patch 09/23/2016.  Repeat LP 01/30/17 showed OP = 22 cm.       Leg pain/hip pain:   She  reports LBP since her first LP.   She felt the pain worsened after the last blood patch.   Pain was going down her legs and that pain is better but the LB is still hurting.      Weight:   She is 204 pounds today and was 218 at the 09/2016 initial visit.   Phentermine has helped to lose weight.    OSA?:   She also notes difficulties with sleepiness she snores but nobody has commented if she has pauses in her breathing or gasping.   Her Epworth Sleepiness Scale score showed moderate sleepiness, 15/24 at the initial visit.   She weighs about 50 pounds more now than she did 4 years ago before her pregnancy.  Mood:   She sees Research [facility] (physical sciences[facility] health in [place].   She is on Rexulti and Zoloft.   She feels the med's help but anxiety has been worse the last few months,   She notes mild verbal fluency and STM issues.      REVIEW OF SYSTEMS: Constitutional: No fevers, chills, sweats, or change in appetite Eyes: Nas above Ear, nose and throat:  No hearing loss, ear pain, nasal congestion, sore throat Cardiovascular: No chest pain, palpitations Respiratory: No shortness of breath at rest or with exertion.   No wheezes.   She snores and has some OSA signs.   GastrointestinaI: No nausea, vomiting, diarrhea, abdominal pain, fecal incontinence Genitourinary: No dysuria, urinary retention or frequency.  No nocturia. Musculoskeletal: No neck pain, back pain Integumentary: No rash, pruritus, skin lesions Neurological: as above Psychiatric: No depression at this time.  No anxiety Endocrine: No palpitations, diaphoresis, change in appetite, change in weigh or increased thirst Hematologic/Lymphatic: No anemia, purpura, petechiae. Allergic/Immunologic: No itchy/runny eyes, nasal congestion, recent allergic reactions, rashes  ALLERGIES: No Known Allergies  HOME MEDICATIONS:  Current Outpatient Medications:  .  acetaminophen (TYLENOL) 500 MG tablet, Take 1,000 mg by mouth every 6 (six) hours as needed for moderate pain. , Disp: , Rfl:  .  aspirin 500 MG EC tablet, Take 500 mg by mouth every 6 (six) hours as needed for pain., Disp: , Rfl:  .  Brexpiprazole (REXULTI) 0.5 MG TABS, Take 1 tablet (0.5 mg total) by mouth daily., Disp: 30 tablet, Rfl: 1 .  cyclobenzaprine (FLEXERIL) 5 MG tablet, Take 1 tablet (5 mg total) by mouth every 8 (eight) hours as needed for muscle spasms., Disp: 90 tablet, Rfl: 3 .  hydrOXYzine (ATARAX/VISTARIL) 25 MG tablet, Take 1 tablet (25 mg total) by mouth daily as needed for anxiety., Disp: 30 tablet, Rfl: 1 .  ibuprofen (ADVIL,MOTRIN) 200 MG tablet, Take 800 mg by mouth every 6 (six) hours as needed., Disp: , Rfl:  .  sertraline (ZOLOFT) 100 MG tablet, Take 1.5 tablets (150 mg total) by mouth daily., Disp: 45 tablet, Rfl: 1 .  acetaZOLAMIDE (DIAMOX SEQUELS) 500 MG capsule, Generic Diamox Sequels ER 500 mg twice a day, Disp: 60 capsule, Rfl: 11 .  phentermine 15 MG capsule, Take 1 capsule (15 mg total) by mouth  every morning., Disp: 30 capsule, Rfl: 5  PAST MEDICAL HISTORY: Past Medical History:  Diagnosis Date  . Bipolar 1 disorder (HCC)   . Headache   . HPV in female   . Panic attacks   . Post traumatic stress disorder (PTSD)   . Vision abnormalities     PAST SURGICAL HISTORY: Past Surgical History:  Procedure Laterality Date  . BLOOD PATCH      FAMILY HISTORY: Family  History  Problem Relation Age of Onset  . Diabetes Mother   . Mental illness Father   . Hypertension Father   . Heart disease Maternal Grandmother   . Heart disease Maternal Grandfather   . Heart disease Paternal Grandfather     SOCIAL HISTORY:  Social History   Socioeconomic History  . Marital status: Legally Separated    Spouse name: Not on file  . Number of children: Not on file  . Years of education: Not on file  . Highest education level: Not on file  Social Needs  . Financial resource strain: Not on file  . Food insecurity - worry: Not on file  . Food insecurity - inability: Not on file  . Transportation needs - medical: Not on file  . Transportation needs - non-medical: Not on file  Occupational History  . Not on file  Tobacco Use  . Smoking status: Never Smoker  . Smokeless tobacco: Never Used  Substance and Sexual Activity  . Alcohol use: No    Comment: 10-03-2016 occa.  . Drug use: No    Comment: 10-03-2016 per pt no   . Sexual activity: Yes    Birth control/protection: Inserts    Comment: Mirena  Other Topics Concern  . Not on file  Social History Narrative  . Not on file     PHYSICAL EXAM  Vitals:   11/19/17 1300  BP: (!) 134/94  Pulse: 86  Weight: 240 lb (108.9 kg)  Height: 5\' 5"  (1.651 m)    Body mass index is 39.94 kg/m.   General: The patient is well-developed and well-nourished and in no acute distress/  Pharynx is Mallampati 4.  Eyes:  Funduscopic exam shows bilateral papilledema.  Venous pulsations are not present.  Neurologic Exam  Mental status: The  patient is alert and oriented x 3 at the time of the examination. The patient has apparent normal recent and remote memory, with an apparently normal attention span and concentration ability.   Speech is normal.  Cranial nerves: Extraocular movements are full. Pupils are equal, round, and reactive to light and accomodation.    Facial symmetry is present. There is good facial sensation to soft touch bilaterally.Facial strength is normal.  Trapezius and sternocleidomastoid strength is normal. No dysarthria is noted.  The tongue is midline, and the patient has symmetric elevation of the soft palate. No obvious hearing deficits are noted.  Motor:  Muscle bulk is normal.   Tone is normal. Strength is  5 / 5 in all 4 extremities.   Sensory: Sensory testing is intact to touch in all 4 extremities.  Coordination: Cerebellar testing reveals good finger-nose-finger and heel-to-shin bilaterally.  Gait and station: Station is normal.   Gait is arthritic. Tandem gait is normal. Romberg is negative.   Reflexes: Deep tendon reflexes are symmetric and normal bilaterally.       DIAGNOSTIC DATA (LABS, IMAGING, TESTING) - I reviewed patient records, labs, notes, testing and imaging myself where available.     ASSESSMENT AND PLAN  Idiopathic intracranial hypertension  Papilledema of both eyes  OSA (obstructive sleep apnea) - Plan: Split night study  Snoring - Plan: Split night study  Excessive daytime sleepiness - Plan: Split night study  Visual disturbance  BMI 34.0-34.9,adult   1.   restart Diamox 500 mg bid.  If headaches do not improve, I will send her for another lumbar puncture.   She does not want to do this currently as she had spinal headaches after  the last 2 LPs 2.   We discussed the importance of weight loss and I have placed her on phentermine. This helped her lose weight in the past but she has regained the loss weight plus more 3   Split-night PSG for probable sleep apnea. She  has snoring, DDS and witnessed pauses in her breathing at night 4.   She should follow-up with ophthalmology in 2-3 months for repeat eye exam +/- visual field testing.  5.   6.   rtc 3 months, sooner if new or worsening problems   Richard A. Epimenio FootSater, MD, PhD 11/19/2017, 4:57 PM Certified in Neurology, Clinical Neurophysiology, Sleep Medicine, Pain Medicine and Neuroimaging  [facility] [address], [address] [place], [place] [address] [phone]

## 2017-11-21 ENCOUNTER — Ambulatory Visit (INDEPENDENT_AMBULATORY_CARE_PROVIDER_SITE_OTHER): Payer: Medicaid Other | Admitting: Pediatrics

## 2017-11-21 ENCOUNTER — Encounter: Payer: Self-pay | Admitting: Pediatrics

## 2017-11-21 VITALS — BP 120/76 | HR 92 | Temp 97.9°F | Ht 65.0 in | Wt 236.8 lb

## 2017-11-21 DIAGNOSIS — G932 Benign intracranial hypertension: Secondary | ICD-10-CM | POA: Diagnosis not present

## 2017-11-21 DIAGNOSIS — F331 Major depressive disorder, recurrent, moderate: Secondary | ICD-10-CM | POA: Diagnosis not present

## 2017-11-21 NOTE — Patient Instructions (Signed)
[facility] [phone]

## 2017-11-21 NOTE — Progress Notes (Signed)
  Subjective:   Patient ID: Veronica Hendrix, female    DOB: [birth year], 32 y.o.   MRN: [id number] CC: Follow-up (2 week) multiple med problems HPI: Veronica Hendrix is a 32 y.o. female presenting for Follow-up (2 week)  Feeling better since restarting rexulti 2 weeks ago Gets anxious a lot still, able to spend all day today at son's school which she hasnt been able to do for several weeks No thoughts of self harm  Here today with her son  Starting class for substitute teaching Planning to decrease sugar intake, wants to lose weight Taking phentermine Has been seen by neurology, restarted on acetazolamide Saw ophthalmology, does have some papilledema Continues to have some daily HA  Depression screen [facility] 2/9 11/21/2017 11/07/2017 08/07/2017 03/18/2017 02/12/2017  Decreased Interest 1 3 2 1 1   Down, Depressed, Hopeless 1 3 2 1 1   PHQ - 2 Score 2 6 4 2 2   Altered sleeping 3 3 2 1 2   Tired, decreased energy 2 3 2 1 1   Change in appetite 1 3 2 1 1   Feeling bad or failure about yourself  1 2 3 2 1   Trouble concentrating 1 2 2 1 1   Moving slowly or fidgety/restless 0 3 0 1 1  Suicidal thoughts 0 1 0 0 0  PHQ-9 Score 10 23 15 9 9   Difficult doing work/chores Somewhat difficult Somewhat difficult Somewhat difficult Somewhat difficult Somewhat difficult  Some recent data might be hidden     Relevant past medical, surgical, family and social history reviewed. Allergies and medications reviewed and updated. Social History   Tobacco Use  Smoking Status Never Smoker  Smokeless Tobacco Never Used   ROS: Per HPI   Objective:    BP 120/76   Pulse 92   Temp 97.9 F (36.6 C) (Oral)   Ht 5\' 5"  (1.651 m)   Wt 236 lb 12.8 oz (107.4 kg)   BMI 39.41 kg/m   Wt Readings from Last 3 Encounters:  11/21/17 236 lb 12.8 oz (107.4 kg)  11/19/17 240 lb (108.9 kg)  11/07/17 234 lb 9.6 oz (106.4 kg)    Gen: NAD, alert, cooperative with exam, NCAT EYES: EOMI, no conjunctival injection, or no  icterus ENT:  OP without erythema LYMPH: no cervical LAD CV: NRRR, normal S1/S2, no murmur, distal pulses 2+ b/l Resp: CTABL, no wheezes, normal WOB Ext: No edema, warm Neuro: Alert and oriented, strength equal b/l UE and LE, coordination grossly normal MSK: normal muscle bulk Psych: well groomed, normal affect, no thoughts of self harm  Assessment & Plan:  Veronica Hendrix was seen today for follow-up med problems.  Diagnoses and all orders for this visit:  Pseudotumor cerebri Restarted acetazolamide, seen by neuro  Major depressive disorder, recurrent episode, moderate (HCC) Much improved after restarting rexulti planning to call to get appt with psychiatry  Follow up plan: Return in about 3 months (around 02/18/2018). Rex Krasarol Griff Badley, MD [facility]

## 2017-11-25 ENCOUNTER — Telehealth (HOSPITAL_COMMUNITY): Payer: Self-pay
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
## 2017-11-25 NOTE — Telephone Encounter (Signed)
[facility] - left message.  

## 2017-11-27 ENCOUNTER — Encounter (HOSPITAL_COMMUNITY): Payer: Self-pay | Admitting: Psychiatry

## 2017-11-27 ENCOUNTER — Ambulatory Visit (HOSPITAL_COMMUNITY): Payer: Medicaid Other | Admitting: Psychiatry

## 2017-11-27 VITALS — BP 122/84 | HR 95 | Ht 65.0 in | Wt 227.0 lb

## 2017-11-27 DIAGNOSIS — F419 Anxiety disorder, unspecified: Secondary | ICD-10-CM | POA: Diagnosis not present

## 2017-11-27 DIAGNOSIS — R450 Nervousness: Secondary | ICD-10-CM

## 2017-11-27 DIAGNOSIS — F331 Major depressive disorder, recurrent, moderate: Secondary | ICD-10-CM

## 2017-11-27 MED ORDER — SERTRALINE HCL 100 MG PO TABS: 200.0000 mg | ORAL_TABLET | Freq: Every day | ORAL | 0 refills | Status: DC

## 2017-11-27 NOTE — Patient Instructions (Signed)
1. Increase sertraline 200 mg daily  2. Continue Rexulti 0.5 mg daily  3. Discontinue hydroxyzine 4. Return to clinic in one month for 30 mins 5. Referral to therapy

## 2017-12-12 ENCOUNTER — Ambulatory Visit (INDEPENDENT_AMBULATORY_CARE_PROVIDER_SITE_OTHER): Payer: Medicaid Other | Admitting: *Deleted

## 2017-12-12 ENCOUNTER — Ambulatory Visit: Payer: Medicaid Other

## 2017-12-12 DIAGNOSIS — Z111 Encounter for screening for respiratory tuberculosis: Secondary | ICD-10-CM

## 2017-12-12 DIAGNOSIS — Z23 Encounter for immunization: Secondary | ICD-10-CM

## 2017-12-12 NOTE — Progress Notes (Signed)
PPD placed L forearm Pt tolerated well 

## 2017-12-15 LAB — TB SKIN TEST
Induration: 0 mm
TB SKIN TEST: NEGATIVE

## 2017-12-17 IMAGING — XA DG FLUORO GUIDE NDL PLC/BX
2 series · 2 of 2 positions shown · non-contrast
Comparison: none

CLINICAL DATA: Idiopathic intracranial hypertension.

[Series 1: ortho standard · 1 of 1 slices shown (1 of 2)]
[im 1/1]
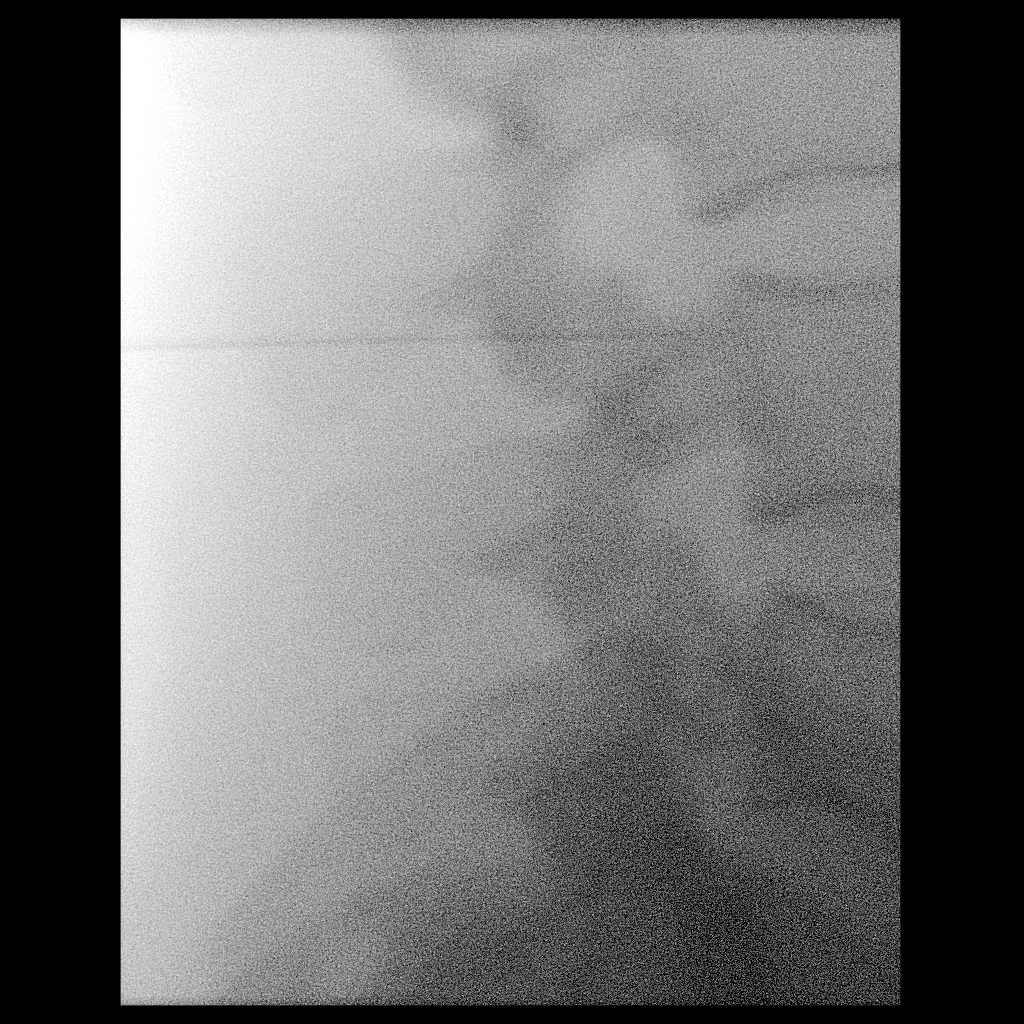

[Series 2: ortho standard · 1 of 1 slices shown (2 of 2)]
[im 1/1]
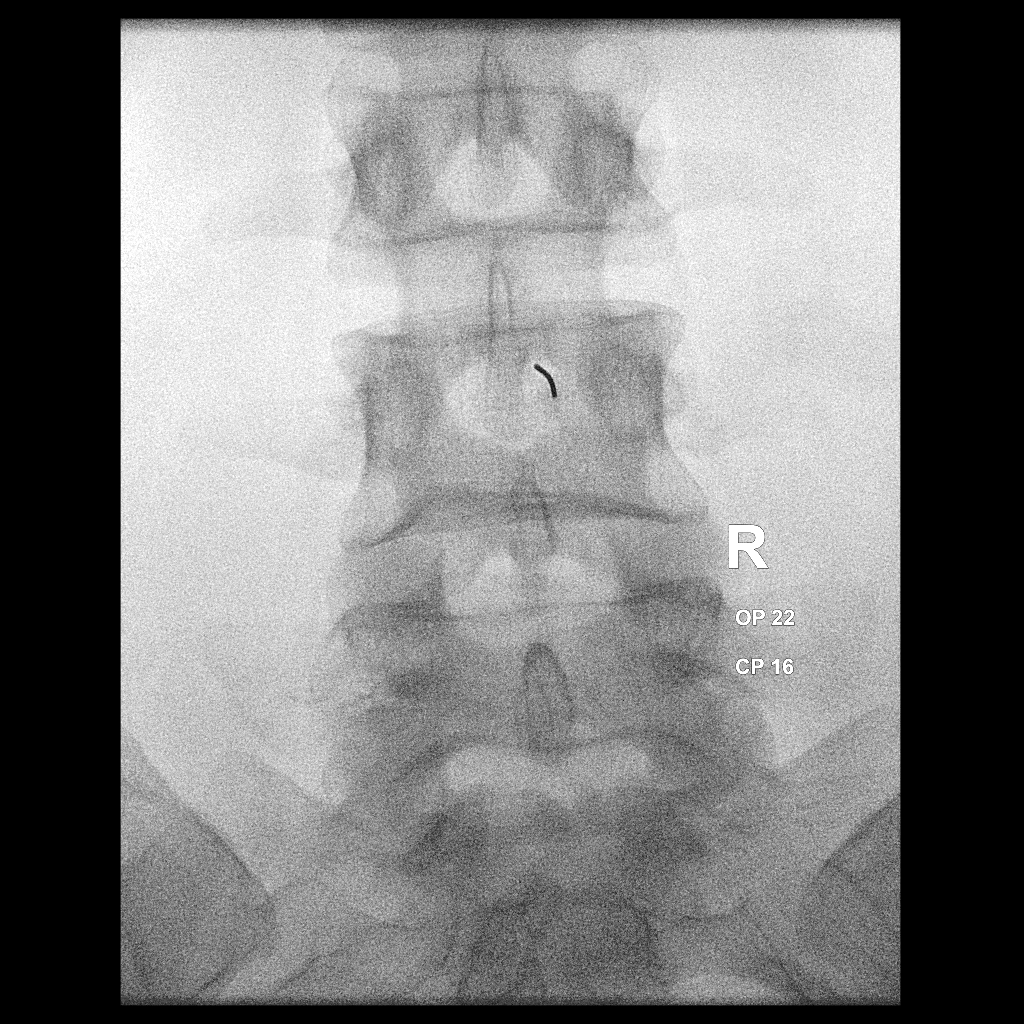

[2 of 2 positions shown; findings below may reference images not displayed]

EXAM:
DIAGNOSTIC LUMBAR PUNCTURE UNDER FLUOROSCOPIC GUIDANCE

FLUOROSCOPY TIME:  13 seconds corresponding to a Dose Area Product
of 21.88 ?Gy*m2

PROCEDURE:
Informed consent was obtained from the patient prior to the
procedure, including potential complications of headache, allergy,
and pain. With the patient prone, the lower back was prepped with
Betadine. 1% Lidocaine was used for local anesthesia. Lumbar
puncture was performed at the L3-L4 level using a 22 gauge needle
with return of clear CSF with an opening pressure of 22 cm water.
10.0 ml of CSF were obtained for laboratory studies. Closing
pressure was 16 cm water. The patient tolerated the procedure well
and there were no apparent complications. Patient was given
instructions for strict bed rest.
IMPRESSION: Technically successful diagnostic and therapeutic lumbar puncture.
See discussion above.

## 2017-12-18 ENCOUNTER — Ambulatory Visit: Payer: Medicaid Other | Admitting: Pediatrics

## 2017-12-18 ENCOUNTER — Encounter: Payer: Self-pay | Admitting: Pediatrics

## 2017-12-18 VITALS — BP 120/81 | HR 97 | Temp 98.2°F | Ht 65.0 in | Wt 225.6 lb

## 2017-12-18 DIAGNOSIS — F419 Anxiety disorder, unspecified: Secondary | ICD-10-CM

## 2017-12-18 DIAGNOSIS — Z0000 Encounter for general adult medical examination without abnormal findings: Secondary | ICD-10-CM | POA: Diagnosis not present

## 2017-12-18 DIAGNOSIS — E669 Obesity, unspecified: Secondary | ICD-10-CM

## 2017-12-18 DIAGNOSIS — Z6837 Body mass index (BMI) 37.0-37.9, adult: Secondary | ICD-10-CM

## 2017-12-18 DIAGNOSIS — G932 Benign intracranial hypertension: Secondary | ICD-10-CM

## 2017-12-18 NOTE — Progress Notes (Signed)
  Subjective:   Patient ID: Veronica Hendrix, female    DOB: [birth year], 32 y.o.   MRN: [id number] CC: annual exam HPI: [name] is a 32 y.o. female presenting for Annual Exam  Elevated BMI: on phentermine.  Hasnt had any sodas for past month. Getting more active, always doing things around the house.   Pseudotumor: on acetazolamide. Trying to lose weight.  Avoiding sugary foods, sodas.  Anxiety: has an appt in 2 weeks to see counselor.  Feeling very anxious all the time.  She thinks this is gotten worse and she is been on the phentermine.  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  Has paperwork to be filled out.  Relevant past medical, surgical, family and social history reviewed. Allergies and medications reviewed and updated. Social History   Tobacco Use  Smoking Status Never Smoker  Smokeless Tobacco Never Used   ROS: Per HPI   Objective:    BP 120/81   Pulse 97   Temp 98.2 F (36.8 C) (Oral)   Ht 5\' 5"  (1.651 m)   Wt 225 lb 9.6 oz (102.3 kg)   BMI 37.54 kg/m   Wt Readings from Last 3 Encounters:  12/18/17 225 lb 9.6 oz (102.3 kg)  11/21/17 236 lb 12.8 oz (107.4 kg)  11/19/17 240 lb (108.9 kg)    Gen: NAD, alert, cooperative with exam, NCAT EYES: EOMI, no conjunctival injection, or no icterus ENT:  TMs pearly gray b/l, OP without erythema LYMPH: no cervical LAD CV: NRRR, normal S1/S2, no murmur, distal pulses 2+ b/l Resp: CTABL, no wheezes, normal WOB Abd: +BS, soft, NTND. no guarding or organomegaly Ext: No edema, warm Neuro: Alert and oriented, strength equal b/l UE and LE, coordination grossly normal MSK: normal muscle bulk Psych: fidgety, mood is "ok", no thoughts of self harm  Assessment & Plan:  [name] was seen today for annual exam.  Diagnoses and all orders for this visit:  Encounter for preventive health examination  Pseudotumor cerebri Now on acetazolamide.  Headache somewhat improved.  Anxiety Ongoing symptoms.  Seems to be  worsened by the phentermine.  Patient is going to stop the phentermine for couple of weeks to see if that helps relieve some of his symptoms.  She is planning to start counseling soon.  Follows with psychiatry  Class 2 obesity with body mass index (BMI) of 37.0 to 37.9 in adult, unspecified obesity type, unspecified whether serious comorbidity present Continue lifestyle changes, decreasing sugar intake, walking regularly.  Follow up plan: 3 months, sooner if needed Rex Krasarol Dian Minahan, MD [facility]

## 2017-12-20 ENCOUNTER — Other Ambulatory Visit: Payer: Self-pay | Admitting: Neurology
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
## 2017-12-25 ENCOUNTER — Encounter (HOSPITAL_COMMUNITY): Payer: Self-pay | Admitting: Psychiatry

## 2017-12-25 ENCOUNTER — Ambulatory Visit (INDEPENDENT_AMBULATORY_CARE_PROVIDER_SITE_OTHER): Payer: Medicaid Other | Admitting: Psychiatry

## 2017-12-25 VITALS — BP 114/72 | HR 83 | Ht 65.0 in | Wt 222.0 lb

## 2017-12-25 DIAGNOSIS — F331 Major depressive disorder, recurrent, moderate: Secondary | ICD-10-CM | POA: Diagnosis not present

## 2017-12-25 DIAGNOSIS — G4700 Insomnia, unspecified: Secondary | ICD-10-CM | POA: Diagnosis not present

## 2017-12-25 DIAGNOSIS — F419 Anxiety disorder, unspecified: Secondary | ICD-10-CM | POA: Diagnosis not present

## 2017-12-25 DIAGNOSIS — Z818 Family history of other mental and behavioral disorders: Secondary | ICD-10-CM

## 2017-12-25 DIAGNOSIS — R450 Nervousness: Secondary | ICD-10-CM | POA: Diagnosis not present

## 2017-12-25 MED ORDER — SERTRALINE HCL 100 MG PO TABS: 200.0000 mg | ORAL_TABLET | Freq: Every day | ORAL | 0 refills | Status: DC

## 2017-12-25 MED ORDER — BREXPIPRAZOLE 0.5 MG PO TABS: 0.5000 mg | ORAL_TABLET | Freq: Every day | ORAL | 0 refills | Status: DC

## 2017-12-25 NOTE — Patient Instructions (Signed)
1. Continue sertraline 200 mg daily  2. Continue Rexulti 0.5 mg daily  3. Return to clinic in two months for 30 mins

## 2018-01-01 ENCOUNTER — Encounter (HOSPITAL_COMMUNITY): Payer: Self-pay | Admitting: Psychiatry

## 2018-01-01 ENCOUNTER — Ambulatory Visit (HOSPITAL_COMMUNITY): Payer: Medicaid Other | Admitting: Psychiatry

## 2018-01-01 DIAGNOSIS — F331 Major depressive disorder, recurrent, moderate: Secondary | ICD-10-CM | POA: Diagnosis not present
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
## 2018-01-13 ENCOUNTER — Encounter (HOSPITAL_COMMUNITY): Payer: Self-pay | Admitting: Psychiatry

## 2018-01-13 ENCOUNTER — Ambulatory Visit (HOSPITAL_COMMUNITY): Payer: Medicaid Other | Admitting: Psychiatry

## 2018-01-13 DIAGNOSIS — F331 Major depressive disorder, recurrent, moderate: Secondary | ICD-10-CM | POA: Diagnosis not present

## 2018-01-13 NOTE — Progress Notes (Signed)
   THERAPIST PROGRESS NOTE  Session Time: Tuesday 01/13/2018 10:12 AM - 11:08 AM  Participation Level: Active  Behavioral Response: Fairly GroomedAlertDepressed  Type of Therapy: Individual Therapy  Treatment Goals addressed: establish rapport, learn and implement behavioral strategies to cope with feelings of depression, learn and implement calming strategies to manage anxiety  Interventions: CBT and Supportive  Summary: Veronica Hendrix is a 32 y.o. female who is referred by psychiatrist Dr. Vanetta ShawlHisada to improve coping skills. She reports no psychiatric hospitalizations. Patient has been in and out of outpatient therapy for the past 6 years and was last seen by therapist in [place] 4-5 months. Patient states,  "Dr. Vanetta ShawlHisada recommended me because she thinks my daily life is causing me to be depressed and individual counseling might help me. My 76 yo son has ADHD and a lot of behavioral problems. He is on medication and he doesn't eat much or sleep well. He wakes up every 2-3 hours per night. My boyfriend is never home because he works out of town.  I am not working right now due to my head and back hurting. The depression causes me not to be able to get out of my house." Patient reports  depressed mood, excessive shopping when in a high mood, sexual impulsivity when in high mood, being always up and down and never in the middle. She says she doesn't trust self to make decisions. She also states not being able to get out of bed when she is really depressed.   Patient reports little to no change in symptoms since assessment session. She continues to experience depressed mood and irritability. She expresses frustration with self as she becomes stressed by her 31-year-old child's behavior. She reports additional stress related to her younger sister. 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 Patient reports constant worry about a variety of issues  Suicidal/Homicidal: Nowithout  intent/plan  Therapist Response: Established rapport, reviewed symptoms, administer PHQ-9, discussed stressors, facilitated expression of thoughts and feelings, developed treatment plan, provided psychoeducation regarding the body's response to stress, assisted patient identify the way her body experiences stress, discussed ways to trigger a relaxation response using deep breathing, practiced deep breathing, assigned patient to practice deep breathing 5-10 minutes 2 times per day  Plan: Return again in 1 weeks.  Diagnosis: Axis I: MDD, Recurrent, Moderate    Axis II: No diagnosis    Farhiya Rosten, LCSW 01/13/2018

## 2018-01-21 ENCOUNTER — Ambulatory Visit (INDEPENDENT_AMBULATORY_CARE_PROVIDER_SITE_OTHER): Payer: Medicaid Other | Admitting: Neurology

## 2018-01-21 DIAGNOSIS — G4733 Obstructive sleep apnea (adult) (pediatric): Secondary | ICD-10-CM | POA: Diagnosis not present

## 2018-01-21 DIAGNOSIS — R0683 Snoring: Secondary | ICD-10-CM

## 2018-01-21 DIAGNOSIS — G4719 Other hypersomnia: Secondary | ICD-10-CM

## 2018-01-21 DIAGNOSIS — G4700 Insomnia, unspecified: Secondary | ICD-10-CM

## 2018-01-22 NOTE — Progress Notes (Signed)
PATIENT'S NAME:  Veronica Hendrix, Veronica Hendrix DOB:      02/16/1986      MR#:    [id number]     DATE OF RECORDING: 01/21/2018 REFERRING Hendrix.D.:  Okey Regal L. Oswaldo Done, MD Study Performed:   Baseline Polysomnogram HISTORY:  Idiopathic intracranial hypertension, Bipolar, Headache, Panic attacks, PTSD.  The patient endorsed the Epworth Sleepiness Scale at 15/24 points.    The patient's weight 240 pounds with a height of 65 (inches), resulting in a BMI of 40. kg/m2.  The patient's neck circumference measured 16 inches.  CURRENT MEDICATIONS: Tylenol, Apirin, Rexult, Flexeril, Atarax, Advil, Zoloft, Diamox, Phentermine.   PROCEDURE:  This is a multichannel digital polysomnogram utilizing the Somnostar 11.2 system.  Electrodes and sensors were applied and monitored per AASM Specifications.   EEG, EOG, Chin and Limb EMG, were sampled at 200 Hz.  ECG, Snore and Nasal Pressure, Thermal Airflow, Respiratory Effort, CPAP Flow and Pressure, Oximetry was sampled at 50 Hz. Digital video and audio were recorded.      BASELINE STUDY  Lights Out was at 21:47 and Lights On at 05:00.  Total recording time (TRT) was 434 minutes, with a total sleep time (TST) of  400 minutes.   The patient's sleep latency was 20.5 minutes.  REM latency was 222.5 minutes.  The sleep efficiency was 92.2 %.     SLEEP ARCHITECTURE: WASO (Wake after sleep onset) was 13 minutes.  There were 7 minutes in Stage N1, 302.5 minutes Stage N2, 64 minutes Stage N3 and 26.5 minutes in Stage REM.  The percentage of Stage N1 was 1.8%, Stage N2 was 75.6%, Stage N3 was 16.% and Stage R (REM sleep) was 6.6%.   The arousals were noted as: 135 were spontaneous, 2 were associated with PLMs, 6 were associated with respiratory events.  Audio and video analysis did not show any abnormal or unusual movements, behaviors, phonations or vocalizations.   EKG showed  normal sinus rhythm (NSR).  RESPIRATORY ANALYSIS:  Snoring was noted.  There were a total of 9 respiratory  events:  0 obstructive apneas, 1 central apneas and 0 mixed apneas with a total of 1 apneas and an apnea index (AI) of 0.15 /hour. There were 8 hypopneas with a hypopnea index of 1.2 /hour. The patient also had 0 respiratory event related arousals (RERAs).      The total APNEA/HYPOPNEA INDEX (AHI) was 1.350./hour and the total RESPIRATORY DISTURBANCE INDEX was 1.35 /hour.  1 events occurred in REM sleep and 16 events in NREM. The REM AHI was 2.30. /hour, versus a non-REM AHI of 1.3. The patient spent 4 minutes of total sleep time in the supine position and 396 minutes in non-supine.. The supine AHI was 0.0 versus a non-supine AHI of 1.4.  OXYGEN SATURATION & C02:  The Wake baseline 02 saturation was 99%, with the lowest being 91%. Time spent below 89% saturation equaled 0 minutes.  PERIODIC LIMB MOVEMENTS:   The patient had a total of 5 Periodic Limb Movements.  The Periodic Limb Movement (PLM) index was .8 and the PLM Arousal index was .3/hour.   IMPRESSION:  1. There was no significant OSA or PLMS. 2. The amount of REM sleep was reduced, possibly due to medication effect.  RECOMMENDATIONS:  1. If the snoring is troublesome, consider an oral appliance. 2. Follow up with Dr. Epimenio Foot   I certify that I have reviewed the entire raw data recording prior to the issuance of this report in accordance with  the Standards of Accreditation of the American Academy of Sleep Medicine (AASM)    Jackline Castilla A. Epimenio FootSater, MD, PhD, FAAN Certified in Neurology, Clinical Neurophysiology, Sleep Medicine, Pain Medicine and Neuroimaging Director, [facility]  [facility] [address], [address] [place], [place] [address] [phone]    Demographics and Medical History           Name: Veronica Hendrix, Veronica Hendrix Age: 32 BMI: 40. Interp Physician: Despina Ariasichard Adara Kittle, MD  DOB: 1985-12-26 Ht-IN: 65 CM: 165 Referred By: Norwood Levoarol L. Oswaldo DoneVincent, MD  Pt. Tag:  Wt-LB: 240 KG:  109 Tested By: Linton Hamarlos Gonzalez-Armenta, RPSGT  Pt. #: [id number] Sex: Female Scored By: Linton Hamarlos Gonzalez-Armenta, RPSGT  Bed Tag: ROOM1 Race: Caucasian Occupation: ---  Indication for PS: ---   Sleep Summary    Sleep Time Statistics Minutes Hours    Time in Bed 434    7.2    Total Sleep Time 400    6.7    Total Sleep Time NREM 373.5    6.2    Total Sleep Time REM 26.5    0.4    Sleep Onset 20.5    0.3    Wake After Sleep Onset 13    0.2    Wake After Sleep Period 0.5    0.0    Latency Persistent Sleep 20.5    0.3    Sleep Efficiency 92.2 Percent    Lights out 21:47     Lights on 05:00    Sleep Disruption Events Count Index    Arousals 152 22.8    Awakenings 0 0    Arousals + Awakenings 152 22.8    REM Awakenings 0 0.0     Sleep Stage Statistics Wake N1 N2 N3 REM    Percent Stage to SPT 3.1  1.7  73.2  15.5  6.4  Percent   Sleep Period Time in Stage 13 7 302.5 64 26.5 Minutes   Latency to Stage  20.5 23 27  222.5 Minutes   Percent Stage to TST  1.8 75.6 16. 6.6 Percent   EKG Summary          EKG Statistics         Heart Rate, Wake 98 BPM  TST Epochs in HR Interval 0 < 29   Heart Rate, Steady Sleep Avg 89 BPM   0 30-59   PAC Events 0 Count   37 60-79   PVC Events 0 Count   733 80-99   Bradycardia 0 Count   30 100-119   Tachycardia 0 Count   0 120-139        0 140-159    NREM REM   0 > 160   Shortest R-R .6 .7       Longest R-R .9 .8        Respiration Summary  Event Statistics Total  With Arousal  With Awakening    Count Index  Count Index  Count Index   Apneas, Total 1 0.15  0 0.0   0 0.0    Hypopneas, Total 8 1.2  6 0.9   0 0.0    Apnea + Hypopnea Index 9 1.4   6 0.9   0 0.0    Apneas, Supine 0 0     Apneas, Non Supine 1 .2     Hypopneas, Supine 0 0     Hypopneas, Non Supine 8 1.2     % Sleep Apnea . Percent     %  Sleep Hypopnea .3 Percent    Oximetry Statistics       SpO2, Mean Wake 99 Percent     SpO2, Minimum 91 Percent     SpO2, Max 97  Percent     SpO2, Mean 95 Percent            Desaturation Index, REM 0.0  Index     Desaturation Index, NREM 1.3  Index     Desaturation Index, Total 1.2  Index             SpO2 Intervals > 89% 80-89% 70-79% 60-69% 50-59% 40-49% 30-39% < 30%  400 Percent Sleep Time 99.6 0 0 0 0 0 0 0  Body Position Statistics   Back Side L Side R Side Prone    Total Sleep Time   4 396.0 107.5 288.5 0 Minutes   Percent Time to TST   1.0  99.0  26.9  72.1  0.0  Percent   Number of Events   0 9.0 1 8 0 Count   Number of Apneas   0 1 0 1 0 Count   Number of Hypopneas   0 8 1 7  0 Count   Apnea Index   0.0  0.2  0.0  0.2  0.0  Index   Hypopnea Index   0.0  1.2  0.6  1.5  0.0  Index   Apnea + Hypopnea Index   0.0  1.4  0.6  1.7  0.0  Index  Respiration Events    Non REM, Pre Rx Statistics Non Supine  Supine    Central Mixed Obstr  Central Mixed Obstr   Apneas 0 0 0  0 0 0 Count  Apneas, Minimum SpO2 0 0 0  0 0 0 Percent     Hypopneas 0 0 8  0 0 0 Count  Hypopneas, Minimum SpO2 0 0 91  0 0 0 Percent     Apnea + Hypopneas Index 0.0 0.0 1.3  0.0 0.0 0.0 Index    REM, Pre Rx Statistics Non Supine  Supine    Central Mixed Obstr  Central Mixed Obstr   Apneas 1 0 0  0 0 0 Count  Apneas, Minimum SpO2 95 0 0  0 0 0 Percent     Hypopneas 0 0 0  0 0 0 Count  Hypopneas, Minimum SpO2 0 0 0  0 0 0 Percent     Apnea + Hypopnea Index 2.3 0.0 0.0  0.0 0.0 0.0 Index  Leg Movement Summary    PLM Non REM (Incl. Wake) REM Total    No Arousal Arousal Wake No Arousal Arousal Wake No Arousal Arousal Wake Total   Isolated 16 9 0 0 0 0 16 9 0 25    PLMS 3 2 0 0 0 0 3 2 0 5    Total 19 11 0 0 0 0 19 11 0 30   PLM Statistics PLMS Total     Count Index Count Index    PLM 5 .8 30 4.5     PLM with Arousal 2 .3 11 1.7     PLM, with Wake 0 0 0 0.0     PLM, Arousal + Wake 2 0.3 11 1.7     PLM, No Arousal 3 0.5  19 2.9     PLM, Non REM 5 0.8  30 4.8     PLM, REM 0 0.0  0 0.0      Technician Comments: PSG sleep study  scored at 4%. Pt was not split due to inadequate AHI. Insignificant OSA noted. No nocturia. EKG NSR.

## 2018-01-28 ENCOUNTER — Encounter (HOSPITAL_COMMUNITY): Payer: Self-pay | Admitting: Psychiatry

## 2018-01-28 ENCOUNTER — Ambulatory Visit (HOSPITAL_COMMUNITY): Payer: Medicaid Other | Admitting: Psychiatry

## 2018-01-28 DIAGNOSIS — F331 Major depressive disorder, recurrent, moderate: Secondary | ICD-10-CM

## 2018-01-28 NOTE — Progress Notes (Signed)
   THERAPIST PROGRESS NOTE  Session Time: Wednesday, 01/28/2018 2:10 PM - 3:00 PM  Participation Level: Active  Behavioral Response: Fairly GroomedAlertDepressed  Type of Therapy: Individual Therapy  Treatment Goals addressed: establish rapport, learn and implement behavioral strategies to cope with feelings of depression, learn and implement calming strategies to manage anxiety  Interventions: CBT and Supportive  Summary: Veronica Hendrix is a 32 y.o. female who is referred by psychiatrist Dr. Vanetta ShawlHisada to improve coping [phone] skills. She reports no psychiatric hospitalizations. Patient has been in and out of outpatient therapy for the  past 6 years and was last seen by therapist in [place] 4[place]5 months. Patient states,  "Dr. Vanetta ShawlHisada recommended me because she thinks my daily life is causing me to be depressed and individual counseling might help me. My 256 yo son has ADHD and a lot of behavioral problems. He is on medication and he doesn't eat much or sleep well. He wakes up every 2-3 hours per night. My boyfriend is never home because he works out of town.  I am not working right now due to my head and back hurting. The depression causes me not to be able to get out of my house." Patient reports  depressed mood, excessive shopping when in a high mood, sexual impulsivity when in high mood, being always up and down and never in the middle. She says she doesn't trust self to make decisions. She also states not being able to get out of bed when she is really depressed.   Patient last was seen 3 weeks ago. She reports increased stress, depressed mood, anxiety, and decreased involvement in activity since last session. She says she has been sleeping a lot and has stopped volunteering at [employer] school due to not feeling well. She reports she did go on field trip with son and his class today. She says this did help her feel better. She reports continued stress regarding parenting her son due to his behavioral  issues. She reports he can be combative and disruptive. She expresses guilt and frustration with self as she feels overwhelmed and tells him to go away. She fears this hurts his feelings. She reports receiving referral information/packet via email to complete to have [place] in [place]. However, she has not printed the information as she states feeling overwhelmed and then procrastinating.  She has been practicing deep breathing and says it has helped some especially when she experienced 2 recent panic attacks.  She reports she isn't practicing consistently. She reports additional stress related to worry about her mother who is hurt by patient's sister moving.     Suicidal/Homicidal: Nowithout intent/plan  Therapist Response:  reviewed symptoms, praised and reinforced patient's efforts to practice deep breathing, discussed effects, reviewed rationale for practicing regularly, oriented patient to CBT, discussed patient's values which include her family especially her relationship with her son, assisted patient identify value congruent behavior to increase behavioral activation, developed plan to improve self-care regarding exercise by walking 3-4 times a week beginning 5-10 minutes building up to 20 minutes, developed plan to complete referral packet (print form one day, complete one form of package per day until entire package is completed), explored ways to use support system to give self scheduled breaks from parenting responsibilities, assigned patient to implement strategies discussed in session,  Plan: Return again in 1 week  Diagnosis: Axis I: MDD, Recurrent, Moderate    Axis II: No diagnosis    Devory Mckinzie, LCSW 01/28/2018

## 2018-02-08 ENCOUNTER — Other Ambulatory Visit: Payer: Self-pay | Admitting: Neurology

## 2018-02-09 ENCOUNTER — Other Ambulatory Visit: Payer: Self-pay | Admitting: Neurology

## 2018-02-11 ENCOUNTER — Ambulatory Visit (HOSPITAL_COMMUNITY): Payer: Self-pay | Admitting: Psychiatry

## 2018-02-16 ENCOUNTER — Ambulatory Visit: Payer: Medicaid Other | Admitting: Pediatrics

## 2018-02-17 ENCOUNTER — Encounter: Payer: Self-pay | Admitting: Pediatrics
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
## 2018-02-24 ENCOUNTER — Ambulatory Visit (HOSPITAL_COMMUNITY): Payer: Self-pay | Admitting: Psychiatry

## 2018-03-03 ENCOUNTER — Telehealth (HOSPITAL_COMMUNITY): Payer: Self-pay | Admitting: *Deleted

## 2018-03-03 NOTE — Telephone Encounter (Signed)
P.A. APPROVED FOR REXULTI 0.5 MG EFFECTIVE: 03/02/18 ---- 02/25/2019 AUTH # [id number]

## 2018-03-10 ENCOUNTER — Ambulatory Visit (HOSPITAL_COMMUNITY): Payer: Medicaid Other | Admitting: Psychiatry

## 2018-03-10 ENCOUNTER — Encounter (HOSPITAL_COMMUNITY): Payer: Self-pay | Admitting: Psychiatry

## 2018-03-10 DIAGNOSIS — F331 Major depressive disorder, recurrent, moderate: Secondary | ICD-10-CM

## 2018-03-10 NOTE — Progress Notes (Signed)
   THERAPIST PROGRESS NOTE  Session Time: Tuesday 03/10/2018 1:09 PM -  2:00 PM    Participation Level: Active  Behavioral Response: Fairly GroomedAlert/Anxious  Type of Therapy: Individual Therapy  Treatment Goals addressed:  learn and implement behavioral strategies to cope with feelings of depression, learn and implement calming strategies to manage anxiety    Interventions: CBT and Supportive  Summary: Veronica Hendrix is a 32 y.o. female who is referred by psychiatrist Dr. Vanetta Shawl to improve coping [id number] skills. She reports no psychiatric hospitalizations. Patient has been in and out of outpatient therapy for the  past 6 years and was last seen by therapist in [place] 4-5 months. Patient states,  "Dr. Vanetta Shawl recommended me because she thinks my daily life is causing me to be depressed and individual counseling might help me. My 75 yo son has ADHD and a lot of behavioral problems. He is on medication and he doesn't eat much or sleep well. He wakes up every 2-3 hours per night. My boyfriend is never home because he works out of town.  I am not working right now due to my head and back hurting. The depression causes me not to be able to get out of my house." Patient reports  depressed mood, excessive shopping when in a high mood, sexual impulsivity when in high mood, being always up and down and never in the middle. She says she doesn't trust self to make decisions. She also states not being able to get out of bed when she is really depressed.   Patient last was seen 5-6  weeks ago. She reports less depressed mood but continued anxiety since last session. 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 However, she reports being anxious most of the time  and says she has panic attacks about once per day. She reports continuing to practice deep breathing but not always consistently. She reports completing packet to have son evaluated but now needing to mail  it. She report she has increased exercise by walking while at work.   \  Suicidal/Homicidal: Nowithout intent/plan  Therapist Response:  reviewed symptoms, administered PHQ-9 and GAD-7,  praised and reinforced patient's increased behavioral activation, discussed effects, praised and reinforced efforts to practice deep breathing,  reviewed rationale for practicing regularly, gathered more information from patient regarding her panic attacks, provided patient with handout to complete to assess for panic symptoms,  assigned patient to complete and bring to next session, assisted patient identify ways to maintain involvement in activities, assigned patient to continue practicing deep breathing   Plan: Return again in 1 week  Diagnosis: Axis I: MDD, Recurrent, Moderate    R/o panic disorder    Axis II: No diagnosis    Meghan Warshawsky, LCSW 03/10/2018

## 2018-03-12 DIAGNOSIS — Z0271 Encounter for disability determination: Secondary | ICD-10-CM

## 2018-03-24 ENCOUNTER — Ambulatory Visit (HOSPITAL_COMMUNITY): Payer: Self-pay | Admitting: Psychiatry

## 2018-03-26 ENCOUNTER — Ambulatory Visit (HOSPITAL_COMMUNITY): Payer: Medicaid Other | Admitting: Psychiatry

## 2018-03-26 ENCOUNTER — Other Ambulatory Visit (HOSPITAL_COMMUNITY): Payer: Self-pay | Admitting: Psychiatry

## 2018-03-26 MED ORDER — SERTRALINE HCL 100 MG PO TABS
200.0000 mg | ORAL_TABLET | Freq: Every day | ORAL | 0 refills | Status: DC
Start: 2018-03-26 — End: 2018-03-31
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
## 2018-03-31 ENCOUNTER — Ambulatory Visit (HOSPITAL_COMMUNITY): Payer: Medicaid Other | Admitting: Psychiatry

## 2018-03-31 ENCOUNTER — Encounter (HOSPITAL_COMMUNITY): Payer: Self-pay | Admitting: Psychiatry

## 2018-03-31 VITALS — BP 145/94 | HR 87 | Ht 65.0 in | Wt 230.0 lb

## 2018-03-31 DIAGNOSIS — F331 Major depressive disorder, recurrent, moderate: Secondary | ICD-10-CM | POA: Diagnosis not present

## 2018-03-31 MED ORDER — SERTRALINE HCL 100 MG PO TABS: 200.0000 mg | ORAL_TABLET | Freq: Every day | ORAL | 0 refills | Status: DC

## 2018-03-31 MED ORDER — BREXPIPRAZOLE 1 MG PO TABS: 1.0000 mg | ORAL_TABLET | Freq: Every day | ORAL | 0 refills | Status: DC

## 2018-03-31 NOTE — Patient Instructions (Addendum)
1. Continue sertraline 200 mg daily  2. Increase Rexulti 1 mg daily  3. Return to clinic in two months for 30 mins

## 2018-04-09 ENCOUNTER — Ambulatory Visit (HOSPITAL_COMMUNITY): Payer: Medicaid Other | Admitting: Psychiatry

## 2018-04-09 NOTE — Telephone Encounter (Signed)
Error in charting.

## 2018-04-09 NOTE — Patient Outreach (Signed)
Error in charting.

## 2018-04-17 ENCOUNTER — Ambulatory Visit: Payer: Medicaid Other | Admitting: Pediatrics

## 2018-04-17 ENCOUNTER — Encounter: Payer: Self-pay | Admitting: Pediatrics

## 2018-04-17 VITALS — BP 120/82 | HR 89 | Temp 98.3°F | Ht 65.0 in | Wt 232.0 lb

## 2018-04-17 DIAGNOSIS — R232 Flushing: Secondary | ICD-10-CM | POA: Diagnosis not present

## 2018-04-17 DIAGNOSIS — E661 Drug-induced obesity: Secondary | ICD-10-CM | POA: Diagnosis not present

## 2018-04-17 DIAGNOSIS — Z6838 Body mass index (BMI) 38.0-38.9, adult: Secondary | ICD-10-CM | POA: Diagnosis not present

## 2018-04-17 NOTE — Progress Notes (Signed)
  Subjective:   Patient ID: Veronica Hendrix, female    DOB: [birth year], 32 y.o.   MRN: [id number] CC: Hot Flashes  HPI: Veronica Hendrix is a 32 y.o. female   Last few weeks has been having more sweating.  Gets hot than usual.  Last week with the weather being warmer symptoms have gotten worse.  She feels like she is drenched in sweat when hot at times.  Usually happens when she is already hot.  Does think it can come out of the blue.  Mostly happening during the day.  Had mirena, removed several months, regular. Usually regular periods since then, every 30 days.  Most recent.  Came 2 days early.  Not currently on OCP, stopped by neurology.  Using condoms for birth control.   Rexulti was recently increased when she saw psychiatry.  Mood has been good over the last few weeks.  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  Has been working improving diet and increasing physical activity.  Relevant past medical, surgical, family and social history reviewed. Allergies and medications reviewed and updated. Social History   Tobacco Use  Smoking Status Never Smoker  Smokeless Tobacco Never Used   ROS: Per HPI   Objective:    BP 120/82   Pulse 89   Temp 98.3 F (36.8 C) (Oral)   Ht 5\' 5"  (1.651 m)   Wt 232 lb (105.2 kg)   BMI 38.61 kg/m   Wt Readings from Last 3 Encounters:  04/17/18 232 lb (105.2 kg)  03/31/18 230 lb (104.3 kg)  12/25/17 222 lb (100.7 kg)   Gen: NAD, alert, cooperative with exam, NCAT EYES: EOMI, no conjunctival injection, or no icterus ENT:  TMs pearly gray b/l, OP without erythema LYMPH: no cervical LAD CV: NRRR, normal S1/S2, no murmur, distal pulses 2+ b/l Resp: CTABL, no wheezes, normal WOB Abd: +BS, soft, NTND. no guarding or organomegaly Ext: No edema, warm Neuro: Alert and oriented, strength equal b/l UE and LE, coordination grossly normal MSK: normal muscle bulk  Assessment & Plan:  [name] was seen today for hot  flashes.  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 -     Amb Ref to Medical Weight Management -     Amb ref to Medical Nutrition Therapy-MNT  Hot flashes Happening primarily during the day when she is already hot.  If ongoing over the next few weeks we will get blood work.  Continue efforts towards weight loss as above.  Follow up plan: Return in about 3 months (around 07/18/2018). Rex Krasarol Allin Frix, MD [facility]

## 2018-04-20 ENCOUNTER — Encounter (INDEPENDENT_AMBULATORY_CARE_PROVIDER_SITE_OTHER): Payer: Self-pay

## 2018-04-27 ENCOUNTER — Ambulatory Visit (HOSPITAL_COMMUNITY): Payer: Medicaid Other | Admitting: Psychiatry

## 2018-05-11 ENCOUNTER — Ambulatory Visit (HOSPITAL_COMMUNITY): Payer: Medicaid Other | Admitting: Psychiatry

## 2018-05-19 ENCOUNTER — Encounter (HOSPITAL_COMMUNITY): Payer: Self-pay | Admitting: Psychiatry

## 2018-05-19 NOTE — Progress Notes (Signed)
Outpatient Therapist Discharge Summary  Veronica Hendrix    [birth year]   Admission Date: 01/01/2018 Discharge Date:  05/19/2018 Reason for Discharge:  Excessive No Shows/ no contact in 60 days Diagnosis:  Axis I:  MDD, Recurrent Moderate   Comments:    Peggy E Bynum

## 2018-05-20 ENCOUNTER — Encounter: Payer: Self-pay | Admitting: Pediatrics

## 2018-05-21 ENCOUNTER — Encounter: Payer: Self-pay | Admitting: Pediatrics

## 2018-05-21 ENCOUNTER — Ambulatory Visit: Payer: Medicaid Other | Admitting: Pediatrics

## 2018-05-21 VITALS — BP 124/84 | HR 100 | Temp 98.0°F | Ht 65.0 in | Wt 233.2 lb

## 2018-05-21 DIAGNOSIS — Z6838 Body mass index (BMI) 38.0-38.9, adult: Secondary | ICD-10-CM | POA: Diagnosis not present

## 2018-05-21 DIAGNOSIS — N898 Other specified noninflammatory disorders of vagina: Secondary | ICD-10-CM | POA: Diagnosis not present

## 2018-05-21 LAB — MICROSCOPIC EXAMINATION: RENAL EPITHEL UA: NONE SEEN /HPF

## 2018-05-21 LAB — WET PREP FOR TRICH, YEAST, CLUE
Clue Cell Exam: NEGATIVE
TRICHOMONAS EXAM: NEGATIVE
Yeast Exam: NEGATIVE

## 2018-05-21 LAB — URINALYSIS, COMPLETE
BILIRUBIN UA: NEGATIVE
Glucose, UA: NEGATIVE
KETONES UA: NEGATIVE
Nitrite, UA: NEGATIVE
RBC UA: NEGATIVE
UUROB: 0.2 mg/dL (ref 0.2–1.0)
pH, UA: 5.5 (ref 5.0–7.5)

## 2018-05-21 NOTE — Progress Notes (Signed)
  Subjective:   Patient ID: Veronica Hendrix, female    DOB: 12-07-1985, 32 y.o.   MRN: [id number] CC: Groin Swelling and Vaginal Discharge  HPI: Veronica Hendrix is a 32 y.o. female   Past 2 weeks she has had swelling in her vaginal area.  Last night and yesterday was so severe she stayed home from work, kept ice packs over her perineum.  Some discharge, slightly more than usual.  Patient is not regularly sexually active, no new partners.   Relevant past medical, surgical, family and social history reviewed. Allergies and medications reviewed and updated. Social History   Tobacco Use  Smoking Status Never Smoker  Smokeless Tobacco Never Used   ROS: Per HPI   Objective:    BP 124/84   Pulse 100   Temp 98 F (36.7 C) (Oral)   Ht '5\' 5"'$  (1.651 m)   Wt 233 lb 3.2 oz (105.8 kg)   BMI 38.81 kg/m   Wt Readings from Last 3 Encounters:  05/21/18 233 lb 3.2 oz (105.8 kg)  04/17/18 232 lb (105.2 kg)  12/18/17 225 lb 9.6 oz (102.3 kg)   Gen: NAD, alert, cooperative with exam, NCAT EYES: EOMI, no conjunctival injection, or no icterus CV: NRRR, normal S1/S2, no murmur, distal pulses 2+ b/l Resp: CTABL, no wheezes, normal WOB Abd: +BS, soft, NTND. no guarding or organomegaly Ext: No edema, warm Neuro: Alert and oriented GU: normal external female genitalia, small amount white discharge present in vaginal vault  Assessment & Plan:  Veronica Hendrix was seen today for groin swelling and vaginal discharge.  Diagnoses and all orders for this visit:  Vaginal discharge Symptoms improved today. Wet prep neg for yeast, clue cells. Follow up below. Let me know if symptoms worsening. Avoid baths, products, fragrances and other irritating substances in area -     Urine Culture; Future -     Cancel: C. trachomatis/N. gonorrhoeae RNA -     Urinalysis, Complete -     WET PREP FOR TRICH, YEAST, CLUE -     GC/Chlamydia Probe Amp([facility]) -     BMP8+EGFR -     CBC with Differential/Platelet  Adult BMI 38.0-38.9  kg/sq m Cont to encourage lifestyle changes -     Hepatic Function Panel  Other orders -     Microscopic Examination   Follow up plan: Return in about 3 months (around 08/21/2018). Assunta Found, MD [place]

## 2018-05-22 LAB — CBC WITH DIFFERENTIAL/PLATELET
BASOS ABS: 0 10*3/uL (ref 0.0–0.2)
BASOS: 0 %
EOS (ABSOLUTE): 0.4 10*3/uL (ref 0.0–0.4)
Eos: 3 %
Hematocrit: 42.4 % (ref 34.0–46.6)
Hemoglobin: 14.2 g/dL (ref 11.1–15.9)
IMMATURE GRANS (ABS): 0 10*3/uL (ref 0.0–0.1)
IMMATURE GRANULOCYTES: 0 %
LYMPHS: 33 %
Lymphocytes Absolute: 3.8 10*3/uL — ABNORMAL HIGH (ref 0.7–3.1)
MCH: 30 pg (ref 26.6–33.0)
MCHC: 33.5 g/dL (ref 31.5–35.7)
MCV: 90 fL (ref 79–97)
MONOS ABS: 0.5 10*3/uL (ref 0.1–0.9)
Monocytes: 4 %
Neutrophils Absolute: 6.8 10*3/uL (ref 1.4–7.0)
Neutrophils: 60 %
PLATELETS: 311 10*3/uL (ref 150–450)
RBC: 4.73 x10E6/uL (ref 3.77–5.28)
RDW: 13.4 % (ref 12.3–15.4)
WBC: 11.5 10*3/uL — AB (ref 3.4–10.8)

## 2018-05-22 LAB — BMP8+EGFR
BUN/Creatinine Ratio: 14 (ref 9–23)
BUN: 13 mg/dL (ref 6–20)
CALCIUM: 9.4 mg/dL (ref 8.7–10.2)
CHLORIDE: 100 mmol/L (ref 96–106)
CO2: 25 mmol/L (ref 20–29)
Creatinine, Ser: 0.91 mg/dL (ref 0.57–1.00)
GFR calc Af Amer: 98 mL/min/{1.73_m2} (ref >59)
GFR calc non Af Amer: 85 mL/min/{1.73_m2} (ref >59)
GLUCOSE: 94 mg/dL (ref 65–99)
Potassium: 4.1 mmol/L (ref 3.5–5.2)
SODIUM: 140 mmol/L (ref 134–144)

## 2018-05-22 LAB — HEPATIC FUNCTION PANEL
ALT: 35 IU/L — ABNORMAL HIGH (ref 0–32)
AST: 27 IU/L (ref 0–40)
Albumin: 4.8 g/dL (ref 3.5–5.5)
Alkaline Phosphatase: 87 IU/L (ref 39–117)
BILIRUBIN TOTAL: 0.5 mg/dL (ref 0.0–1.2)
Bilirubin, Direct: 0.15 mg/dL (ref 0.00–0.40)
TOTAL PROTEIN: 7.2 g/dL (ref 6.0–8.5)

## 2018-05-22 LAB — GC/CHLAMYDIA PROBE AMP
CHLAMYDIA, DNA PROBE: NEGATIVE
NEISSERIA GONORRHOEAE BY PCR: NEGATIVE

## 2018-05-25 ENCOUNTER — Ambulatory Visit: Payer: Medicaid Other | Admitting: Pediatrics

## 2018-05-25 ENCOUNTER — Ambulatory Visit (HOSPITAL_COMMUNITY): Payer: Self-pay | Admitting: Psychiatry
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
## 2018-06-08 ENCOUNTER — Ambulatory Visit (HOSPITAL_COMMUNITY): Payer: Self-pay | Admitting: Psychiatry

## 2018-06-09 ENCOUNTER — Ambulatory Visit (HOSPITAL_COMMUNITY): Payer: Medicaid Other | Admitting: Psychiatry

## 2018-06-18 ENCOUNTER — Ambulatory Visit: Payer: Self-pay | Admitting: Nutrition

## 2018-06-25 ENCOUNTER — Encounter (INDEPENDENT_AMBULATORY_CARE_PROVIDER_SITE_OTHER): Payer: Self-pay

## 2018-07-20 ENCOUNTER — Ambulatory Visit: Payer: Medicaid Other | Admitting: Pediatrics

## 2018-07-29 ENCOUNTER — Ambulatory Visit: Payer: Self-pay | Admitting: Nutrition

## 2018-08-06 ENCOUNTER — Encounter: Payer: Self-pay | Admitting: Pediatrics

## 2019-05-31 DIAGNOSIS — O99891 Other specified diseases and conditions complicating pregnancy: Secondary | ICD-10-CM | POA: Insufficient documentation

## 2019-05-31 DIAGNOSIS — Z8659 Personal history of other mental and behavioral disorders: Secondary | ICD-10-CM | POA: Insufficient documentation

## 2019-08-04 ENCOUNTER — Ambulatory Visit: Payer: Medicaid Other | Admitting: Family Medicine

## 2019-12-15 DIAGNOSIS — O1490 Unspecified pre-eclampsia, unspecified trimester: Secondary | ICD-10-CM

## 2019-12-15 DIAGNOSIS — O24419 Gestational diabetes mellitus in pregnancy, unspecified control: Secondary | ICD-10-CM

## 2020-09-13 ENCOUNTER — Other Ambulatory Visit: Payer: Self-pay

## 2020-09-13 ENCOUNTER — Encounter: Payer: Self-pay | Admitting: Adult Health

## 2020-09-13 ENCOUNTER — Ambulatory Visit (INDEPENDENT_AMBULATORY_CARE_PROVIDER_SITE_OTHER): Payer: Medicaid Other | Admitting: Adult Health

## 2020-09-13 VITALS — BP 121/83 | HR 85 | Ht 65.0 in | Wt 228.6 lb

## 2020-09-13 DIAGNOSIS — O09299 Supervision of pregnancy with other poor reproductive or obstetric history, unspecified trimester: Secondary | ICD-10-CM | POA: Diagnosis not present

## 2020-09-13 DIAGNOSIS — O3680X0 Pregnancy with inconclusive fetal viability, not applicable or unspecified: Secondary | ICD-10-CM | POA: Diagnosis not present

## 2020-09-13 DIAGNOSIS — Z3201 Encounter for pregnancy test, result positive: Secondary | ICD-10-CM

## 2020-09-13 DIAGNOSIS — Z3A01 Less than 8 weeks gestation of pregnancy: Secondary | ICD-10-CM | POA: Insufficient documentation

## 2020-09-13 DIAGNOSIS — N926 Irregular menstruation, unspecified: Secondary | ICD-10-CM | POA: Insufficient documentation

## 2020-09-13 LAB — POCT URINE PREGNANCY: Preg Test, Ur: POSITIVE — AB

## 2020-09-13 NOTE — Progress Notes (Signed)
°  Subjective:     Patient ID: Veronica Hendrix, female   DOB: 01-24-1986, 34 y.o.   MRN: [id number]  HPI Veronica Hendrix is a 34 year old white female,married in for UPT, has missed a period and had +HPT in August, then bleeding and most recently +HPT about 3 weeks ago. 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    Review of Systems +missed period +HPT Reviewed past medical,surgical, social and family history. Reviewed medications and allergies.     Objective:   Physical Exam BP 121/83 (BP Location: Right Arm, Patient Position: Sitting, Cuff Size: Large)    Pulse 85    Ht 5\' 5"  (1.651 m)    Wt 228 lb 9.6 oz (103.7 kg)    LMP 07/30/2020 (Exact Date)    BMI 38.04 kg/m UPT +, about 6+3 weeks by LMP with EDD 05/06/21. Skin warm and dry. Neck: mid line trachea, normal thyroid, good ROM, no lymphadenopathy noted. Lungs: clear to ausculation bilaterally. Cardiovascular: regular rate and rhythm. Abdomen is soft and non tender AA is 1 Fall risk is low PHQ 9 score is 5,no SI declines need for meds   Upstream - 09/13/20 0933      Pregnancy Intention Screening   Does the patient want to become pregnant in the next year? N/A    Does the patient's partner want to become pregnant in the next year? N/A    Would the patient like to discuss contraceptive options today? No      Contraception Wrap Up   Current Method Pregnant/Seeking Pregnancy    End Method Pregnant/Seeking Pregnancy    Contraception Counseling Provided No             Assessment:     1. Missed period On PNV Check QHCG  2. Less than [redacted] weeks gestation of pregnancy Will check QHCG   3. History of pre-eclampsia in prior pregnancy, currently pregnant Will add ASA at 1 2 weeks   4. Encounter to determine fetal viability of pregnancy, single or unspecified fetus Return in 2 weeks for dating 09/15/20    Plan:    Request records from [facility] Review handout by [facility]

## 2020-09-14 LAB — BETA HCG QUANT (REF LAB): hCG Quant: 33669 m[IU]/mL

## 2020-09-26 ENCOUNTER — Other Ambulatory Visit: Payer: Self-pay | Admitting: Obstetrics & Gynecology

## 2020-09-26 DIAGNOSIS — O3680X0 Pregnancy with inconclusive fetal viability, not applicable or unspecified: Secondary | ICD-10-CM

## 2020-09-27 ENCOUNTER — Other Ambulatory Visit: Payer: Self-pay

## 2020-09-27 ENCOUNTER — Ambulatory Visit (INDEPENDENT_AMBULATORY_CARE_PROVIDER_SITE_OTHER): Payer: Medicaid Other

## 2020-09-27 DIAGNOSIS — O3680X0 Pregnancy with inconclusive fetal viability, not applicable or unspecified: Secondary | ICD-10-CM | POA: Diagnosis not present

## 2020-09-27 DIAGNOSIS — Z3A08 8 weeks gestation of pregnancy: Secondary | ICD-10-CM

## 2020-09-27 NOTE — Progress Notes (Signed)
[place] 8+3 wks,single IUP with ys,positive fht 176 bpm,crl 19.89 mm,normal ovaries

## 2020-10-21 NOTE — L&D Delivery Note (Signed)
Delivery Note Called to pt's room d/t fetal bradycardia in the 80-90's. Cx 9/100/+1, not reducible.  Dr. Vergie Living called to come to room. Pt now complete, pushed once and at 7:58 PM a viable female was delivered via Vaginal, Spontaneous (Presentation: Left Occiput Posterior with a brow presentation).  Cord milked towards umbilicus, double clamped and cut and transferred to warmer, NICU team present. APGAR: 3/6/8; weight 6# 13 oz.  Pitocin diluted in 1000cc LR infused rapidly. Placenta separated spontaneously and delivered via CCT and maternal pushing effort, intact with 3 vessels, sent to pathology for evaluation.  Cord pH: 7.12  Anesthesia: None Episiotomy:  none Lacerations:  1st degree perineal, hemostatic Suture Repair:  Est. Blood Loss (mL):  50  Mom to postpartum.  Baby to Couplet care / Skin to Skin.  Jacklyn Shell 04/16/2021, 8:33 PM

## 2020-10-26 ENCOUNTER — Encounter: Payer: Medicaid Other | Admitting: Advanced Practice Midwife

## 2020-10-26 ENCOUNTER — Ambulatory Visit: Payer: Medicaid Other | Admitting: *Deleted

## 2020-10-26 ENCOUNTER — Other Ambulatory Visit: Payer: Medicaid Other

## 2020-11-02 ENCOUNTER — Ambulatory Visit: Payer: Medicaid Other | Admitting: *Deleted

## 2020-11-02 ENCOUNTER — Ambulatory Visit (INDEPENDENT_AMBULATORY_CARE_PROVIDER_SITE_OTHER): Payer: Medicaid Other | Admitting: Advanced Practice Midwife

## 2020-11-02 ENCOUNTER — Encounter: Payer: Self-pay | Admitting: Advanced Practice Midwife

## 2020-11-02 ENCOUNTER — Other Ambulatory Visit: Payer: Self-pay

## 2020-11-02 VITALS — BP 131/85 | HR 99 | Wt 232.0 lb

## 2020-11-02 DIAGNOSIS — Z3480 Encounter for supervision of other normal pregnancy, unspecified trimester: Secondary | ICD-10-CM

## 2020-11-02 DIAGNOSIS — Z8632 Personal history of gestational diabetes: Secondary | ICD-10-CM

## 2020-11-02 DIAGNOSIS — O0990 Supervision of high risk pregnancy, unspecified, unspecified trimester: Secondary | ICD-10-CM | POA: Insufficient documentation

## 2020-11-02 DIAGNOSIS — F331 Major depressive disorder, recurrent, moderate: Secondary | ICD-10-CM

## 2020-11-02 DIAGNOSIS — Z3A13 13 weeks gestation of pregnancy: Secondary | ICD-10-CM

## 2020-11-02 DIAGNOSIS — O09299 Supervision of pregnancy with other poor reproductive or obstetric history, unspecified trimester: Secondary | ICD-10-CM | POA: Insufficient documentation

## 2020-11-02 DIAGNOSIS — Z3490 Encounter for supervision of normal pregnancy, unspecified, unspecified trimester: Secondary | ICD-10-CM | POA: Insufficient documentation

## 2020-11-02 DIAGNOSIS — Z1389 Encounter for screening for other disorder: Secondary | ICD-10-CM

## 2020-11-02 LAB — POCT URINALYSIS DIPSTICK OB
Blood, UA: NEGATIVE
Glucose, UA: NEGATIVE
Ketones, UA: NEGATIVE
Leukocytes, UA: NEGATIVE
Nitrite, UA: NEGATIVE
POC,PROTEIN,UA: NEGATIVE

## 2020-11-02 MED ORDER — ASPIRIN EC 81 MG PO TBEC: 162.0000 mg | DELAYED_RELEASE_TABLET | Freq: Every day | ORAL | 6 refills | Status: DC

## 2020-11-02 MED ORDER — SERTRALINE HCL 25 MG PO TABS
25.0000 mg | ORAL_TABLET | Freq: Every day | ORAL | 6 refills | Status: DC
Start: 2020-11-02 — End: 2021-03-05

## 2020-11-02 MED ORDER — BLOOD PRESSURE MONITOR MISC: 0 refills | Status: DC

## 2020-11-02 NOTE — Patient Instructions (Signed)
Veronica Hendrix, I greatly value your feedback.  If you receive a survey following your visit with [place] today, we appreciate you taking the time to fill it out.  Thanks, Cathie Beams, DNP, CNM  [facility] HAS MOVED!!! It is now [facility] at [facility] ([address] [place], [place] [address]) Entrance located off of [address] Free 24/7 valet parking   Nausea & Vomiting  Have saltine crackers or pretzels by your bed and eat a few bites before you raise your head out of bed in the morning  Eat small frequent meals throughout the day instead of large meals  Drink plenty of fluids throughout the day to stay hydrated, just don't drink a lot of fluids with your meals.  This can make your stomach fill up faster making you feel sick  Do not brush your teeth right after you eat  Products with real ginger are good for nausea, like ginger ale and ginger hard candy Make sure it says made with real ginger!  Sucking on sour candy like lemon heads is also good for nausea  If your prenatal vitamins make you nauseated, take them at night so you will sleep through the nausea  Sea Bands  If you feel like you need medicine for the nausea & vomiting please let us know  If you are unable to keep any fluids or food down please let us know   Constipation  Drink plenty of fluid, preferably water, throughout the day  Eat foods high in fiber such as fruits, vegetables, and grains  Exercise, such as walking, is a good way to keep your bowels regular  Drink warm fluids, especially warm prune juice, or decaf coffee  Eat a 1/2 cup of real oatmeal (not instant), 1/2 cup applesauce, and 1/2-1 cup warm prune juice every day  If needed, you may take Colace (docusate sodium) stool softener once or twice a day to help keep the stool soft.   If you still are having problems with constipation, you may take Miralax once daily as needed to help keep your bowels regular.   Home  Blood Pressure Monitoring for Patients   Your provider has recommended that you check your blood pressure (BP) at least once a week at home. If you do not have a blood pressure cuff at home, one will be provided for you. Contact your provider if you have not received your monitor within 1 week.   Helpful Tips for Accurate Home Blood Pressure Checks  . Don't smoke, exercise, or drink caffeine 30 minutes before checking your BP . Use the restroom before checking your BP (a full bladder can raise your pressure) . Relax in a comfortable upright chair . Feet on the ground . Left arm resting comfortably on a flat surface at the level of your heart . Legs uncrossed . Back supported . Sit quietly and don't talk . Place the cuff on your bare arm . Adjust snuggly, so that only two fingertips can fit between your skin and the top of the cuff . Check 2 readings separated by at least one minute . Keep a log of your BP readings . For a visual, please reference this diagram: http://ccnc.care/bpdiagram  Provider Name: [facility]     Phone: [phone]  Zone 1: ALL CLEAR  Continue to monitor your symptoms:  . BP reading is less than 140 (top number) or less than 90 (bottom number)  . No right upper stomach  pain . No headaches or seeing spots . No feeling nauseated or throwing up . No swelling in face and hands  Zone 2: CAUTION Call your doctor's office for any of the following:  . BP reading is greater than 140 (top number) or greater than 90 (bottom number)  . Stomach pain under your ribs in the middle or right side . Headaches or seeing spots . Feeling nauseated or throwing up . Swelling in face and hands  Zone 3: EMERGENCY  Seek immediate medical care if you have any of the following:  . BP reading is greater than160 (top number) or greater than 110 (bottom number) . Severe headaches not improving with Tylenol . Serious difficulty catching your breath . Any worsening symptoms  from Zone 2    First Trimester of Pregnancy The first trimester of pregnancy is from week 1 until the end of week 12 (months 1 through 3). A week after a sperm fertilizes an egg, the egg will implant on the wall of the uterus. This embryo will begin to develop into a baby. Genes from you and your partner are forming the baby. The female genes determine whether the baby is a boy or a girl. At 6-8 weeks, the eyes and face are formed, and the heartbeat can be seen on ultrasound. At the end of 12 weeks, all the baby's organs are formed.  Now that you are pregnant, you will want to do everything you can to have a healthy baby. Two of the most important things are to get good prenatal care and to follow your health care provider's instructions. Prenatal care is all the medical care you receive before the baby's birth. This care will help prevent, find, and treat any problems during the pregnancy and childbirth. BODY CHANGES Your body goes through many changes during pregnancy. The changes vary from woman to woman.   You may gain or lose a couple of pounds at first.  You may feel sick to your stomach (nauseous) and throw up (vomit). If the vomiting is uncontrollable, call your health care provider.  You may tire easily.  You may develop headaches that can be relieved by medicines approved by your health care provider.  You may urinate more often. Painful urination may mean you have a bladder infection.  You may develop heartburn as a result of your pregnancy.  You may develop constipation because certain hormones are causing the muscles that push waste through your intestines to slow down.  You may develop hemorrhoids or swollen, bulging veins (varicose veins).  Your breasts may begin to grow larger and become tender. Your nipples may stick out more, and the tissue that surrounds them (areola) may become darker.  Your gums may bleed and may be sensitive to brushing and flossing.  Dark spots or  blotches (chloasma, mask of pregnancy) may develop on your face. This will likely fade after the baby is born.  Your menstrual periods will stop.  You may have a loss of appetite.  You may develop cravings for certain kinds of food.  You may have changes in your emotions from day to day, such as being excited to be pregnant or being concerned that something may go wrong with the pregnancy and baby.  You may have more vivid and strange dreams.  You may have changes in your hair. These can include thickening of your hair, rapid growth, and changes in texture. Some women also have hair loss during or after pregnancy, or hair that  feels dry or thin. Your hair will most likely return to normal after your baby is born. WHAT TO EXPECT AT YOUR PRENATAL VISITS During a routine prenatal visit:  You will be weighed to make sure you and the baby are growing normally.  Your blood pressure will be taken.  Your abdomen will be measured to track your baby's growth.  The fetal heartbeat will be listened to starting around week 10 or 12 of your pregnancy.  Test results from any previous visits will be discussed. Your health care provider may ask you:  How you are feeling.  If you are feeling the baby move.  If you have had any abnormal symptoms, such as leaking fluid, bleeding, severe headaches, or abdominal cramping.  If you have any questions. Other tests that may be performed during your first trimester include:  Blood tests to find your blood type and to check for the presence of any previous infections. They will also be used to check for low iron levels (anemia) and Rh antibodies. Later in the pregnancy, blood tests for diabetes will be done along with other tests if problems develop.  Urine tests to check for infections, diabetes, or protein in the urine.  An ultrasound to confirm the proper growth and development of the baby.  An amniocentesis to check for possible genetic  problems.  Fetal screens for spina bifida and Down syndrome.  You may need other tests to make sure you and the baby are doing well. HOME CARE INSTRUCTIONS  Medicines  Follow your health care provider's instructions regarding medicine use. Specific medicines may be either safe or unsafe to take during pregnancy.  Take your prenatal vitamins as directed.  If you develop constipation, try taking a stool softener if your health care provider approves. Diet  Eat regular, well-balanced meals. Choose a variety of foods, such as meat or vegetable-based protein, fish, milk and low-fat dairy products, vegetables, fruits, and whole grain breads and cereals. Your health care provider will help you determine the amount of weight gain that is right for you.  Avoid raw meat and uncooked cheese. These carry germs that can cause birth defects in the baby.  Eating four or five small meals rather than three large meals a day may help relieve nausea and vomiting. If you start to feel nauseous, eating a few soda crackers can be helpful. Drinking liquids between meals instead of during meals also seems to help nausea and vomiting.  If you develop constipation, eat more high-fiber foods, such as fresh vegetables or fruit and whole grains. Drink enough fluids to keep your urine clear or pale yellow. Activity and Exercise  Exercise only as directed by your health care provider. Exercising will help you:  Control your weight.  Stay in shape.  Be prepared for labor and delivery.  Experiencing pain or cramping in the lower abdomen or low back is a good sign that you should stop exercising. Check with your health care provider before continuing normal exercises.  Try to avoid standing for long periods of time. Move your legs often if you must stand in one place for a long time.  Avoid heavy lifting.  Wear low-heeled shoes, and practice good posture.  You may continue to have sex unless your health care  provider directs you otherwise. Relief of Pain or Discomfort  Wear a good support bra for breast tenderness.    Take warm sitz baths to soothe any pain or discomfort caused by hemorrhoids. Use hemorrhoid cream  if your health care provider approves.    Rest with your legs elevated if you have leg cramps or low back pain.  If you develop varicose veins in your legs, wear support hose. Elevate your feet for 15 minutes, 3-4 times a day. Limit salt in your diet. Prenatal Care  Schedule your prenatal visits by the twelfth week of pregnancy. They are usually scheduled monthly at first, then more often in the last 2 months before delivery.  Write down your questions. Take them to your prenatal visits.  Keep all your prenatal visits as directed by your health care provider. Safety  Wear your seat belt at all times when driving.  Make a list of emergency phone numbers, including numbers for family, friends, the hospital, and police and fire departments. General Tips  Ask your health care provider for a referral to a local prenatal education class. Begin classes no later than at the beginning of month 6 of your pregnancy.  Ask for help if you have counseling or nutritional needs during pregnancy. Your health care provider can offer advice or refer you to specialists for help with various needs.  Do not use hot tubs, steam rooms, or saunas.  Do not douche or use tampons or scented sanitary pads.  Do not cross your legs for long periods of time.  Avoid cat litter boxes and soil used by cats. These carry germs that can cause birth defects in the baby and possibly loss of the fetus by miscarriage or stillbirth.  Avoid all smoking, herbs, alcohol, and medicines not prescribed by your health care provider. Chemicals in these affect the formation and growth of the baby.  Schedule a dentist appointment. At home, brush your teeth with a soft toothbrush and be gentle when you floss. SEEK MEDICAL  CARE IF:   You have dizziness.  You have mild pelvic cramps, pelvic pressure, or nagging pain in the abdominal area.  You have persistent nausea, vomiting, or diarrhea.  You have a bad smelling vaginal discharge.  You have pain with urination.  You notice increased swelling in your face, hands, legs, or ankles. SEEK IMMEDIATE MEDICAL CARE IF:   You have a fever.  You are leaking fluid from your vagina.  You have spotting or bleeding from your vagina.  You have severe abdominal cramping or pain.  You have rapid weight gain or loss.  You vomit blood or material that looks like coffee grounds.  You are exposed to [place] measles and have never had them.  You are exposed to fifth disease or chickenpox.  You develop a severe headache.  You have shortness of breath.  You have any kind of trauma, such as from a fall or a car accident. Document Released: 10/01/2001 Document Revised: 02/21/2014 Document Reviewed: 08/17/2013 [facility] Patient Information 2015 [place], [place]. This information is not intended to replace advice given to you by your health care provider. Make sure you discuss any questions you have with your health care provider.  Coronavirus (COVID-19) Are you at risk?  Are you at risk for the Coronavirus (COVID-19)?  To be considered HIGH RISK for Coronavirus (COVID-19), you have to meet the following criteria:  . Traveled to [place], [place], [place], [place] or [place];  and have fever, cough, and shortness of breath within the last 2 weeks of travel OR . Been in close contact with a person diagnosed with COVID-19 within the last 2 weeks and have fever, cough, and shortness of breath . IF YOU DO  NOT MEET THESE CRITERIA, YOU ARE CONSIDERED LOW RISK FOR COVID-19.  What to do if you are HIGH RISK for COVID-19?  Marland Kitchen If you are having a medical emergency, call 911. . Seek medical care right away. Before you go to a doctor's office, urgent care or emergency department,  call ahead and tell them about your recent travel, contact with someone diagnosed with COVID-19, and your symptoms. You should receive instructions from your physician's office regarding next steps of care.  . When you arrive at healthcare provider, tell the healthcare staff immediately you have returned from visiting [place], [place], [place], [place] or [place]; in the last two weeks or you have been in close contact with a person diagnosed with COVID-19 in the last 2 weeks.   . Tell the health care staff about your symptoms: fever, cough and shortness of breath. . After you have been seen by a medical provider, you will be either: o Tested for (COVID-19) and discharged home on quarantine except to seek medical care if symptoms worsen, and asked to  - Stay home and avoid contact with others until you get your results (4-5 days)  - Avoid travel on public transportation if possible (such as bus, train, or airplane) or o Sent to the Emergency Department by EMS for evaluation, COVID-19 testing, and possible admission depending on your condition and test results.  What to do if you are LOW RISK for COVID-19?  Reduce your risk of any infection by using the same precautions used for avoiding the common cold or flu:  Marland Kitchen Wash your hands often with soap and warm water for at least 20 seconds.  If soap and water are not readily available, use an alcohol-based hand sanitizer with at least 60% alcohol.  . If coughing or sneezing, cover your mouth and nose by coughing or sneezing into the elbow areas of your shirt or coat, into a tissue or into your sleeve (not your hands). . Avoid shaking hands with others and consider head nods or verbal greetings only. . Avoid touching your eyes, nose, or mouth with unwashed hands.  . Avoid close contact with people who are sick. . Avoid places or events with large numbers of people in one location, like concerts or sporting events. . Carefully consider travel plans you have or  are making. . If you are planning any travel outside or inside the [place], visit the CDC's Travelers' Health webpage for the latest health notices. . If you have some symptoms but not all symptoms, continue to monitor at home and seek medical attention if your symptoms worsen. . If you are having a medical emergency, call 911.   [place] / e-Visit: [email or web address]         [facility]: [facility]: [phone]                   [facility]: [phone]     Safe Medications in Pregnancy   Acne: Benzoyl Peroxide Salicylic Acid  Backache/Headache: Tylenol: 2 regular strength every 4 hours OR              2 Extra strength every 6 hours  Colds/Coughs/Allergies: Benadryl (alcohol free) 25 mg every 6 hours as needed Breath right strips Claritin Cepacol throat lozenges Chloraseptic throat spray Cold-Eeze- up to three times per day Cough drops, alcohol free Flonase (by prescription only) Guaifenesin Mucinex Robitussin DM (plain only, alcohol  free) Saline nasal spray/drops Sudafed (pseudoephedrine) & Actifed ** use only after [redacted] weeks gestation and if you do not have high blood pressure Tylenol Vicks Vaporub Zinc lozenges Zyrtec   Constipation: Colace Ducolax suppositories Fleet enema Glycerin suppositories Metamucil Milk of magnesia Miralax Senokot Smooth move tea  Diarrhea: Kaopectate Imodium A-D  *NO pepto Bismol  Hemorrhoids: Anusol Anusol HC Preparation H Tucks  Indigestion: Tums Maalox Mylanta Zantac  Pepcid  Insomnia: Benadryl (alcohol free) 25mg  every 6 hours as needed Tylenol PM Unisom, no Gelcaps  Leg Cramps: Tums MagGel  Nausea/Vomiting:  Bonine Dramamine Emetrol Ginger extract Sea bands Meclizine  Nausea medication to take during pregnancy:  Unisom (doxylamine succinate  25 mg tablets) Take one tablet daily at bedtime. If symptoms are not adequately controlled, the dose can be increased to a maximum recommended dose of two tablets daily (1/2 tablet in the morning, 1/2 tablet mid-afternoon and one at bedtime). Vitamin B6 100mg  tablets. Take one tablet twice a day (up to 200 mg per day).  Skin Rashes: Aveeno products Benadryl cream or 25mg  every 6 hours as needed Calamine Lotion 1% cortisone cream  Yeast infection: Gyne-lotrimin 7 Monistat 7   **If taking multiple medications, please check labels to avoid duplicating the same active ingredients **take medication as directed on the label ** Do not exceed 4000 mg of tylenol in 24 hours **Do not take medications that contain aspirin or ibuprofen   Meet the Provider [facility] is now offering FREE monthly 1-hour virtual Zoom sessions for new, current, and prospective patients.        During these sessions, you can:   Learn about our practice, model of care, services   Get answers to questions about pregnancy and birth during [place] your provider's brain about anything else!    Sessions will be hosted by [employer] for [employer], Engineer, materials, Physicians and Midwives          No registration required      2021 Dates:      All at 6pm     October 21st     November 18th   December 16th     January 20th  February 17th    To join one of these meetings, a few minutes before it is set to start:     Copy/paste the link into your web browser:  [email or web address]    OR  Scan the QR code below (open up your camera and point towards QR code; click on tab that pops up on your phone ("zoom")

## 2020-11-02 NOTE — Progress Notes (Signed)
INITIAL OBSTETRICAL VISIT Patient name: Veronica Hendrix MRN [id number]  Date of birth: 03-22-1986 Chief Complaint:   Initial Prenatal Visit (Nt/it)  History of Present Illness:   Veronica Hendrix is a 35 y.o. [id number]  female at [redacted]w[redacted]d by LMP c/w u/s at 8 weeks with an Estimated Date of Delivery: 05/06/21 being seen today for her initial obstetrical visit.   Her obstetrical history is significant for PreE w/SF; ? Ischemic brain injury w/? CP; Had symptomatic Covid 12/29, doing better now.     Today she reports anxious again  Wants to restart. Zoloft. .  Depression screen [facility] 2/9 11/02/2020 09/13/2020 05/21/2018 04/17/2018 11/21/2017  Decreased Interest 1 1 0 1 1  Down, Depressed, Hopeless 1 0 0 1 1  PHQ - 2 Score 2 1 0 2 2  Altered sleeping 1 1 - 0 3  Tired, decreased energy 2 1 - 1 2  Change in appetite 1 1 - 1 1  Feeling bad or failure about yourself  2 1 - 0 1  Trouble concentrating 1 0 - 1 1  Moving slowly or fidgety/restless 0 0 - 0 0  Suicidal thoughts 0 0 - 0 0  PHQ-9 Score 9 5 - 5 10  Difficult doing work/chores - - - Somewhat difficult Somewhat difficult  Some encounter information is confidential and restricted. Go to Review Flowsheets activity to see all data.  Some recent data might be hidden    Patient's last menstrual period was 07/30/2020 (exact date). Last pap8/20/2020. Results were: normal Review of Systems:   Pertinent items are noted in HPI Denies cramping/contractions, leakage of fluid, vaginal bleeding, abnormal vaginal discharge w/ itching/odor/irritation, headaches, visual changes, shortness of breath, chest pain, abdominal pain, severe nausea/vomiting, or problems with urination or bowel movements unless otherwise stated above.  Pertinent History Reviewed:  Reviewed past medical,surgical, social, obstetrical and family history.  Reviewed problem list, medications and allergies. OB History  Gravida Para Term Preterm AB Living  3 2 1 1  0 2  SAB IAB Ectopic Multiple Live  Births          2    # Outcome Date GA Lbr Len/2nd Weight Sex Delivery Anes PTL Lv  3 Current           2 Preterm 12/15/19 [redacted]w[redacted]d  5 lb 4 oz (2.381 kg) M Vag-Spont EPI N LIV     Complications: Pre-eclampsia, Gestational diabetes  1 Term 09/24/11 [redacted]w[redacted]d  7 lb 8 oz (3.402 kg) M Vag-Spont EPI N LIV   Physical Assessment:   Vitals:   11/02/20 1418  BP: 131/85  Pulse: 99  Weight: 232 lb (105.2 kg)  Body mass index is 38.61 kg/m.       Physical Examination:  General appearance - well appearing, and in no distress  Mental status - alert, oriented to person, place, and time  Psych:  She has a normal mood and affect  Skin - warm and dry, normal color, no suspicious lesions noted  Chest - effort normal  Heart - normal rate and regular rhythm  Abdomen - soft, nontender  Extremities:  No swelling or varicosities noted  Pelvic - VULVA: normal appearing vulva with no masses, tenderness or lesions  VAGINA: normal appearing vagina with normal color and discharge, no lesions  CERVIX: normal appearing cervix without discharge or lesions, no CMT  Thin prep pap is not done  HR HPV cotesting    Results for orders placed or performed in visit on 11/02/20 (  from the past 24 hour(s))  POC Urinalysis Dipstick OB   Collection Time: 11/02/20  2:37 PM  Result Value Ref Range   Color, UA     Clarity, UA     Glucose, UA Negative Negative   Bilirubin, UA     Ketones, UA neg    Spec Grav, UA     Blood, UA neg    pH, UA     POC,PROTEIN,UA Negative Negative, Trace, Small (1+), Moderate (2+), Large (3+), 4+   Urobilinogen, UA     Nitrite, UA neg    Leukocytes, UA Negative Negative   Appearance     Odor      Assessment & Plan:  1) Low-Risk Pregnancy [id number] at [redacted]w[redacted]d with an Estimated Date of Delivery: 05/06/21   2) Initial OB visit  3) Hx PreE w/SF:  Asa 162  4) Hx A1DM:  A1c today  Meds:  Meds ordered this encounter  Medications  . Blood Pressure Monitor MISC    Sig: For regular home bp  monitoring during pregnancy    Dispense:  1 each    Refill:  0    Z34.81    Initial labs obtained Continue prenatal vitamins Reviewed n/v relief measures and warning s/s to report Reviewed recommended weight gain based on pre-gravid BMI Encouraged well-balanced diet Genetic & carrier screening discussed: requests Panorama, NT/IT and Horizon 14 , declines AFP Ultrasound discussed; fetal survey: requested CCNC completed> form faxed if has or is planning to apply for medicaid The nature of [facility] [place] [facility] [employer] with multiple MDs and other Advanced Practice Providers was explained to patient; also emphasized that fellows, residents, and students are part of our team. Doesn't have a home bp cuff. Rx faxed.. Check bp weekly, let us know if >140/90.        Veronica Hendrix 2:43 PM

## 2020-11-03 LAB — CBC/D/PLT+RPR+RH+ABO+RUB AB...
Antibody Screen: NEGATIVE
Basophils Absolute: 0 10*3/uL (ref 0.0–0.2)
Basos: 0 %
EOS (ABSOLUTE): 0.2 10*3/uL (ref 0.0–0.4)
Eos: 2 %
HCV Ab: <0.1 s/co ratio (ref 0.0–0.9)
HIV Screen 4th Generation wRfx: NONREACTIVE
Hematocrit: 38 % (ref 34.0–46.6)
Hemoglobin: 13.2 g/dL (ref 11.1–15.9)
Hepatitis B Surface Ag: NEGATIVE
Immature Grans (Abs): 0 10*3/uL (ref 0.0–0.1)
Immature Granulocytes: 0 %
Lymphocytes Absolute: 2.7 10*3/uL (ref 0.7–3.1)
Lymphs: 22 %
MCH: 30.5 pg (ref 26.6–33.0)
MCHC: 34.7 g/dL (ref 31.5–35.7)
MCV: 88 fL (ref 79–97)
Monocytes Absolute: 0.5 10*3/uL (ref 0.1–0.9)
Monocytes: 4 %
Neutrophils Absolute: 8.6 10*3/uL — ABNORMAL HIGH (ref 1.4–7.0)
Neutrophils: 72 %
Platelets: 308 10*3/uL (ref 150–450)
RBC: 4.33 x10E6/uL (ref 3.77–5.28)
RDW: 12.9 % (ref 11.7–15.4)
RPR Ser Ql: NONREACTIVE
Rh Factor: POSITIVE
Rubella Antibodies, IGG: <0.9 index — ABNORMAL LOW (ref >0.99)
WBC: 12 10*3/uL — ABNORMAL HIGH (ref 3.4–10.8)

## 2020-11-03 LAB — COMPREHENSIVE METABOLIC PANEL
ALT: 17 IU/L (ref 0–32)
AST: 17 IU/L (ref 0–40)
Albumin/Globulin Ratio: 1.8 (ref 1.2–2.2)
Albumin: 4.2 g/dL (ref 3.8–4.8)
Alkaline Phosphatase: 87 IU/L (ref 44–121)
BUN/Creatinine Ratio: 17 (ref 9–23)
BUN: 9 mg/dL (ref 6–20)
Bilirubin Total: 0.3 mg/dL (ref 0.0–1.2)
CO2: 22 mmol/L (ref 20–29)
Calcium: 9.6 mg/dL (ref 8.7–10.2)
Chloride: 103 mmol/L (ref 96–106)
Creatinine, Ser: 0.53 mg/dL — ABNORMAL LOW (ref 0.57–1.00)
GFR calc Af Amer: 144 mL/min/{1.73_m2} (ref >59)
GFR calc non Af Amer: 125 mL/min/{1.73_m2} (ref >59)
Globulin, Total: 2.4 g/dL (ref 1.5–4.5)
Glucose: 85 mg/dL (ref 65–99)
Potassium: 4.4 mmol/L (ref 3.5–5.2)
Sodium: 139 mmol/L (ref 134–144)
Total Protein: 6.6 g/dL (ref 6.0–8.5)

## 2020-11-03 LAB — PROTEIN / CREATININE RATIO, URINE
Creatinine, Urine: 156.9 mg/dL
Protein, Ur: 11.2 mg/dL
Protein/Creat Ratio: 71 mg/g creat (ref 0–200)

## 2020-11-03 LAB — HCV INTERPRETATION

## 2020-11-03 LAB — HEMOGLOBIN A1C
Est. average glucose Bld gHb Est-mCnc: 103 mg/dL
Hgb A1c MFr Bld: 5.2 % (ref 4.8–5.6)

## 2020-11-04 LAB — PMP SCREEN PROFILE (10S), URINE
Amphetamine Scrn, Ur: NEGATIVE ng/mL
BARBITURATE SCREEN URINE: NEGATIVE ng/mL
BENZODIAZEPINE SCREEN, URINE: NEGATIVE ng/mL
CANNABINOIDS UR QL SCN: NEGATIVE ng/mL
Cocaine (Metab) Scrn, Ur: NEGATIVE ng/mL
Creatinine(Crt), U: 170.9 mg/dL (ref 20.0–300.0)
Methadone Screen, Urine: NEGATIVE ng/mL
OXYCODONE+OXYMORPHONE UR QL SCN: NEGATIVE ng/mL
Opiate Scrn, Ur: NEGATIVE ng/mL
Ph of Urine: 5.9 (ref 4.5–8.9)
Phencyclidine Qn, Ur: NEGATIVE ng/mL
Propoxyphene Scrn, Ur: NEGATIVE ng/mL

## 2020-11-07 ENCOUNTER — Encounter: Payer: Self-pay | Admitting: Women's Health

## 2020-11-07 DIAGNOSIS — O09899 Supervision of other high risk pregnancies, unspecified trimester: Secondary | ICD-10-CM | POA: Insufficient documentation

## 2020-11-07 DIAGNOSIS — O99891 Other specified diseases and conditions complicating pregnancy: Secondary | ICD-10-CM | POA: Insufficient documentation

## 2020-11-30 ENCOUNTER — Telehealth: Payer: Medicaid Other | Admitting: Advanced Practice Midwife

## 2020-12-04 ENCOUNTER — Other Ambulatory Visit: Payer: Self-pay

## 2020-12-04 ENCOUNTER — Other Ambulatory Visit: Payer: Medicaid Other

## 2020-12-04 DIAGNOSIS — Z3480 Encounter for supervision of other normal pregnancy, unspecified trimester: Secondary | ICD-10-CM

## 2020-12-04 NOTE — Progress Notes (Addendum)
   NURSE VISIT- Fetal Heart Rate Check  SUBJECTIVE:  Veronica Hendrix is a 35 y.o. G79P1102 female at [redacted]w[redacted]d, here for a fetal heart rate check due to not feeling any movements.    OBJECTIVE:  LMP 07/30/2020 (Exact Date)   Appears well, no apparent distress  FHR 155 via doppler   ASSESSMENT: [id number] at [redacted]w[redacted]d with present fetal heart rate  PLAN: Follow-up as scheduled  Jobe Marker  12/04/2020 11:44 AM   Chart reviewed for nurse visit. Agree with plan of care.  Cheral Marker, [place] 12/04/2020 12:55 PM

## 2020-12-04 NOTE — BH Specialist Note (Signed)
Integrated Behavioral Health via Telemedicine Visit  12/04/2020 Veronica Hendrix [id number]  Number of Integrated Behavioral Health visits: 1 Session Start time: 10:15  Session End time: 11:07 Total time: 77  Referring Provider: Jacklyn Shell, CNM Patient/Family location: Home [facility] Provider location: [facility]  All persons participating in visit: Patient Veronica Hendrix and [facility] Tereso Unangst   Types of Service: Individual psychotherapy  I connected with Breck M Pulliam and/or Jannette M Pulliam's n/a by Telephone  (Video is Caregility application) and verified that I am speaking with the correct person using two identifiers.Discussed confidentiality: Yes   I discussed the limitations of telemedicine and the availability of in person appointments.  Discussed there is a possibility of technology failure and discussed alternative modes of communication if that failure occurs.  I discussed that engaging in this telemedicine visit, they consent to the provision of behavioral healthcare and the services will be billed under their insurance.  Patient and/or legal guardian expressed understanding and consented to Telemedicine visit: Yes   Presenting Concerns: Patient and/or family reports the following symptoms/concerns: Pt states her primary concern today is feeling overwhelmed and irritable, attributed to conflict with husband and financial stress Duration of problem: Increase in current pregnancy; ongoing; Severity of problem: moderately severe  Patient and/or Family's Strengths/Protective Factors: Sense of purpose  Goals Addressed: Patient will: 1.  Reduce symptoms of: anxiety, depression and stress  2.  Increase knowledge and/or ability of: healthy habits, self-management skills and stress reduction  3.  Demonstrate ability to: Increase healthy adjustment to current life circumstances and Increase motivation to adhere to plan of  care  Progress towards Goals: Ongoing  Interventions: Interventions utilized:  Solution-Focused Strategies, Mindfulness or Management consultant, Psychoeducation and/or Health Education and Link to [facility] Standardized Assessments completed: Not Needed  Patient and/or Family Response: Pt agrees with treatment plan  Assessment: Patient currently experiencing Major depressive disorder, moderate, recurrent and Psychosocial stress.   Patient may benefit from psychoeducation and brief therapeutic interventions regarding coping with symptoms of depression, anxiety with panic, and stress .  Plan: 1. Follow up with behavioral health clinician on : Two weeks 2. Behavioral recommendations:  -Pick up and begin taking Zoloft as soon as able; take as prescribed -CALM relaxation breathing exercise twice daily(morning; at bedtime with sleep sounds) -Consider additional housing resources (listed on After Visit Summary) as needed -Sign [email or web address] consent sent to email for future referrals -Read educational materials regarding coping with symptoms of panic attacks (On AVS) -Read through Postpartum Planner (On AVS) -Consider apps (On AVS) for additional self-care 3. Referral(s): Integrated Art gallery manager (In Clinic) and [employer]:  Housing  I discussed the assessment and treatment plan with the patient and/or parent/guardian. They were provided an opportunity to ask questions and all were answered. They agreed with the plan and demonstrated an understanding of the instructions.   They were advised to call back or seek an in-person evaluation if the symptoms worsen or if the condition fails to improve as anticipated.  Rae Lips, LCSW   Depression screen [facility] 2/9 11/02/2020 09/13/2020 05/21/2018 04/17/2018 11/21/2017  Decreased Interest 1 1 0 1 1  Down, Depressed, Hopeless 1 0 0 1 1  PHQ - 2 Score 2 1 0 2 2  Altered sleeping 1 1 - 0 3  Tired, decreased energy 2 1 - 1  2  Change in appetite 1 1 - 1 1  Feeling bad or failure about yourself  2 1 - 0 1  Trouble concentrating 1 0 - 1 1  Moving slowly or fidgety/restless 0 0 - 0 0  Suicidal thoughts 0 0 - 0 0  PHQ-9 Score 9 5 - 5 10  Difficult doing work/chores - - - Somewhat difficult Somewhat difficult  Some encounter information is confidential and restricted. Go to Review Flowsheets activity to see all data.  Some recent data might be hidden   GAD 7 : Generalized Anxiety Score 11/02/2020 09/13/2020 11/07/2017 12/12/2016  Nervous, Anxious, on Edge 3 1 3 3   Control/stop worrying 3 1 3 3   Worry too much - different things 3 1 3 3   Trouble relaxing 2 1 3 3   Restless 2 1 3  0  Easily annoyed or irritable 3 1 3 2   Afraid - awful might happen 1 1 3 1   Total GAD 7 Score 17 7 21 15   Anxiety Difficulty - - Very difficult Very difficult  Some encounter information is confidential and restricted. Go to Review Flowsheets activity to see all data.

## 2020-12-18 ENCOUNTER — Ambulatory Visit (INDEPENDENT_AMBULATORY_CARE_PROVIDER_SITE_OTHER): Payer: Medicaid Other | Admitting: Clinical

## 2020-12-18 DIAGNOSIS — F331 Major depressive disorder, recurrent, moderate: Secondary | ICD-10-CM

## 2020-12-18 DIAGNOSIS — Z658 Other specified problems related to psychosocial circumstances: Secondary | ICD-10-CM

## 2020-12-18 DIAGNOSIS — Z3480 Encounter for supervision of other normal pregnancy, unspecified trimester: Secondary | ICD-10-CM | POA: Diagnosis not present

## 2020-12-18 NOTE — Patient Instructions (Signed)
Center for [facility] at [facility] [place], [place] [address] [phone] (main office) [phone] [facility] office)  [place] (serves [place], [place], [place], [place], [place], [place], [place], [place], [place], [place], [place], [place], and [place] counties) [address] [place], [place] [address] [phone] [email or web address]  **Rental assistance, Home Rehabilitation,Weatherization Assistance Program, Forensic psychologist, Housing Voucher Program  [employer] for Housing and [employer] Studies: Chief Financial Officer Resources to residents of [place], [place], and [place] [address]. Make sure you have your documents ready, including:  Marland Kitchen (Household income verification: 2 months pay stubs, unemployment/social security award letter, statement of no income for all household members over 52) . Photo ID for all household members over 41 . Utility Bill/Rent Ledger/Lease: must show past due amount for utilities/rent, or the rental agreement if rent is current 2. Start your application online or by paper (in [place] or [place]) at:     [email or web address]  3. Once you have completed the online application, you will get an email confirmation message from the county. Expect to hear back by phone or email at least 6-10 weeks from submitting your application.  4. While you wait:  . Call [phone] to check in on your application . Let your landlord know that you've applied. Your landlord will be asked to submit documents (W-9) during this application process. Payments will be made directly to the landlord/property [place] or utility assistance for [employer], [place], and [employer] . Apply at [email or web address] . Questions? Call or  email Renee at [phone] or [email or web address]   Eviction Mediation Program: The HOPE Program Https://www.rebuild.http://mills-williams.net/ HOPE Progam serves low-income renters in [place] counties, defined as less than or equal to 80% of the area median income for the county where the renter lives. In the following 12 counties, you should apply to your local rent and utility assistance program INSTEAD OF the HOPE Program: [place], [place], [place], [place], [place], [place], [place], [place], [place], [place], [place], [place]  . If you live outside of [facility], contact [place] call center at [phone] to talk to a Program Representative Monday-Friday, 8am-5pm . Note that Native American tribes also received federal funding for rent and utility assistance programs. Recognized members of the following tribes will be served by programs managed by tribal governments, including: [place], [place], [place], [place] of [place] and [place] . Eviction Mediation Coordinator, Renee, [phone] [email or web address] . Housing Resources Navigator, [place], [phone] [email or web address] .[place] [address], [place], [place] [address] [phone] www.gha-[place].[facility] [address] [place], [place] [address] [phone] https://manning.com/ **Programs include: Engineer, building services and Housing Counseling, Healthy Doctor, general practice, Homeless Prevention and [place] [address] [place], [place], [place] [address] [phone] www.https://www.farmer-stevens.info/ **housing applications/recertification; tax payment relief/exemption under specific qualifications  [facility] [address], [place], [place]  [address] www.onlinegreensboro.com/~maryshouse **transitional housing for women in recovery who have minor children or are pregnant  [place], [place], [place] [address] ArtistMovie.se  **emergency shelter and support services for families facing homelessness  [place] [address], [place], [place] [address] [phone] www.youthfocus.org **transitional housing to pregnant women;  emergency housing for youth who have run away, are experiencing a family crisis, are victims of abuse or neglect, or are homeless  [facility] [address] [place], [place] [address] [phone] ircgso.org **Drop-in center for people experiencing homelessness; overnight warming center when temperature is 25 degrees or below  [employer] Staffing [place], [place], [place] [address] [phone] https://reentrystaffingagency.org/ **help with affordable housing to people experiencing homelessness or unemployment due to incarceration  [facility] [address], [place], [place] [address] [phone] www.greensborourbanministry.org  **emergency and transitional housing, rent/mortgage assistance, utility assistance  [facility] [address], [place], [place] [address] [phone] www.salvationarmyofgreensboro.org **emergency and transitional housing  [employer] for [employer] [place], [place], [place] [address] [phone] Www.habitatgreensboro.[place], [place], [place] [address] [phone] https://chshousing.org **[place] and [facility]  [employer] [facility] [address] [place] [address], [place], [place] [address] [phone] arrivance.com **home buyer education courses, foreclosure prevention  [facility] [address] [place], [place] [address] [phone] WirelessNovelties.no **Environmental Exposure Assessment (investigation of homes where either children or pregnant women with a confirmed elevated blood lead level reside)  [facility] of [facility]ocational [facility] [place], [place], [place] [address] [phone] http://www.perez.com/ **Home Expense Assistance/Repairs Program; offers home accessibility updates, such as ramps or bars in the bathroom  [facility] [address] [place], [place] [address] [phone] https://www.self-help.org/locations/[place] **Offers credit-building and banking services to people unable to use [place] of [facility] [place], [place], [place] [address] [phone] RoulettePays.[email or web address]   [facility] [address] [place], [place] [address] [phone]  ClubMonetize.fr **housing applications/recertification, emergency and transitional housing  [employer] of [employer] [address], [place], [place] [address] [phone] www.odm-hp.org  **emergency and permanent housing; rent/mortgage payment assistance  Habitat for [employer], [place] and [place] [address], [place], [place] [address] [phone] ArchitectReviews.com.au  [facility] Services of the [place], [employer] [place], [place], [place] [address] www.familyservice-piedmont.org **emergency shelter for victims of domestic violence and sexual assault  [facility] [address], [place], [place] [address] [phone] www.senior-resources-guilford.org **Home expense assistance/repairs for older [place], [place], [place] [facility])  [facility] [address], [place], [place] [address] [phone] www.newrha.[facility] [address], [place], [place] [address] ([place] [address] [place], [place], [place] [address] [phone] www.co.[place]. [place].us **Housing applications/recertification; tax payment relief/exemption w specific qualifications  [place] for Homeless [address], [place], [place] [address] [phone] Http://www.rchelpforhomeless.org/HOME_PAGE.html  HELP, [place] [address], [place], [place] [address] [phone] [place] [phone] Hours of Operation: Monday-Friday, 8:30am-5:00pm http://helpincorporated.org **Includes emergency housing for victims of domestic violence  Coping with Panic Attacks   What is a panic attack?  You may have had a panic attack if you experienced four or more of the symptoms listed below coming on abruptly and peaking in about 10 minutes.  Panic Symptoms   . Pounding heart  . Sweating  . Trembling or shaking  . Shortness of breath  . Feeling of choking  . Chest pain  . Nausea or abdominal distress    .  Feeling dizzy, unsteady, lightheaded, or faint  . Feelings of unreality or being detached from yourself  . Fear of losing control or going crazy  . Fear of dying  . Numbness or tingling  . Chills or hot flashes      Panic attacks are sometimes accompanied by avoidance of certain places or situations. These are often situations that would be difficult to escape from or in which help might not be available. Examples might include crowded shopping malls, public transportation, restaurants, or driving.   Why do panic attacks occur?   Panic attacks are the body's alarm system gone awry. All of [place] have a built-in alarm system, powered by adrenaline, which increases our heart rate, breathing, and blood flow in response to  danger. Ordinarily, this 'danger response system' works well. In some people, however, the response is either out of proportion to whatever stress is going on, or may come out of the blue without any stress at all.   For example, if you are walking in the woods and see a bear coming your way, a variety of changes occur in your body to prepare you to either fight the danger or flee from the situation. Your heart rate will increase to get more blood flow around your body, your breathing rate will quicken so that more oxygen is available, and your muscles will tighten in order to be ready to fight or run. You may feel nauseated as blood flow leaves your stomach area and moves into your limbs. These bodily changes are all essential to helping you survive the dangerous situation. After the danger has passed, your body functions will begin to go back to normal. This is because your body also has a system for "recovering" by bringing your body back down to a normal state when the danger is over.   As you can see, the emergency response system is adaptive when there is, in fact, a "true" or "real" danger (e.g., bear). However, sometimes people find that their emergency response system is triggered in "everyday" situations where there really is no true physical danger (e.g., in a meeting, in the grocery store, while driving in normal traffic, etc.).   What triggers a panic attack?  Sometimes particularly stressful situations can trigger a panic attack. For example, an argument with your spouse or stressors at work can cause a stress response (activating the emergency response system) because you perceive it as threatening or overwhelming, even if there is no direct risk to your survival.  Sometimes panic attacks don't seem to be triggered by anything in particular- they may "come out of the blue". Somehow, the natural "fight or flight" emergency response system has gotten activated when there is no real danger. Why  does the body go into "emergency mode" when there is no real danger?   Often, people with panic attacks are frightened or alarmed by the physical sensations of the emergency response system. First, unexpected physical sensations are experienced (tightness in your chest or some shortness of breath). This then leads to feeling fearful or alarmed by these symptoms ("Something's wrong!", "Am I having a heart attack?", "Am I going to faint?") The mind perceives that there is a danger even though no real danger exists. This, in turn, activates the emergency response system ("fight or flight"), leading to a "full blown" panic attack. In summary, panic attacks occur when we misinterpret physical symptoms as signs of impending death, craziness, loss of control, embarrassment, or fear of fear. Sometimes you  may be aware of thoughts of danger that activate the emergency response system (for example, thinking "I'm having a heart attack" when you feel chest pressure or increased heart rate). At other times, however, you may not be aware of such thoughts. After several incidences of being afraid of physical sensations, anxiety and panic can occur in response to the initial sensations without conscious thoughts of danger. Instead, you just feel afraid or alarmed. In other words, the panic or fear may seem to occur "automatically" without you consciously telling yourself anything.   After having had one or more panic attacks, you may also become more focused on what is going on inside your body. You may scan your body and be more vigilant about noticing any symptoms that might signal the start of a panic attack. This makes it easier for panic attacks to happen again because you pick up on sensations you might otherwise not have noticed, and misinterpret them as something dangerous. A panic attack may then result.      How do I cope with panic attacks?  An important part of overcoming panic attacks involves re-interpreting  your body's physical reactions and teaching yourself ways to decrease the physical arousal. This can be done through practicing the cognitive and behavioral interventions below.   Research has found that over half of people who have panic attacks show some signs of hyperventilation or overbreathing. This can produce initial sensations that alarm you and lead to a panic attack. Overbreathing can also develop as part of the panic attack and make the symptoms worse. When people hyperventilate, certain blood vessels in the body become narrower. In particular, the brain may get slightly less oxygen. This can lead to the symptoms of dizziness, confusion, and lightheadedness that often occur during panic attacks. Other parts of the body may also get a bit less oxygen, which may lead to numbness or tingling in the hands or feet or the sensation of cold, clammy hands. It also may lead the heart to pump harder. Although these symptoms may be frightening and feel unpleasant, it is important to remember that hyperventilating is not dangerous. However, you can help overcome the unpleasantness of overbreathing by practicing Breathing Retraining.   Practice this basic technique three times a day, every day:  . Inhale. With your shoulders relaxed, inhale as slowly and deeply as you can while you count to six. If you can, use your diaphragm to fill your lungs with air.  . Hold. Keep the air in your lungs as you slowly count to four.  . Exhale. Slowly breath out as you count to six.  . Repeat. Do the inhale-hold-exhale cycle several times. Each time you do it, exhale for longer counts.  Like any new skill, Breathing Retraining requires practice. Try practicing this skill twice a day for several minutes. Initially, do not try this technique in specific situations or when you become frightened or have a panic attack. Begin by practicing in a quiet environment to build up your skill level so that you can later use it in time  of "emergency."   2. Decreasing Avoidance  Regardless of whether you can identify why you began having panic attacks or whether they seemed to come out of the blue, the places where you began having panic attacks often can become triggers themselves. It is not uncommon for individuals to begin to avoid the places where they have had panic attacks. Over time, the individual may begin to avoid more and more  places, thereby decreasing their activities and often negatively impacting their quality of life. To break the cycle of avoidance, it is important to first identify the places or situations that are being avoided, and then to do some "relearning."  To begin this intervention, first create a list of locations or situations that you tend to avoid. Then choose an avoided location or situation that you would like to target first. Now develop an "exposure hierarchy" for this situation or location. An "exposure hierarchy" is a list of actions that make you feel anxious in this situation. Order these actions from least to most anxiety-producing. It is often helpful to have the first item on your hierarchy involve thinking or imagining part of the feared/avoided situation.   Here is an example of an exposure hierarchy for decreasing avoidance of the grocery store. Note how it is ordered from the least amount of anxiety (at the top) to the most anxiety (at the bottom):  Marland Kitchen Think about going to the grocery store alone.  . Go to the grocery store with a friend or family member.  . Go to the grocery store alone to pick up a few small items (5-10 minutes in the store).  . Shopping for 10-20 minutes in the store alone.  . Doing the shopping for the week by myself (20-30 minutes in the store).   Your homework is to "expose" yourself to the lowest item on your hierarchy and use your breathing relaxation and coping statements (see below) to help you remain in the situation. Practice this several times during the  upcoming week. Once you have mastered each item with minimal anxiety, move on to the next higher action on your list.   Cognitive Interventions  2. Identify your negative self-talk Anxious thoughts can increase anxiety symptoms and panic. The first step in changing anxious thinking is to identify your own negative, alarming self-talk. Some common alarming thoughts:  . I'm having a heart attack.            . I must be going crazy. . I think I'm dying. Marland Kitchen People will think I'm crazy. . I'm going to pass our.  . Oh no- here it comes.  . I can't stand this.  Rene Paci got to get out of here!  2. Use positive coping statements Changing or disrupting a pattern of anxious thoughts by replacing them with more calming or supportive statements can help to divert a panic attack. Some common helpful coping statements:  . This is not an emergency.  . I don't like feeling this way, but I can accept it.  . I can feel like this and still be okay.  . This has happened before, and I was okay. I'll be okay this time, too.  . I can be anxious and still deal with this situation.        BRAINSTORMING  Develop a Plan Goals: . Provide a way to start conversation about your new life with a baby . Assist parents in recognizing and using resources within their reach . Help pave the way before birth for an easier period of transition afterwards.  Make a list of the following information to keep in a central location: . Full name of Mom and Partner: _____________________________________________ . 23 full name and Date of Birth: ___________________________________________ . Home Address: ___________________________________________________________ ________________________________________________________________________ . Home Phone: ____________________________________________________________ . Parents' cell numbers:  _____________________________________________________ ________________________________________________________________________ . Name and contact info for OB: ______________________________________________ . Name and contact info for Pediatrician:________________________________________ .  Contact info for Lactation Consultants: ________________________________________  REST and SLEEP *You each need at least 4-5 hours of uninterrupted sleep every day. Write specific names and contact information.* . How are you going to rest in the postpartum period? While partner's home? When partner returns to work? When you both return to work? Marland Kitchen Where will your baby sleep? Marland Kitchen Who is available to help during the day? Evening? Night? . Who could move in for a period to help support you? Marland Kitchen What are some ideas to help you get enough sleep? __________________________________________________________________________________________________________________________________________________________________________________________________________________________________________ NUTRITIOUS FOOD AND DRINK *Plan for meals before your baby is born so you can have healthy food to eat during the immediate postpartum period.* . Who will look after breakfast? Lunch? Dinner? List names and contact information. Brainstorm quick, healthy ideas for each meal. . What can you do before baby is born to prepare meals for the postpartum period? . How can others help you with meals? Marland Kitchen Which grocery stores provide online shopping and delivery? Marland Kitchen Which restaurants offer take-out or delivery  options? ______________________________________________________________________________________________________________________________________________________________________________________________________________________________________________________________________________________________________________________________________________________________________________________________________  CARE FOR MOM *It's important that mom is cared for and pampered in the postpartum period. Remember, the most important ways new mothers need care are: sleep, nutrition, gentle exercise, and time off.* . Who can come take care of mom during this period? Make a list of people with their contact information. . List some activities that make you feel cared for, rested, and energized? Who can make sure you have opportunities to do these things? . Does mom have a space of her very own within your home that's just for her? Make a "[facility]" where she can be comfortable, rest, and renew herself daily. ______________________________________________________________________________________________________________________________________________________________________________________________________________________________________________________________________________________________________________________________________________________________________________________________________    CARE FOR AND FEEDING BABY *Knowledgeable and encouraging people will offer the best support with regard to feeding your baby.* . Educate yourself and choose the best feeding option for your baby. . Make a list of people who will guide, support, and be a resource for you as your care for and feed your baby. (Friends that have breastfed or are currently breastfeeding, lactation consultants, breastfeeding support groups, etc.) . Consider a postpartum doula. (These websites can give you information: dona.org & BuyingShow.es) . Seek out  local breastfeeding resources like the breastfeeding support group at [employer] or [employer]. ______________________________________________________________________________________________________________________________________________________________________________________________________________________________________________________________________________________________________________________________________________________________________________________________________  Verner Chol AND ERRANDS . Who can help with a thorough cleaning before baby is born? . Make a list of people who will help with housekeeping and chores, like laundry, light cleaning, dishes, bathrooms, etc. . Who can run some errands for you? Marland Kitchen What can you do to make sure you are stocked with basic supplies before baby is born? . Who is going to do the shopping? ______________________________________________________________________________________________________________________________________________________________________________________________________________________________________________________________________________________________________________________________________________________________________________________________________     Family Adjustment *Nurture yourselves.it helps parents be more loving and allows for better bonding with their child.* . What sorts of things do you and partner enjoy doing together? Which activities help you to connect and strengthen your relationship? Make a list of those things. Make a list of people whom you trust to care for your baby so you can have some time together as a couple. . What types of things help partner feel connected to Mom? Make a list. . What needs will partner have in order to bond with baby? . Other children? Who will care for them when you go into labor and while you are in the hospital? . Think about what the needs of your older children might be.  Who can help you meet those needs? In what ways are you helping them prepare for bringing baby home? List some specific strategies you have for family adjustment. _______________________________________________________________________________________________________________________________________________________________________________________________________________________________________________________________________________________________________________________________________________  SUPPORT *Someone who can empathize with experiences normalizes your problems and makes them more bearable.* . Make a list of other friends, neighbors, and/or co-workers you know with infants (and small children, if applicable) with whom you can connect. . Make a list of local or online support groups, mom groups, etc. in which you can be involved. ______________________________________________________________________________________________________________________________________________________________________________________________________________________________________________________________________________________________________________________________________________________________________________________________________  Childcare Plans . Investigate and plan for childcare if mom is returning to work. . Talk about mom's concerns about her transition back to work. . Talk about partner's concerns regarding this transition.  Mental Health *Your mental health is one of the highest priorities for a pregnant or postpartum mom.* . 1 in 5 women experience anxiety and/or depression from the time of conception through the first year after birth. . Postpartum Mood Disorders are the #1 complication of pregnancy and childbirth and the suffering experienced by these mothers is not necessary! These illnesses are temporary and respond well to treatment, which often includes self-care, social support, talk therapy,  and medication when needed. . Women experiencing anxiety and depression often say things like: "I'm supposed to be happy.why do I feel so sad?", "Why can't I snap out of it?", "I'm having thoughts that scare me." . There is no need to be embarrassed if you are feeling these symptoms: o Overwhelmed, anxious, angry, sad, guilty, irritable, hopeless, exhausted but can't sleep o You are NOT alone. You are NOT to blame. With help, you WILL be well. . Where can I find help? Medical professionals such as your OB, midwife, gynecologist, family practitioner, primary care provider, pediatrician, or mental health providers; [facility] support groups: Feelings After Birth, Breastfeeding Support Group, Baby and Me Group, and Fit 4 Two exercise classes. . You have permission to ask for help. It will confirm your feelings, validate your experiences, share/learn coping strategies, and gain support and encouragement as you heal. You are important! BRAINSTORM . Make a list of local resources, including resources for mom and for partner. . Identify support groups. . Identify people to call late at night - include names and contact info. . Talk with partner about perinatal mood and anxiety disorders. . Talk with your OB, midwife, and doula about baby blues and about perinatal mood and anxiety disorders. . Talk with your pediatrician about perinatal mood and anxiety disorders.   Support & Sanity Savers   What do you really need?  . Basics . In preparing for a new baby, many expectant parents spend hours shopping for baby clothes, decorating the nursery, and deciding which car seat to buy. Yet most don't think much about what the reality of parenting a newborn will be like, and what they need to make it through that. So, here is the advice of experienced parents. We know you'll read this, and think "they're exaggerating, I don't really need that." Just trust [place] on these, OK? Plan for all of this, and if it turns  out you don't need it, come back and teach [place] how you did it!  Satira Anis (Once baby's survival needs are met, make sure you attend to your own survival needs!) . Sleep . An average newborn sleeps 16-18 hours per day, over 6-7 sleep periods, rarely more than three hours at a time. It is normal and healthy for a newborn to wake throughout the night... but  really hard on parents!! . Naps. Prioritize sleep above any responsibilities like: cleaning house, visiting friends, running errands, etc.  Sleep whenever baby sleeps. If you can't nap, at least have restful times when baby eats. The more rest you get, the more patient you will be, the more emotionally stable, and better at solving problems.  . Food . You may not have realized it would be difficult to eat when you have a newborn. Yet, when we talk to . countless new parents, they say things like "it may be 2:00 pm when I realize I haven't had breakfast yet." Or "every time we sit down to dinner, baby needs to eat, and my food gets cold, so I don't bother to eat it." . Finger food. Before your baby is born, stock up with one months' worth of food that: 1) you can eat with one hand while holding a baby, 2) doesn't need to be prepped, 3) is good hot or cold, 4) doesn't spoil when left out for a few hours, and 5) you like to eat. Think about: nuts, dried fruit, Clif bars, pretzels, jerky, gogurt, baby carrots, apples, bananas, crackers, cheez-n-crackers, string cheese, hot pockets or frozen burritos to microwave, garden burgers and breakfast pastries to put in the toaster, yogurt drinks, etc. . Restaurant Menus. Make lists of your favorite restaurants & menu items. When family/friends want to help, you can give specific information without much thought. They can either bring you the food or send gift cards for just the right meals. Rosaura Carpenter Meals.  Take some time to make a few meals to put in the freezer ahead of time.  Easy to freeze meals can be  anything such as soup, lasagna, chicken pie, or spaghetti sauce. . Set up a Meal Schedule.  Ask friends and family to sign up to bring you meals during the first few weeks of being home. (It can be passed around at baby showers!) You have no idea how helpful this will be until you are in the throes of parenting.  https://hamilton-woodard.[email or web address] is a great website to check out. . Emotional Support . Know who to call when you're stressed out. Parenting a newborn is very challenging work. There are times when it totally overwhelms your normal coping abilities. EVERY NEW PARENT NEEDS TO HAVE A PLAN FOR WHO TO CALL WHEN THEY JUST CAN'T COPE ANY MORE. (And it has to be someone other than the baby's other parent!) Before your baby is born, come up with at least one person you can call for support - write their phone number down and post it on the refrigerator. Marland Kitchen Anxiety & Sadness. Baby blues are normal after pregnancy; however, there are more severe types of anxiety & sadness which can occur and should not be ignored.  They are always treatable, but you have to take the first step by reaching out for help. [facility] offers a "Mom Talk" group which meets every Tuesday from 10 am - 11 am.  This group is for new moms who need support and connection after their babies are born.  Call [phone].  Marland Kitchen Really, Really Helpful (Plan for them! Make sure these happen often!!) . Physical Support with Taking Care of Yourselves . Asking friends and family. Before your baby is born, set up a schedule of people who can come and visit and help out (or ask a friend to schedule for you). Any time someone says "let me know what I can do to help," sign them  up for a day. When they get there, their job is not to take care of the baby (that's your job and your joy). Their job is to take care of you!  . Postpartum doulas. If you don't have anyone you can call on for support, look into postpartum doulas:  professionals at helping parents  with caring for baby, caring for themselves, getting breastfeeding started, and helping with household tasks. www.padanc.org is a helpful website for learning about doulas in our area. . Peer Support / Parent Groups . Why: One of the greatest ideas for new parents is to be around other new parents. Parent groups give you a chance to share and listen to others who are going through the same season of life, get a sense of what is normal infant development by watching several babies learn and grow, share your stories of triumph and struggles with empathetic ears, and forgive your own mistakes when you realize all parents are learning by trial and error. . Where to find: There are many places you can meet other new parents throughout our community.  [facility] offers the following classes for new moms and their little ones:  Baby and Me (Birth to [place]) and Breastfeeding Support Group. Go to www.conehealthybaby.com or call [phone] for more information. . Time for your Relationship . It's easy to get so caught up in meeting baby's immediate needs that it's hard to find time to connect with your partner, and meet the needs of your relationship. It's also easy to forget what "quality time with your partner" actually looks like. If you take your baby on a date, you'd be amazed how much of your couple time is spent feeding the baby, diapering the baby, admiring the baby, and talking about the baby. . Dating: Try to take time for just the two of you. Babysitter tip: Sometimes when moms are breastfeeding a newborn, they find it hard to figure out how to schedule outings around baby's unpredictable feeding schedules. Have the babysitter come for a three hour period. When she comes over, if baby has just eaten, you can leave right away, and come back in two hours. If baby hasn't fed recently, you start the date at home. Once baby gets hungry and gets a good feeding in, you can head out for the rest of your  date time. . Date Nights at Home: If you can't get out, at least set aside one evening a week to prioritize your relationship: whenever baby dozes off or doesn't have any immediate needs, spend a little time focusing on each other. . Potential conflicts: The main relationship conflicts that come up for new parents are: issues related to sexuality, financial stresses, a feeling of an unfair division of household tasks, and conflicts in parenting styles. The more you can work on these issues before baby arrives, the better!  Clint Guy and Frills (Don't forget these. and don't feel guilty for indulging in them!) . Everyone has something in life that is a fun little treat that they do just for themselves. It may be: reading the morning paper, or going for a daily jog, or having coffee with a friend once a week, or going to a movie on Friday nights, or fine chocolates, or bubble baths, or curling up with a good book. . Unless you do fun things for yourself every now and then, it's hard to have the energy for fun with your baby. Whatever your "special" treats are, make sure you find a  way to continue to indulge in them after your baby is born. These special moments can recharge you, and allow you to return to baby with a new joy   PERINATAL MOOD DISORDERS: [place]   Emergency and Crisis Resources:  If you are an imminent risk to self or others, are experiencing intense personal distress, and/or have noticed significant changes in activities of daily living, call:  . 911 . [facility]: [phone] . Mobile Crisis: 331-190-1940 . National Suicide Hotline: (252) 218-0377 Or visit the following crisis centers: . Local Emergency Departments . [facility]: [address], [place]. Hours: 8:30AM-5PM. Insurance Accepted: Medicaid, Medicare, and Uninsured.  [facility]land [facility]  [address] [place] Mon-Friday 8am-3pm   [phone]                                                                                    Non-Crisis Resources: To identify specific providers that are covered by your insurance, contact your insurance company or local agencies: [place]: [phone] [facility]: [phone] [place]: [phone] Postpartum Support International- [place] 1-([phone]                                                      Outpatient therapy and medication management providers:  [facility] [phone] Hours: 9AM-5PM  Insurance Accepted: Alben Spittle, Lorella Nimrod, Freddrick March, [place], Medicare  [facility] ([place]) [phone] Hours: 8AM-5PM  nsurance Accepted: All insurances EXCEPT AARP, [place], [place], and [place]: [phone]             Hours: 8AM-8PM Insurance Accepted: Cristal Ford, Freddrick March, [place], Medicare, [phone] [facility]: [phone] Hours: 8:30AM-7PM Insurance Accepted: Cristal Ford, Medicaid, Medicare, Tricare, [employer] Counseling:  [place] Accepted:  Holland Falling, Lorella Nimrod, [facility], [place], [facility] [phone] Hours: 9AM-5:30PM Insurance Accepted: Alben Spittle, Charlotte Crumb, and Medicaid, Medicare, [employer] [facility]:  [phone] Hours: 9am-5pm Insurance Accepted: BCBS; they do not accept Medicaid/Medicare [place]: [phone] Hours: 9am-9pm Insurance Accepted: All major insurance including Medicaid and Medicare [facility]: [phone] Hours: 9AM-5:30PM Insurance Accepted: All insurances EXCEPT Medicaid and Medicare. [facility]: [place]: [phone] [place]:  [place] (support for children in the NICU and/or with special needs),  [employer]: [phone]                                                                                     Online Resources: Postpartum Support International: http://jones-berg.com/  390-300-9QZR 2Moms Supporting Moms:  Www.momssupportingmoms.net  /Emotional [employer] and Websites Here are a few free apps meant to help you to help yourself.  To find, try searching on the internet to see if the app is offered on Apple/Android devices. If your first choice doesn't come up on your device, the good news is that there are many choices! Play around with different apps to see which ones are helpful to you.    Calm This is an app meant to help increase calm feelings. Includes info, strategies, and tools for tracking your feelings.      Calm Harm  This app is meant to help with self-harm. Provides many 5-minute or 15-min coping strategies for doing instead of hurting yourself.       [place] is a problem-solving tool to help deal with emotions and cope with stress you encounter wherever you are.      MindShift This app can help people cope with anxiety. Rather than trying to avoid anxiety, you can make an important shift and face it.      MY3  MY3 features a support system, safety plan and resources with the goal of offering a tool to use in a time of need.       My Life My Voice  This mood journal offers a simple solution for tracking your thoughts, feelings and moods. Animated emoticons can help identify your mood.       Relax Melodies Designed to help with sleep, on this app you can mix sounds and meditations  for relaxation.      Smiling Mind Smiling Mind is meditation made easy: it's a simple tool that helps put a smile on your mind.        Stop, Breathe & Think  A friendly, simple guide for people through meditations for mindfulness and compassion.  Stop, Breathe and Think Kids Enter your current feelings and choose a "mission" to help you cope. Offers videos for certain moods instead of just sound recordings.       Team Orange The goal of this tool is to help teens change how they think, act, and react. This app helps you focus on your own good feelings and experiences.      The [place] Box The [place] Box (VHB) contains simple tools to help patients with coping, relaxation, distraction, and positive thinking.

## 2020-12-20 ENCOUNTER — Other Ambulatory Visit: Payer: Self-pay | Admitting: Advanced Practice Midwife

## 2020-12-20 DIAGNOSIS — Z363 Encounter for antenatal screening for malformations: Secondary | ICD-10-CM

## 2020-12-21 ENCOUNTER — Ambulatory Visit (INDEPENDENT_AMBULATORY_CARE_PROVIDER_SITE_OTHER): Payer: Medicaid Other | Admitting: Women's Health

## 2020-12-21 ENCOUNTER — Other Ambulatory Visit: Payer: Self-pay

## 2020-12-21 ENCOUNTER — Ambulatory Visit (INDEPENDENT_AMBULATORY_CARE_PROVIDER_SITE_OTHER): Payer: Medicaid Other

## 2020-12-21 ENCOUNTER — Encounter: Payer: Self-pay | Admitting: Women's Health

## 2020-12-21 VITALS — BP 115/71 | HR 95 | Wt 235.2 lb

## 2020-12-21 DIAGNOSIS — Z3480 Encounter for supervision of other normal pregnancy, unspecified trimester: Secondary | ICD-10-CM

## 2020-12-21 DIAGNOSIS — Z363 Encounter for antenatal screening for malformations: Secondary | ICD-10-CM

## 2020-12-21 DIAGNOSIS — Z1389 Encounter for screening for other disorder: Secondary | ICD-10-CM

## 2020-12-21 DIAGNOSIS — Z3482 Encounter for supervision of other normal pregnancy, second trimester: Secondary | ICD-10-CM

## 2020-12-21 DIAGNOSIS — O09299 Supervision of pregnancy with other poor reproductive or obstetric history, unspecified trimester: Secondary | ICD-10-CM

## 2020-12-21 DIAGNOSIS — Z1379 Encounter for other screening for genetic and chromosomal anomalies: Secondary | ICD-10-CM

## 2020-12-21 LAB — POCT URINALYSIS DIPSTICK OB
Blood, UA: NEGATIVE
Glucose, UA: NEGATIVE
Nitrite, UA: NEGATIVE

## 2020-12-21 NOTE — Progress Notes (Signed)
[place] 20+4 wks,breech,anterior placenta gr 0,normal ovaries,fhr 146 bpm,svp of fluid 5.1 cm,LVEICF 2 mm,cx 4.2 cm,EFW 426 g 87%,anatomy complete

## 2020-12-21 NOTE — Patient Instructions (Signed)
Veronica Hendrix, I greatly value your feedback.  If you receive a survey following your visit with [place] today, we appreciate you taking the time to fill it out.  Thanks, [name], CNM, WHNP-BC  [facility] ([address] [place], [place] [address]) Entrance C, located off of [address] valet parking  Go to [email or web address] to register for FREE online childbirth classes  [place] [facility]ians/Family Doctors:  [facility] [phone]            [facility] [phone]                 [facility] [phone] (usually not accepting new patients unless you have family there already, you are always welcome to call and ask)       [facility] Department [phone]       [facility] Pediatricians/Family Doctors:   [facility]: [phone]  [facility]: [phone]  [facility]: [phone]  [facility] Doctors:   [facility]: [phone]   [facility]: [phone]  [facility] Doctors:  [facility]: [phone]    Home Blood Pressure Monitoring for Patients   Your provider has recommended that you check your blood pressure (BP) at least once a week at home. If you do not have a blood pressure cuff at home, one will be provided for you. Contact your provider if you have not received your monitor within 1 week.   Helpful Tips for Accurate Home Blood Pressure Checks  . Don't smoke, exercise, or drink caffeine 30 minutes before checking your BP . Use the restroom before checking your BP (a full bladder can raise your pressure) . Relax in a comfortable upright chair . Feet on the ground . Left arm resting comfortably on a flat surface at the level of your heart . Legs uncrossed . Back supported . Sit quietly and don't talk . Place the cuff on your bare arm . Adjust snuggly, so  that only two fingertips can fit between your skin and the top of the cuff . Check 2 readings separated by at least one minute . Keep a log of your BP readings . For a visual, please reference this diagram: http://ccnc.care/bpdiagram  Provider Name: [facility]     Phone: [phone]  Zone 1: ALL CLEAR  Continue to monitor your symptoms:  . BP reading is less than 140 (top number) or less than 90 (bottom number)  . No right upper stomach pain . No headaches or seeing spots . No feeling nauseated or throwing up . No swelling in face and hands  Zone 2: CAUTION Call your doctor's office for any of the following:  . BP reading is greater than 140 (top number) or greater than 90 (bottom number)  . Stomach pain under your ribs in the middle or right side . Headaches or seeing spots . Feeling nauseated or throwing up . Swelling in face and hands  Zone 3: EMERGENCY  Seek immediate medical care if you have any of the following:  . BP reading is greater than160 (top number) or greater than 110 (bottom number) . Severe headaches not improving with Tylenol . Serious difficulty catching your breath . Any worsening symptoms from Zone 2     Second Trimester of Pregnancy The second trimester is from week 14 through week 27 (months 4 through 6). The second trimester is often a time when you feel your  best. Your body has adjusted to being pregnant, and you begin to feel better physically. Usually, morning sickness has lessened or quit completely, you may have more energy, and you may have an increase in appetite. The second trimester is also a time when the fetus is growing rapidly. At the end of the sixth month, the fetus is about 9 inches long and weighs about 1 pounds. You will likely begin to feel the baby move (quickening) between 16 and 20 weeks of pregnancy. Body changes during your second trimester Your body continues to go through many changes during your second trimester. The  changes vary from woman to woman.  Your weight will continue to increase. You will notice your lower abdomen bulging out.  You may begin to get stretch marks on your hips, abdomen, and breasts.  You may develop headaches that can be relieved by medicines. The medicines should be approved by your health care provider.  You may urinate more often because the fetus is pressing on your bladder.  You may develop or continue to have heartburn as a result of your pregnancy.  You may develop constipation because certain hormones are causing the muscles that push waste through your intestines to slow down.  You may develop hemorrhoids or swollen, bulging veins (varicose veins).  You may have back pain. This is caused by: ? Weight gain. ? Pregnancy hormones that are relaxing the joints in your pelvis. ? A shift in weight and the muscles that support your balance.  Your breasts will continue to grow and they will continue to become tender.  Your gums may bleed and may be sensitive to brushing and flossing.  Dark spots or blotches (chloasma, mask of pregnancy) may develop on your face. This will likely fade after the baby is born.  A dark line from your belly button to the pubic area (linea nigra) may appear. This will likely fade after the baby is born.  You may have changes in your hair. These can include thickening of your hair, rapid growth, and changes in texture. Some women also have hair loss during or after pregnancy, or hair that feels dry or thin. Your hair will most likely return to normal after your baby is born.  What to expect at prenatal visits During a routine prenatal visit:  You will be weighed to make sure you and the fetus are growing normally.  Your blood pressure will be taken.  Your abdomen will be measured to track your baby's growth.  The fetal heartbeat will be listened to.  Any test results from the previous visit will be discussed.  Your health care  provider may ask you:  How you are feeling.  If you are feeling the baby move.  If you have had any abnormal symptoms, such as leaking fluid, bleeding, severe headaches, or abdominal cramping.  If you are using any tobacco products, including cigarettes, chewing tobacco, and electronic cigarettes.  If you have any questions.  Other tests that may be performed during your second trimester include:  Blood tests that check for: ? Low iron levels (anemia). ? High blood sugar that affects pregnant women (gestational diabetes) between 71 and 28 weeks. ? Rh antibodies. This is to check for a protein on red blood cells (Rh factor).  Urine tests to check for infections, diabetes, or protein in the urine.  An ultrasound to confirm the proper growth and development of the baby.  An amniocentesis to check for possible genetic problems.  Fetal  screens for spina bifida and Down syndrome.  HIV (human immunodeficiency virus) testing. Routine prenatal testing includes screening for HIV, unless you choose not to have this test.  Follow these instructions at home: Medicines  Follow your health care provider's instructions regarding medicine use. Specific medicines may be either safe or unsafe to take during pregnancy.  Take a prenatal vitamin that contains at least 600 micrograms (mcg) of folic acid.  If you develop constipation, try taking a stool softener if your health care provider approves. Eating and drinking  Eat a balanced diet that includes fresh fruits and vegetables, whole grains, good sources of protein such as meat, eggs, or tofu, and low-fat dairy. Your health care provider will help you determine the amount of weight gain that is right for you.  Avoid raw meat and uncooked cheese. These carry germs that can cause birth defects in the baby.  If you have low calcium intake from food, talk to your health care provider about whether you should take a daily calcium  supplement.  Limit foods that are high in fat and processed sugars, such as fried and sweet foods.  To prevent constipation: ? Drink enough fluid to keep your urine clear or pale yellow. ? Eat foods that are high in fiber, such as fresh fruits and vegetables, whole grains, and beans. Activity  Exercise only as directed by your health care provider. Most women can continue their usual exercise routine during pregnancy. Try to exercise for 30 minutes at least 5 days a week. Stop exercising if you experience uterine contractions.  Avoid heavy lifting, wear low heel shoes, and practice good posture.  A sexual relationship may be continued unless your health care provider directs you otherwise. Relieving pain and discomfort  Wear a good support bra to prevent discomfort from breast tenderness.  Take warm sitz baths to soothe any pain or discomfort caused by hemorrhoids. Use hemorrhoid cream if your health care provider approves.  Rest with your legs elevated if you have leg cramps or low back pain.  If you develop varicose veins, wear support hose. Elevate your feet for 15 minutes, 3-4 times a day. Limit salt in your diet. Prenatal Care  Write down your questions. Take them to your prenatal visits.  Keep all your prenatal visits as told by your health care provider. This is important. Safety  Wear your seat belt at all times when driving.  Make a list of emergency phone numbers, including numbers for family, friends, the hospital, and police and fire departments. General instructions  Ask your health care provider for a referral to a local prenatal education class. Begin classes no later than the beginning of month 6 of your pregnancy.  Ask for help if you have counseling or nutritional needs during pregnancy. Your health care provider can offer advice or refer you to specialists for help with various needs.  Do not use hot tubs, steam rooms, or saunas.  Do not douche or use  tampons or scented sanitary pads.  Do not cross your legs for long periods of time.  Avoid cat litter boxes and soil used by cats. These carry germs that can cause birth defects in the baby and possibly loss of the fetus by miscarriage or stillbirth.  Avoid all smoking, herbs, alcohol, and unprescribed drugs. Chemicals in these products can affect the formation and growth of the baby.  Do not use any products that contain nicotine or tobacco, such as cigarettes and e-cigarettes. If you need help  quitting, ask your health care provider.  Visit your dentist if you have not gone yet during your pregnancy. Use a soft toothbrush to brush your teeth and be gentle when you floss. Contact a health care provider if:  You have dizziness.  You have mild pelvic cramps, pelvic pressure, or nagging pain in the abdominal area.  You have persistent nausea, vomiting, or diarrhea.  You have a bad smelling vaginal discharge.  You have pain when you urinate. Get help right away if:  You have a fever.  You are leaking fluid from your vagina.  You have spotting or bleeding from your vagina.  You have severe abdominal cramping or pain.  You have rapid weight gain or weight loss.  You have shortness of breath with chest pain.  You notice sudden or extreme swelling of your face, hands, ankles, feet, or legs.  You have not felt your baby move in over an hour.  You have severe headaches that do not go away when you take medicine.  You have vision changes. Summary  The second trimester is from week 14 through week 27 (months 4 through 6). It is also a time when the fetus is growing rapidly.  Your body goes through many changes during pregnancy. The changes vary from woman to woman.  Avoid all smoking, herbs, alcohol, and unprescribed drugs. These chemicals affect the formation and growth your baby.  Do not use any tobacco products, such as cigarettes, chewing tobacco, and e-cigarettes. If you  need help quitting, ask your health care provider.  Contact your health care provider if you have any questions. Keep all prenatal visits as told by your health care provider. This is important. This information is not intended to replace advice given to you by your health care provider. Make sure you discuss any questions you have with your health care provider. Document Released: 10/01/2001 Document Revised: 03/14/2016 Document Reviewed: 12/08/2012 Elsevier Interactive Patient Education  2017 [employer]   

## 2020-12-21 NOTE — Progress Notes (Addendum)
LOW-RISK PREGNANCY VISIT Patient name: Veronica Hendrix MRN [id number]  Date of birth: 12-Oct-1986 Chief Complaint:   Routine Prenatal Visit and Pregnancy Ultrasound  History of Present Illness:   Veronica Hendrix is a 35 y.o. G56P1102 female at [redacted]w[redacted]d with an Estimated Date of Delivery: 05/06/21 being seen today for ongoing management of a low-risk pregnancy.  Depression screen [facility] 2/9 11/02/2020 09/13/2020 05/21/2018 04/17/2018 11/21/2017  Decreased Interest 1 1 0 1 1  Down, Depressed, Hopeless 1 0 0 1 1  PHQ - 2 Score 2 1 0 2 2  Altered sleeping 1 1 - 0 3  Tired, decreased energy 2 1 - 1 2  Change in appetite 1 1 - 1 1  Feeling bad or failure about yourself  2 1 - 0 1  Trouble concentrating 1 0 - 1 1  Moving slowly or fidgety/restless 0 0 - 0 0  Suicidal thoughts 0 0 - 0 0  PHQ-9 Score 9 5 - 5 10  Difficult doing work/chores - - - Somewhat difficult Somewhat difficult  Some encounter information is confidential and restricted. Go to Review Flowsheets activity to see all data.  Some recent data might be hidden    Today she reports no complaints. Contractions: Not present. Vag. Bleeding: None.  Movement: Present. denies leaking of fluid. Review of Systems:   Pertinent items are noted in HPI Denies abnormal vaginal discharge w/ itching/odor/irritation, headaches, visual changes, shortness of breath, chest pain, abdominal pain, severe nausea/vomiting, or problems with urination or bowel movements unless otherwise stated above. Pertinent History Reviewed:  Reviewed past medical,surgical, social, obstetrical and family history.  Reviewed problem list, medications and allergies. Physical Assessment:   Vitals:   12/21/20 1145  BP: 115/71  Pulse: 95  Weight: 235 lb 3.2 oz (106.7 kg)  Body mass index is 39.14 kg/m.        Physical Examination:   General appearance: Well appearing, and in no distress  Mental status: Alert, oriented to person, place, and time  Skin: Warm & dry  Cardiovascular:  Normal heart rate noted  Respiratory: Normal respiratory effort, no distress  Abdomen: Soft, gravid, nontender  Pelvic: Cervical exam deferred         Extremities: Edema: None  Fetal Status: Fetal Heart Rate (bpm): 146 u/s   Movement: Present    [place] 20+4 wks,breech,anterior placenta gr 0,normal ovaries,fhr 146 bpm,svp of fluid 5.1 cm,LVEICF 2 mm,cx 4.2 cm,EFW 426 g 87%,AC 91%,anatomy complete  Chaperone: N/A   Results for orders placed or performed in visit on 12/21/20 (from the past 24 hour(s))  POC Urinalysis Dipstick OB   Collection Time: 12/21/20 11:57 AM  Result Value Ref Range   Color, UA     Clarity, UA     Glucose, UA Negative Negative   Bilirubin, UA     Ketones, UA trace    Spec Grav, UA     Blood, UA neg    pH, UA     POC,PROTEIN,UA Trace Negative, Trace, Small (1+), Moderate (2+), Large (3+), 4+   Urobilinogen, UA     Nitrite, UA neg    Leukocytes, UA Moderate (2+) (A) Negative   Appearance     Odor      Assessment & Plan:  1) Low-risk pregnancy [id number] at [redacted]w[redacted]d with an Estimated Date of Delivery: 05/06/21   2) H/O severe pre-e w/ 36wk IOL, on ASA 162mg  daily, plan EFW @ 32wks per MFM  3) H/O A1DM> early A1C normal  4)  Dep/anx> restarted zoloft about a week ago, seeing Jamie/IBH  5) Fetal isolated EICF> NIPT neg, discussed and gave printed info   Meds: No orders of the defined types were placed in this encounter.  Labs/procedures today: U/S and labs  Plan:  Continue routine obstetrical care  Next visit: prefers in person    Reviewed: Preterm labor symptoms and general obstetric precautions including but not limited to vaginal bleeding, contractions, leaking of fluid and fetal movement were reviewed in detail with the patient.  All questions were answered. Has home bp cuff. Check bp weekly, let us know if >140/90.   Follow-up: Return in about 4 weeks (around 01/18/2021) for LROB, CNM, in person.  Future Appointments  Date Time Provider Department Center   01/04/2021 10:15 AM [facility] CLINIC[facility]  01/18/2021 10:30 AM Cheral Marker, CNM [facility]    Orders Placed This Encounter  Procedures   AFP, Serum, Open Spina Bifida   POC Urinalysis Dipstick OB   Cheral Marker CNM, [facility] 12/21/2020 12:38 PM

## 2020-12-22 LAB — GC/CHLAMYDIA PROBE AMP
Chlamydia trachomatis, NAA: NEGATIVE
Neisseria Gonorrhoeae by PCR: NEGATIVE

## 2020-12-23 LAB — URINE CULTURE

## 2020-12-25 ENCOUNTER — Other Ambulatory Visit: Payer: Self-pay | Admitting: Women's Health

## 2020-12-25 MED ORDER — NITROFURANTOIN MONOHYD MACRO 100 MG PO CAPS: 100.0000 mg | ORAL_CAPSULE | Freq: Two times a day (BID) | ORAL | 0 refills | Status: DC

## 2020-12-26 NOTE — BH Specialist Note (Signed)
Pt did not arrive to video visit and did not answer the phone ; Left HIPPA-compliant message to call back Alfred Eckley from [facility] at [phone] (main office) or [phone] (Milanni Ayub's office).  ; left MyChart message for patient.      

## 2020-12-28 LAB — AFP, SERUM, OPEN SPINA BIFIDA
AFP MoM: 0.67
AFP Value: 31 ng/mL
Gest. Age on Collection Date: 20.4 weeks
Maternal Age At EDD: 33.8 yr
OSBR Risk 1 IN: 10000
Test Results:: NEGATIVE
Weight: 235 [lb_av]

## 2021-01-04 ENCOUNTER — Ambulatory Visit: Payer: Self-pay | Admitting: Clinical

## 2021-01-04 DIAGNOSIS — Z5329 Procedure and treatment not carried out because of patient's decision for other reasons: Secondary | ICD-10-CM

## 2021-01-04 DIAGNOSIS — Z91199 Patient's noncompliance with other medical treatment and regimen due to unspecified reason: Secondary | ICD-10-CM

## 2021-01-18 ENCOUNTER — Ambulatory Visit (INDEPENDENT_AMBULATORY_CARE_PROVIDER_SITE_OTHER): Payer: Medicaid Other | Admitting: Women's Health

## 2021-01-18 ENCOUNTER — Other Ambulatory Visit: Payer: Self-pay

## 2021-01-18 ENCOUNTER — Encounter: Payer: Self-pay | Admitting: Women's Health

## 2021-01-18 VITALS — BP 122/67 | HR 106 | Wt 240.0 lb

## 2021-01-18 DIAGNOSIS — Z3482 Encounter for supervision of other normal pregnancy, second trimester: Secondary | ICD-10-CM

## 2021-01-18 NOTE — Patient Instructions (Signed)
Veronica Hendrix, I greatly value your feedback.  If you receive a survey following your visit with [place] today, we appreciate you taking the time to fill it out.  Thanks, [name], CNM, WHNP-BC   You will have your sugar test next visit.  Please do not eat or drink anything after midnight the night before you come, not even water.  You will be here for at least two hours.  Please make an appointment online for the bloodwork at [email or web address] for 8:30am (or as close to this as possible). Make sure you select the [facility] service center. The day of the appointment, check in with our office first, then you will go to [facility] to start the sugar test.    [facility] [place], [place] [address]) Entrance C, located off of [address] valet parking  Go to [email or web address] to register for FREE online childbirth classes   Call the office [phone]) or go to [facility] if:  You begin to have strong, frequent contractions  Your water breaks.  Sometimes it is a big gush of fluid, sometimes it is just a trickle that keeps getting your panties wet or running down your legs  You have vaginal bleeding.  It is normal to have a small amount of spotting if your cervix was checked.   You don't feel your baby moving like normal.  If you don't, get you something to eat and drink and lay down and focus on feeling your baby move.   If your baby is still not moving like normal, you should call the office or go to [facility].  [place] Pediatricians/Family Doctors:  [facility] [phone]            [facility] [phone]                 [facility] [phone] (usually not accepting new patients unless you have family there already, you are always welcome to call and ask)       [facility] [phone]       [facility] Pediatricians/Family Doctors:   [facility]:  [phone]  [facility]: [phone]  [facility]: [phone]  [facility] Doctors:   [facility]: [phone]   [facility]: [phone]  [facility] Doctors:  [facility]: [phone]   Home Blood Pressure Monitoring for Patients   Your provider has recommended that you check your blood pressure (BP) at least once a week at home. If you do not have a blood pressure cuff at home, one will be provided for you. Contact your provider if you have not received your monitor within 1 week.   Helpful Tips for Accurate Home Blood Pressure Checks  . Don't smoke, exercise, or drink caffeine 30 minutes before checking your BP . Use the restroom before checking your BP (a full bladder can raise your pressure) . Relax in a comfortable upright chair . Feet on the ground . Left arm resting comfortably on a flat surface at the level of your heart . Legs uncrossed . Back supported . Sit quietly and don't talk . Place the cuff on your bare arm . Adjust snuggly, so that only two fingertips can fit between your skin and the top of the cuff . Check 2 readings separated by at least one minute . Keep a log of your BP readings . For a visual, please  reference this diagram: http://ccnc.care/bpdiagram  Provider Name: [facility]     Phone: [phone]  Zone 1: ALL CLEAR  Continue to monitor your symptoms:  . BP reading is less than 140 (top number) or less than 90 (bottom number)  . No right upper stomach pain . No headaches or seeing spots . No feeling nauseated or throwing up . No swelling in face and hands  Zone 2: CAUTION Call your doctor's office for any of the following:  . BP reading is greater than 140 (top number) or greater than 90 (bottom number)  . Stomach pain under your ribs in the middle or right side . Headaches or seeing spots . Feeling nauseated or throwing up . Swelling in  face and hands  Zone 3: EMERGENCY  Seek immediate medical care if you have any of the following:  . BP reading is greater than160 (top number) or greater than 110 (bottom number) . Severe headaches not improving with Tylenol . Serious difficulty catching your breath . Any worsening symptoms from Zone 2   Second Trimester of Pregnancy The second trimester is from week 13 through week 28, months 4 through 6. The second trimester is often a time when you feel your best. Your body has also adjusted to being pregnant, and you begin to feel better physically. Usually, morning sickness has lessened or quit completely, you may have more energy, and you may have an increase in appetite. The second trimester is also a time when the fetus is growing rapidly. At the end of the sixth month, the fetus is about 9 inches long and weighs about 1 pounds. You will likely begin to feel the baby move (quickening) between 18 and 20 weeks of the pregnancy. BODY CHANGES Your body goes through many changes during pregnancy. The changes vary from woman to woman.   Your weight will continue to increase. You will notice your lower abdomen bulging out.  You may begin to get stretch marks on your hips, abdomen, and breasts.  You may develop headaches that can be relieved by medicines approved by your health care provider.  You may urinate more often because the fetus is pressing on your bladder.  You may develop or continue to have heartburn as a result of your pregnancy.  You may develop constipation because certain hormones are causing the muscles that push waste through your intestines to slow down.  You may develop hemorrhoids or swollen, bulging veins (varicose veins).  You may have back pain because of the weight gain and pregnancy hormones relaxing your joints between the bones in your pelvis and as a result of a shift in weight and the muscles that support your balance.  Your breasts will continue to grow  and be tender.  Your gums may bleed and may be sensitive to brushing and flossing.  Dark spots or blotches (chloasma, mask of pregnancy) may develop on your face. This will likely fade after the baby is born.  A dark line from your belly button to the pubic area (linea nigra) may appear. This will likely fade after the baby is born.  You may have changes in your hair. These can include thickening of your hair, rapid growth, and changes in texture. Some women also have hair loss during or after pregnancy, or hair that feels dry or thin. Your hair will most likely return to normal after your baby is born. WHAT TO EXPECT AT YOUR PRENATAL VISITS During a routine prenatal visit:  You  will be weighed to make sure you and the fetus are growing normally.  Your blood pressure will be taken.  Your abdomen will be measured to track your baby's growth.  The fetal heartbeat will be listened to.  Any test results from the previous visit will be discussed. Your health care provider may ask you:  How you are feeling.  If you are feeling the baby move.  If you have had any abnormal symptoms, such as leaking fluid, bleeding, severe headaches, or abdominal cramping.  If you have any questions. Other tests that may be performed during your second trimester include:  Blood tests that check for:  Low iron levels (anemia).  Gestational diabetes (between 24 and 28 weeks).  Rh antibodies.  Urine tests to check for infections, diabetes, or protein in the urine.  An ultrasound to confirm the proper growth and development of the baby.  An amniocentesis to check for possible genetic problems.  Fetal screens for spina bifida and Down syndrome. HOME CARE INSTRUCTIONS   Avoid all smoking, herbs, alcohol, and unprescribed drugs. These chemicals affect the formation and growth of the baby.  Follow your health care provider's instructions regarding medicine use. There are medicines that are either  safe or unsafe to take during pregnancy.  Exercise only as directed by your health care provider. Experiencing uterine cramps is a good sign to stop exercising.  Continue to eat regular, healthy meals.  Wear a good support bra for breast tenderness.  Do not use hot tubs, steam rooms, or saunas.  Wear your seat belt at all times when driving.  Avoid raw meat, uncooked cheese, cat litter boxes, and soil used by cats. These carry germs that can cause birth defects in the baby.  Take your prenatal vitamins.  Try taking a stool softener (if your health care provider approves) if you develop constipation. Eat more high-fiber foods, such as fresh vegetables or fruit and whole grains. Drink plenty of fluids to keep your urine clear or pale yellow.  Take warm sitz baths to soothe any pain or discomfort caused by hemorrhoids. Use hemorrhoid cream if your health care provider approves.  If you develop varicose veins, wear support hose. Elevate your feet for 15 minutes, 3-4 times a day. Limit salt in your diet.  Avoid heavy lifting, wear low heel shoes, and practice good posture.  Rest with your legs elevated if you have leg cramps or low back pain.  Visit your dentist if you have not gone yet during your pregnancy. Use a soft toothbrush to brush your teeth and be gentle when you floss.  A sexual relationship may be continued unless your health care provider directs you otherwise.  Continue to go to all your prenatal visits as directed by your health care provider. SEEK MEDICAL CARE IF:   You have dizziness.  You have mild pelvic cramps, pelvic pressure, or nagging pain in the abdominal area.  You have persistent nausea, vomiting, or diarrhea.  You have a bad smelling vaginal discharge.  You have pain with urination. SEEK IMMEDIATE MEDICAL CARE IF:   You have a fever.  You are leaking fluid from your vagina.  You have spotting or bleeding from your vagina.  You have severe  abdominal cramping or pain.  You have rapid weight gain or loss.  You have shortness of breath with chest pain.  You notice sudden or extreme swelling of your face, hands, ankles, feet, or legs.  You have not felt your baby move  in over an hour.  You have severe headaches that do not go away with medicine.  You have vision changes. Document Released: 10/01/2001 Document Revised: 10/12/2013 Document Reviewed: 12/08/2012 [facility] Patient Information 2015 [place], [place]. This information is not intended to replace advice given to you by your health care provider. Make sure you discuss any questions you have with your health care provider.

## 2021-01-18 NOTE — Progress Notes (Signed)
LOW-RISK PREGNANCY VISIT Patient name: Veronica Hendrix MRN [id number]  Date of birth: January 17, 1986 Chief Complaint:   Routine Prenatal Visit  History of Present Illness:   Veronica Hendrix is a 35 y.o. [id number] female at [redacted]w[redacted]d with an Estimated Date of Delivery: 05/06/21 being seen today for ongoing management of a low-risk pregnancy.  Depression screen [facility] 2/9 11/02/2020 09/13/2020 05/21/2018 04/17/2018 11/21/2017  Decreased Interest 1 1 0 1 1  Down, Depressed, Hopeless 1 0 0 1 1  PHQ - 2 Score 2 1 0 2 2  Altered sleeping 1 1 - 0 3  Tired, decreased energy 2 1 - 1 2  Change in appetite 1 1 - 1 1  Feeling bad or failure about yourself  2 1 - 0 1  Trouble concentrating 1 0 - 1 1  Moving slowly or fidgety/restless 0 0 - 0 0  Suicidal thoughts 0 0 - 0 0  PHQ-9 Score 9 5 - 5 10  Difficult doing work/chores - - - Somewhat difficult Somewhat difficult  Some encounter information is confidential and restricted. Go to Review Flowsheets activity to see all data.  Some recent data might be hidden    Today she reports had some cramping/spotting 2wks ago, none since. Never took macrobid for presumed uti couldn't come to office for- was unable to pick up rx- had some AZO which resolved sx. Contractions: Not present. Vag. Bleeding: None.  Movement: Present. denies leaking of fluid. Review of Systems:   Pertinent items are noted in HPI Denies abnormal vaginal discharge w/ itching/odor/irritation, headaches, visual changes, shortness of breath, chest pain, abdominal pain, severe nausea/vomiting, or problems with urination or bowel movements unless otherwise stated above. Pertinent History Reviewed:  Reviewed past medical,surgical, social, obstetrical and family history.  Reviewed problem list, medications and allergies. Physical Assessment:   Vitals:   01/18/21 1036  BP: 122/67  Pulse: (!) 106  Weight: 240 lb (108.9 kg)  Body mass index is 39.94 kg/m.        Physical Examination:   General appearance:  Well appearing, and in no distress  Mental status: Alert, oriented to person, place, and time  Skin: Warm & dry  Cardiovascular: Normal heart rate noted  Respiratory: Normal respiratory effort, no distress  Abdomen: Soft, gravid, nontender  Pelvic: Cervical exam deferred         Extremities: Edema: Trace  Fetal Status:     Movement: Present    Chaperone: N/A   No results found for this or any previous visit (from the past 24 hour(s)).  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  Labs/procedures today: none  Plan:  Continue routine obstetrical care  Next visit: prefers will be in person for pn2    Reviewed: Preterm labor symptoms and general obstetric precautions including but not limited to vaginal bleeding, contractions, leaking of fluid and fetal movement were reviewed in detail with the patient.  All questions were answered..  Follow-up: Return in about 4 weeks (around 02/15/2021) for LROB, PN2, CNM, in person.  Future Appointments  Date Time Provider Department Center  02/19/2021  8:30 AM [facility]    No orders of the defined types were placed in this encounter.  Cheral Marker CNM, [facility] 01/18/2021  11:17 AM

## 2021-02-19 ENCOUNTER — Ambulatory Visit (INDEPENDENT_AMBULATORY_CARE_PROVIDER_SITE_OTHER): Payer: Medicaid Other | Admitting: Women's Health

## 2021-02-19 ENCOUNTER — Encounter: Payer: Self-pay | Admitting: Women's Health

## 2021-02-19 ENCOUNTER — Other Ambulatory Visit: Payer: Self-pay

## 2021-02-19 ENCOUNTER — Other Ambulatory Visit: Payer: Medicaid Other

## 2021-02-19 VITALS — BP 135/76 | HR 106 | Wt 247.4 lb

## 2021-02-19 DIAGNOSIS — Z131 Encounter for screening for diabetes mellitus: Secondary | ICD-10-CM

## 2021-02-19 DIAGNOSIS — Z3A28 28 weeks gestation of pregnancy: Secondary | ICD-10-CM

## 2021-02-19 DIAGNOSIS — Z3483 Encounter for supervision of other normal pregnancy, third trimester: Secondary | ICD-10-CM

## 2021-02-19 DIAGNOSIS — Z23 Encounter for immunization: Secondary | ICD-10-CM

## 2021-02-19 DIAGNOSIS — Z3480 Encounter for supervision of other normal pregnancy, unspecified trimester: Secondary | ICD-10-CM

## 2021-02-19 DIAGNOSIS — Z8759 Personal history of other complications of pregnancy, childbirth and the puerperium: Secondary | ICD-10-CM

## 2021-02-19 NOTE — Patient Instructions (Signed)
Veronica Hendrix, I greatly value your feedback.  If you receive a survey following your visit with [place] today, we appreciate you taking the time to fill it out.  Thanks, [name], CNM, WHNP-BC   [facility] ([address] [place], [place] [address]) Entrance C, located off of [address] valet parking  Go to [email or web address] to register for FREE online childbirth classes   Call the office [phone]) or go to [facility] if:  You begin to have strong, frequent contractions  Your water breaks.  Sometimes it is a big gush of fluid, sometimes it is just a trickle that keeps getting your panties wet or running down your legs  You have vaginal bleeding.  It is normal to have a small amount of spotting if your cervix was checked.   You don't feel your baby moving like normal.  If you don't, get you something to eat and drink and lay down and focus on feeling your baby move.  You should feel at least 10 movements in 2 hours.  If you don't, you should call the office or go to [facility]    Tdap Vaccine  It is recommended that you get the Tdap vaccine during the third trimester of EACH pregnancy to help protect your baby from getting pertussis (whooping cough)  27-36 weeks is the BEST time to do this so that you can pass the protection on to your baby. During pregnancy is better than after pregnancy, but if you are unable to get it during pregnancy it will be offered at the hospital.   You can get this vaccine with [place], at the health department, your family doctor, or some local pharmacies  Everyone who will be around your baby should also be up-to-date on their vaccines before the baby comes. Adults (who are not pregnant) only need 1 dose of Tdap during adulthood.   [place] Pediatricians/Family Doctors:  [facility] [phone]            [facility] [phone]                 [facility]  [phone] (usually not accepting new patients unless you have family there already, you are always welcome to call and ask)       [facility] Department [phone]       [facility] Pediatricians/Family Doctors:   [facility]: [phone]  [facility]: [phone]  [facility]: [phone]  [facility] Doctors:   [facility]: [phone]   [facility]: [phone]  [facility] Doctors:  [facility]: [phone]   Home Blood Pressure Monitoring for Patients   Your provider has recommended that you check your blood pressure (BP) at least once a week at home. If you do not have a blood pressure cuff at home, one will be provided for you. Contact your provider if you have not received your monitor within 1 week.   Helpful Tips for Accurate Home Blood Pressure Checks  . Don't smoke, exercise, or drink caffeine 30 minutes before checking your BP . Use the restroom before checking your BP (a full bladder can raise your pressure) . Relax in a comfortable upright chair . Feet on the ground . Left arm resting comfortably on a flat surface at the level of your heart . Legs uncrossed . Back supported . Sit quietly and don't talk . Place the cuff on your  bare arm . Adjust snuggly, so that only two fingertips can fit between your skin and the top of the cuff . Check 2 readings separated by at least one minute . Keep a log of your BP readings . For a visual, please reference this diagram: http://ccnc.care/bpdiagram  Provider Name: [facility]     Phone: [phone]  Zone 1: ALL CLEAR  Continue to monitor your symptoms:  . BP reading is less than 140 (top number) or less than 90 (bottom number)  . No right upper stomach pain . No headaches or seeing spots . No feeling nauseated or throwing up . No swelling in face and hands  Zone 2: CAUTION Call your  doctor's office for any of the following:  . BP reading is greater than 140 (top number) or greater than 90 (bottom number)  . Stomach pain under your ribs in the middle or right side . Headaches or seeing spots . Feeling nauseated or throwing up . Swelling in face and hands  Zone 3: EMERGENCY  Seek immediate medical care if you have any of the following:  . BP reading is greater than160 (top number) or greater than 110 (bottom number) . Severe headaches not improving with Tylenol . Serious difficulty catching your breath . Any worsening symptoms from Zone 2   Third Trimester of Pregnancy The third trimester is from week 29 through week 42, months 7 through 9. The third trimester is a time when the fetus is growing rapidly. At the end of the ninth month, the fetus is about 20 inches in length and weighs 6-10 pounds.  BODY CHANGES Your body goes through many changes during pregnancy. The changes vary from woman to woman.   Your weight will continue to increase. You can expect to gain 25-35 pounds (11-16 kg) by the end of the pregnancy.  You may begin to get stretch marks on your hips, abdomen, and breasts.  You may urinate more often because the fetus is moving lower into your pelvis and pressing on your bladder.  You may develop or continue to have heartburn as a result of your pregnancy.  You may develop constipation because certain hormones are causing the muscles that push waste through your intestines to slow down.  You may develop hemorrhoids or swollen, bulging veins (varicose veins).  You may have pelvic pain because of the weight gain and pregnancy hormones relaxing your joints between the bones in your pelvis. Backaches may result from overexertion of the muscles supporting your posture.  You may have changes in your hair. These can include thickening of your hair, rapid growth, and changes in texture. Some women also have hair loss during or after pregnancy, or hair that  feels dry or thin. Your hair will most likely return to normal after your baby is born.  Your breasts will continue to grow and be tender. A yellow discharge may leak from your breasts called colostrum.  Your belly button may stick out.  You may feel short of breath because of your expanding uterus.  You may notice the fetus "dropping," or moving lower in your abdomen.  You may have a bloody mucus discharge. This usually occurs a few days to a week before labor begins.  Your cervix becomes thin and soft (effaced) near your due date. WHAT TO EXPECT AT YOUR PRENATAL EXAMS  You will have prenatal exams every 2 weeks until week 36. Then, you will have weekly prenatal exams. During a routine prenatal visit:  You will be weighed to make sure you and the fetus are growing normally.  Your blood pressure is taken.  Your abdomen will be measured to track your baby's growth.  The fetal heartbeat will be listened to.  Any test results from the previous visit will be discussed.  You may have a cervical check near your due date to see if you have effaced. At around 36 weeks, your caregiver will check your cervix. At the same time, your caregiver will also perform a test on the secretions of the vaginal tissue. This test is to determine if a type of bacteria, Group B streptococcus, is present. Your caregiver will explain this further. Your caregiver may ask you:  What your birth plan is.  How you are feeling.  If you are feeling the baby move.  If you have had any abnormal symptoms, such as leaking fluid, bleeding, severe headaches, or abdominal cramping.  If you have any questions. Other tests or screenings that may be performed during your third trimester include:  Blood tests that check for low iron levels (anemia).  Fetal testing to check the health, activity level, and growth of the fetus. Testing is done if you have certain medical conditions or if there are problems during the  pregnancy. FALSE LABOR You may feel small, irregular contractions that eventually go away. These are called Braxton Hicks contractions, or false labor. Contractions may last for hours, days, or even weeks before true labor sets in. If contractions come at regular intervals, intensify, or become painful, it is best to be seen by your caregiver.  SIGNS OF LABOR   Menstrual-like cramps.  Contractions that are 5 minutes apart or less.  Contractions that start on the top of the uterus and spread down to the lower abdomen and back.  A sense of increased pelvic pressure or back pain.  A watery or bloody mucus discharge that comes from the vagina. If you have any of these signs before the 37th week of pregnancy, call your caregiver right away. You need to go to the hospital to get checked immediately. HOME CARE INSTRUCTIONS   Avoid all smoking, herbs, alcohol, and unprescribed drugs. These chemicals affect the formation and growth of the baby.  Follow your caregiver's instructions regarding medicine use. There are medicines that are either safe or unsafe to take during pregnancy.  Exercise only as directed by your caregiver. Experiencing uterine cramps is a good sign to stop exercising.  Continue to eat regular, healthy meals.  Wear a good support bra for breast tenderness.  Do not use hot tubs, steam rooms, or saunas.  Wear your seat belt at all times when driving.  Avoid raw meat, uncooked cheese, cat litter boxes, and soil used by cats. These carry germs that can cause birth defects in the baby.  Take your prenatal vitamins.  Try taking a stool softener (if your caregiver approves) if you develop constipation. Eat more high-fiber foods, such as fresh vegetables or fruit and whole grains. Drink plenty of fluids to keep your urine clear or pale yellow.  Take warm sitz baths to soothe any pain or discomfort caused by hemorrhoids. Use hemorrhoid cream if your caregiver approves.  If you  develop varicose veins, wear support hose. Elevate your feet for 15 minutes, 3-4 times a day. Limit salt in your diet.  Avoid heavy lifting, wear low heal shoes, and practice good posture.  Rest a lot with your legs elevated if you have leg cramps or low  back pain.  Visit your dentist if you have not gone during your pregnancy. Use a soft toothbrush to brush your teeth and be gentle when you floss.  A sexual relationship may be continued unless your caregiver directs you otherwise.  Do not travel far distances unless it is absolutely necessary and only with the approval of your caregiver.  Take prenatal classes to understand, practice, and ask questions about the labor and delivery.  Make a trial run to the hospital.  Pack your hospital bag.  Prepare the baby's nursery.  Continue to go to all your prenatal visits as directed by your caregiver. SEEK MEDICAL CARE IF:  You are unsure if you are in labor or if your water has broken.  You have dizziness.  You have mild pelvic cramps, pelvic pressure, or nagging pain in your abdominal area.  You have persistent nausea, vomiting, or diarrhea.  You have a bad smelling vaginal discharge.  You have pain with urination. SEEK IMMEDIATE MEDICAL CARE IF:   You have a fever.  You are leaking fluid from your vagina.  You have spotting or bleeding from your vagina.  You have severe abdominal cramping or pain.  You have rapid weight loss or gain.  You have shortness of breath with chest pain.  You notice sudden or extreme swelling of your face, hands, ankles, feet, or legs.  You have not felt your baby move in over an hour.  You have severe headaches that do not go away with medicine.  You have vision changes. Document Released: 10/01/2001 Document Revised: 10/12/2013 Document Reviewed: 12/08/2012 [facility] Patient Information 2015 [place], [place]. This information is not intended to replace advice given to you by your health  care provider. Make sure you discuss any questions you have with your health care provider.

## 2021-02-19 NOTE — Progress Notes (Signed)
LOW-RISK PREGNANCY VISIT Patient name: Veronica Hendrix MRN [id number]  Date of birth: 07-14-1986 Chief Complaint:   Routine Prenatal Visit  History of Present Illness:   Veronica Hendrix is a 35 y.o. [id number] female at [redacted]w[redacted]d with an Estimated Date of Delivery: 05/06/21 being seen today for ongoing management of a low-risk pregnancy.  Depression screen [facility] 2/9 02/19/2021 11/02/2020 09/13/2020 05/21/2018 04/17/2018  Decreased Interest 2 1 1  0 1  Down, Depressed, Hopeless 1 1 0 0 1  PHQ - 2 Score 3 2 1  0 2  Altered sleeping 2 1 1  - 0  Tired, decreased energy 1 2 1  - 1  Change in appetite 0 1 1 - 1  Feeling bad or failure about yourself  0 2 1 - 0  Trouble concentrating 2 1 0 - 1  Moving slowly or fidgety/restless 0 0 0 - 0  Suicidal thoughts 0 0 0 - 0  PHQ-9 Score 8 9 5  - 5  Difficult doing work/chores - - - - Somewhat difficult  Some encounter information is confidential and restricted. Go to Review Flowsheets activity to see all data.  Some recent data might be hidden    Today she reports feeling tired. On Thurs had 1 time leaking of fluid while holding other child, nothing at all since. . Contractions: Irritability. Vag. Bleeding: None.  Movement: Present. Review of Systems:   Pertinent items are noted in HPI Denies abnormal vaginal discharge w/ itching/odor/irritation, headaches, visual changes, shortness of breath, chest pain, abdominal pain, severe nausea/vomiting, or problems with urination or bowel movements unless otherwise stated above. Pertinent History Reviewed:  Reviewed past medical,surgical, social, obstetrical and family history.  Reviewed problem list, medications and allergies. Physical Assessment:   Vitals:   02/19/21 0900  BP: 135/76  Pulse: (!) 106  Weight: 247 lb 6.4 oz (112.2 kg)  Body mass index is 41.17 kg/m.        Physical Examination:   General appearance: Well appearing, and in no distress  Mental status: Alert, oriented to person, place, and time  Skin:  Warm & dry  Cardiovascular: Normal heart rate noted  Respiratory: Normal respiratory effort, no distress  Abdomen: Soft, gravid, nontender  Pelvic: Cervical exam deferred         Extremities: Edema: Trace  Fetal Status: Fetal Heart Rate (bpm): 132 Fundal Height: 31 cm Movement: Present    Chaperone: N/A   No results found for this or any previous visit (from the past 24 hour(s)).  Assessment & Plan:  1) Low-risk pregnancy G3P1102 at [redacted]w[redacted]d with an Estimated Date of Delivery: 05/06/21   2) H/O A1DM, GTT today  3) H/O severe pre-e> EFW @ 32wks per MFM guidelines, continue ASA  4) LOF x 1 4d ago, none since, reviewed ptl s/s, reasons to seek care   Meds: No orders of the defined types were placed in this encounter.  Labs/procedures today: tdap and PN2  Plan:  Continue routine obstetrical care  Next visit: prefers will be in person for u/s    Reviewed: Preterm labor symptoms and general obstetric precautions including but not limited to vaginal bleeding, contractions, leaking of fluid and fetal movement were reviewed in detail with the patient.  All questions were answered.  Follow-up: Return in about 3 weeks (around 03/12/2021) for LROB, US:EFW, in person, CNM.  No future appointments.  Orders Placed This Encounter  Procedures  . OB Follow Up  . Tdap vaccine greater than or equal to 7yo IM  Cheral Marker CNM, [facility] 02/19/2021 9:21 AM

## 2021-02-20 ENCOUNTER — Other Ambulatory Visit: Payer: Self-pay | Admitting: *Deleted

## 2021-02-20 ENCOUNTER — Encounter: Payer: Self-pay | Admitting: Women's Health

## 2021-02-20 DIAGNOSIS — O0990 Supervision of high risk pregnancy, unspecified, unspecified trimester: Secondary | ICD-10-CM

## 2021-02-20 DIAGNOSIS — O24419 Gestational diabetes mellitus in pregnancy, unspecified control: Secondary | ICD-10-CM | POA: Insufficient documentation

## 2021-02-20 DIAGNOSIS — O24410 Gestational diabetes mellitus in pregnancy, diet controlled: Secondary | ICD-10-CM

## 2021-02-20 LAB — GLUCOSE TOLERANCE, 2 HOURS W/ 1HR
Glucose, 1 hour: 183 mg/dL — ABNORMAL HIGH (ref 65–179)
Glucose, 2 hour: 102 mg/dL (ref 65–152)
Glucose, Fasting: 105 mg/dL — ABNORMAL HIGH (ref 65–91)

## 2021-02-20 LAB — CBC
Hematocrit: 33.7 % — ABNORMAL LOW (ref 34.0–46.6)
Hemoglobin: 11.6 g/dL (ref 11.1–15.9)
MCH: 30.6 pg (ref 26.6–33.0)
MCHC: 34.4 g/dL (ref 31.5–35.7)
MCV: 89 fL (ref 79–97)
Platelets: 232 10*3/uL (ref 150–450)
RBC: 3.79 x10E6/uL (ref 3.77–5.28)
RDW: 12.9 % (ref 11.7–15.4)
WBC: 12.9 10*3/uL — ABNORMAL HIGH (ref 3.4–10.8)

## 2021-02-20 LAB — RPR: RPR Ser Ql: NONREACTIVE

## 2021-02-20 LAB — ANTIBODY SCREEN: Antibody Screen: NEGATIVE

## 2021-02-20 LAB — HIV ANTIBODY (ROUTINE TESTING W REFLEX): HIV Screen 4th Generation wRfx: NONREACTIVE

## 2021-02-20 MED ORDER — ACCU-CHEK SOFTCLIX LANCETS MISC: 12 refills | Status: DC

## 2021-02-20 MED ORDER — ACCU-CHEK GUIDE VI STRP: ORAL_STRIP | 12 refills | Status: DC

## 2021-02-20 MED ORDER — ACCU-CHEK GUIDE ME W/DEVICE KIT: 1.0000 | PACK | Freq: Four times a day (QID) | 0 refills | Status: DC

## 2021-02-27 ENCOUNTER — Encounter: Payer: Medicaid Other | Admitting: Nutrition

## 2021-03-05 ENCOUNTER — Telehealth: Payer: Self-pay | Admitting: *Deleted

## 2021-03-05 MED ORDER — SERTRALINE HCL 25 MG PO TABS: 25.0000 mg | ORAL_TABLET | Freq: Every day | ORAL | 3 refills | Status: DC

## 2021-03-05 NOTE — Telephone Encounter (Signed)
Kim sent med to pharmacy. JSY

## 2021-03-07 ENCOUNTER — Inpatient Hospital Stay (HOSPITAL_BASED_OUTPATIENT_CLINIC_OR_DEPARTMENT_OTHER): Payer: Medicaid Other

## 2021-03-07 ENCOUNTER — Other Ambulatory Visit: Payer: Self-pay

## 2021-03-07 ENCOUNTER — Encounter (HOSPITAL_COMMUNITY): Payer: Self-pay | Admitting: Obstetrics & Gynecology

## 2021-03-07 ENCOUNTER — Inpatient Hospital Stay (HOSPITAL_COMMUNITY)
Admission: AD | Admit: 2021-03-07 | Discharge: 2021-03-07 | Disposition: A | Discharge status: Home / Self Care | Payer: Medicaid Other | Attending: Obstetrics & Gynecology | Admitting: Obstetrics & Gynecology

## 2021-03-07 DIAGNOSIS — O0990 Supervision of high risk pregnancy, unspecified, unspecified trimester: Secondary | ICD-10-CM

## 2021-03-07 DIAGNOSIS — O99513 Diseases of the respiratory system complicating pregnancy, third trimester: Secondary | ICD-10-CM | POA: Diagnosis not present

## 2021-03-07 DIAGNOSIS — Z7982 Long term (current) use of aspirin: Secondary | ICD-10-CM | POA: Diagnosis not present

## 2021-03-07 DIAGNOSIS — R059 Cough, unspecified: Secondary | ICD-10-CM

## 2021-03-07 DIAGNOSIS — Z79899 Other long term (current) drug therapy: Secondary | ICD-10-CM | POA: Diagnosis not present

## 2021-03-07 DIAGNOSIS — O24410 Gestational diabetes mellitus in pregnancy, diet controlled: Secondary | ICD-10-CM

## 2021-03-07 DIAGNOSIS — Z3A31 31 weeks gestation of pregnancy: Secondary | ICD-10-CM | POA: Insufficient documentation

## 2021-03-07 DIAGNOSIS — Z0371 Encounter for suspected problem with amniotic cavity and membrane ruled out: Secondary | ICD-10-CM | POA: Diagnosis not present

## 2021-03-07 DIAGNOSIS — O99891 Other specified diseases and conditions complicating pregnancy: Secondary | ICD-10-CM

## 2021-03-07 DIAGNOSIS — O09293 Supervision of pregnancy with other poor reproductive or obstetric history, third trimester: Secondary | ICD-10-CM | POA: Diagnosis not present

## 2021-03-07 DIAGNOSIS — Z20822 Contact with and (suspected) exposure to covid-19: Secondary | ICD-10-CM | POA: Diagnosis not present

## 2021-03-07 DIAGNOSIS — J302 Other seasonal allergic rhinitis: Secondary | ICD-10-CM | POA: Insufficient documentation

## 2021-03-07 DIAGNOSIS — Z3493 Encounter for supervision of normal pregnancy, unspecified, third trimester: Secondary | ICD-10-CM

## 2021-03-07 DIAGNOSIS — Z8249 Family history of ischemic heart disease and other diseases of the circulatory system: Secondary | ICD-10-CM | POA: Insufficient documentation

## 2021-03-07 DIAGNOSIS — O403XX0 Polyhydramnios, third trimester, not applicable or unspecified: Secondary | ICD-10-CM

## 2021-03-07 DIAGNOSIS — Z3689 Encounter for other specified antenatal screening: Secondary | ICD-10-CM

## 2021-03-07 DIAGNOSIS — R058 Other specified cough: Secondary | ICD-10-CM

## 2021-03-07 LAB — URINALYSIS, ROUTINE W REFLEX MICROSCOPIC
Bilirubin Urine: NEGATIVE
Glucose, UA: NEGATIVE mg/dL
Hgb urine dipstick: NEGATIVE
Ketones, ur: 80 mg/dL — AB
Nitrite: NEGATIVE
Protein, ur: 30 mg/dL — AB
Specific Gravity, Urine: 1.026 (ref 1.005–1.030)
pH: 5 (ref 5.0–8.0)

## 2021-03-07 LAB — RESP PANEL BY RT-PCR (FLU A&B, COVID) ARPGX2
Influenza A by PCR: NEGATIVE
Influenza B by PCR: NEGATIVE
SARS Coronavirus 2 by RT PCR: NEGATIVE

## 2021-03-07 LAB — POCT FERN TEST: POCT Fern Test: NEGATIVE

## 2021-03-07 LAB — AMNISURE RUPTURE OF MEMBRANE (ROM) NOT AT ARMC: Amnisure ROM: NEGATIVE

## 2021-03-07 MED ORDER — LORATADINE 10 MG PO TABS: 10.0000 mg | ORAL_TABLET | Freq: Every day | ORAL | 0 refills | Status: DC | PRN

## 2021-03-07 MED ORDER — ACETAMINOPHEN 500 MG PO TABS
1000.0000 mg | ORAL_TABLET | Freq: Once | ORAL | Status: AC
  Administered 2021-03-07: 1000 mg via ORAL
  Filled 2021-03-07: qty 2

## 2021-03-07 MED ORDER — DIPHENHYDRAMINE-APAP (SLEEP) 25-500 MG PO TABS: 1.0000 | ORAL_TABLET | Freq: Every evening | ORAL | 0 refills | Status: DC | PRN

## 2021-03-07 MED ORDER — GUAIFENESIN 100 MG/5ML PO SOLN
5.0000 mL | Freq: Once | ORAL | Status: AC
  Administered 2021-03-07: 100 mg via ORAL
  Filled 2021-03-07: qty 15

## 2021-03-07 MED ORDER — GUAIFENESIN 100 MG/5ML PO LIQD: 100.0000 mg | ORAL | 0 refills | Status: DC | PRN

## 2021-03-07 NOTE — MAU Provider Note (Signed)
History     CSN: [id number]  Arrival date and time: 03/07/21 3267   Event Date/Time   First Provider Initiated Contact with Patient 03/07/21 1859      Chief Complaint  Patient presents with  . Rupture of Membranes   HPI Veronica Hendrix is a 35 y.o. [id number] at 3w3dwho presents to MAU with chief complaint of leaking of fluid. This is a new problem, onset two days ago. Patient has an allergy-related seasonal cough and reports leaking of fluid each time she coughs. She denies vaginal bleeding, decreased fetal movement, fever, falls, or recent illness.   Patient also complains of low back pain and bilateral lower abdominal pain. These are recurrent problems, onset with third trimester. Patient's pain score is 7/10. She denies aggravating or alleviating factors. She has not taken medication because her finances are very tight and she cannot afford any out of pocket medication costs.  [facility] care [facility].  OB History    Gravida  3   Para  2   Term  1   Preterm  1   AB  0   Living  2     SAB      IAB      Ectopic      Multiple      Live Births  2           Past Medical History:  Diagnosis Date  . 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 Depression   . Headache   . History of pre-eclampsia in prior pregnancy, currently pregnant   . HPV in female   . Panic attacks   . Post traumatic stress disorder (PTSD)   . Vision abnormalities     Past Surgical History:  Procedure Laterality Date  . BLOOD PATCH      Family History  Problem Relation Age of Onset  . Diabetes Mother   . Depression Mother   . Mental illness Father   . Hypertension Father   . Depression Father   . Anxiety disorder Father   . Bipolar disorder Father   . Heart disease Maternal Grandmother   . Heart disease Maternal Grandfather   . Heart disease Paternal Grandfather   . Hydrocephalus Son     Social History   Tobacco Use  . Smoking status: Never Smoker  . Smokeless tobacco:  Never Used  Vaping Use  . Vaping Use: Never used  Substance Use Topics  . Alcohol use: No  . Drug use: No    Allergies: No Known Allergies  Medications Prior to Admission  Medication Sig Dispense Refill Last Dose  . aspirin EC 81 MG tablet Take 2 tablets (162 mg total) by mouth daily. 60 tablet 6 03/06/2021 at Unknown time  . Prenatal Vit-Fe Fumarate-FA (PRENATAL VITAMIN PO) Take 1 tablet by mouth daily.   03/06/2021 at Unknown time  . sertraline (ZOLOFT) 25 MG tablet Take 1 tablet (25 mg total) by mouth daily. 90 tablet 3 03/06/2021 at Unknown time  . Accu-Chek Softclix Lancets lancets Use as instructed to check blood sugar 4 times daily 100 each 12   . acetaminophen (TYLENOL) 500 MG tablet Take 1,000 mg by mouth every 6 (six) hours as needed for moderate pain.      . Blood Glucose Monitoring Suppl (ACCU-CHEK GUIDE ME) w/Device KIT 1 each by Does not apply route 4 (four) times daily. 1 kit 0   . Blood Pressure Monitor MISC For regular home bp monitoring  during pregnancy 1 each 0   . glucose blood (ACCU-CHEK GUIDE) test strip Use as instructed to check blood sugar 4 times daily 50 each 12     Review of Systems  Gastrointestinal: Positive for abdominal pain.  Genitourinary: Positive for vaginal discharge.  Musculoskeletal: Positive for back pain.  All other systems reviewed and are negative.  Physical Exam   Blood pressure 134/78, pulse (!) 121, temperature 99 F (37.2 C), temperature source Oral, resp. rate 17, last menstrual period 07/30/2020, SpO2 100 %.  Physical Exam Vitals and nursing note reviewed. Exam conducted with a chaperone present.  Constitutional:      Appearance: Normal appearance. She is obese. She is not ill-appearing.  Cardiovascular:     Rate and Rhythm: Normal rate.     Pulses: Normal pulses.     Heart sounds: Normal heart sounds.  Pulmonary:     Effort: Pulmonary effort is normal. No accessory muscle usage or respiratory distress.     Breath sounds: Normal  breath sounds. No decreased breath sounds, wheezing, rhonchi or rales.  Abdominal:     Comments: Gravid  Genitourinary:    Comments: Pelvic exam: External genitalia normal, vaginal walls pink and well rugated, cervix visually closed, no lesions noted.   Skin:    Capillary Refill: Capillary refill takes less than 2 seconds.  Neurological:     Mental Status: She is alert and oriented to person, place, and time.  Psychiatric:        Mood and Affect: Mood normal.        Behavior: Behavior normal.        Thought Content: Thought content normal.        Judgment: Judgment normal.    MAU Course  Procedures  --Fern positive on sample collected during sterile speculum exam but no pooling or abnormal discharge visualized on sterile speculum exam. No leaking of fluid with Valsalva or while coughing --Amnisure negative --AFI WNL on formal ultrasound --Reactive tracing: baseline 130, mod var, + accels, no decels --Toco: occasional UI, otherwise quiet --Discussed ROM workup with Dr. Roselie Awkward. Scan ferning on my slide, all other assessments confirm intact amniotic sac. Patient's pain resolved with Tylenol.  Patient Vitals for the past 24 hrs:  BP Temp Temp src Pulse Resp SpO2  03/07/21 1945 -- -- -- -- -- 97 %  03/07/21 1930 127/80 -- -- (!) 120 -- 97 %  03/07/21 1915 138/83 -- -- (!) 110 -- 97 %  03/07/21 1900 134/78 99 F (37.2 C) Oral (!) 121 17 100 %   Results for orders placed or performed during the hospital encounter of 03/07/21 (from the past 24 hour(s))  Urinalysis, Routine w reflex microscopic Urine, Clean Catch     Status: Abnormal   Collection Time: 03/07/21  6:41 PM  Result Value Ref Range   Color, Urine AMBER (A) YELLOW   APPearance CLOUDY (A) CLEAR   Specific Gravity, Urine 1.026 1.005 - 1.030   pH 5.0 5.0 - 8.0   Glucose, UA NEGATIVE NEGATIVE mg/dL   Hgb urine dipstick NEGATIVE NEGATIVE   Bilirubin Urine NEGATIVE NEGATIVE   Ketones, ur 80 (A) NEGATIVE mg/dL   Protein, ur  30 (A) NEGATIVE mg/dL   Nitrite NEGATIVE NEGATIVE   Leukocytes,Ua LARGE (A) NEGATIVE   RBC / HPF 0-5 0 - 5 RBC/hpf   WBC, UA 21-50 0 - 5 WBC/hpf   Bacteria, UA MANY (A) NONE SEEN   Squamous Epithelial / LPF 21-50 0 - 5  Mucus PRESENT   Fern Test     Status: None   Collection Time: 03/07/21  7:06 PM  Result Value Ref Range   POCT Fern Test Negative = intact amniotic membranes   Amnisure rupture of membrane (rom)not at [facility]     Status: None   Collection Time: 03/07/21  7:18 PM  Result Value Ref Range   Amnisure ROM NEGATIVE   Resp Panel by RT-PCR (Flu A&B, Covid) Nasopharyngeal Swab     Status: None   Collection Time: 03/07/21  7:18 PM   Specimen: Nasopharyngeal Swab; Nasopharyngeal(NP) swabs in vial transport medium  Result Value Ref Range   SARS Coronavirus 2 by RT PCR NEGATIVE NEGATIVE   Influenza A by PCR NEGATIVE NEGATIVE   Influenza B by PCR NEGATIVE NEGATIVE   Meds ordered this encounter  Medications  . acetaminophen (TYLENOL) tablet 1,000 mg  . guaiFENesin (ROBITUSSIN) 100 MG/5ML solution 100 mg  . diphenhydramine-acetaminophen (TYLENOL PM) 25-500 MG TABS tablet    Sig: Take 1 tablet by mouth at bedtime as needed.    Dispense:  30 tablet    Refill:  0    Order Specific Question:   Supervising Provider    Answer:   Woodroe Mode [[id number]]  . loratadine (CLARITIN) 10 MG tablet    Sig: Take 1 tablet (10 mg total) by mouth daily as needed for allergies.    Dispense:  30 tablet    Refill:  0    Order Specific Question:   Supervising Provider    Answer:   Janyth Pupa [[id number]]  . guaiFENesin (ROBITUSSIN) 100 MG/5ML liquid    Sig: Take 5-10 mLs (100-200 mg total) by mouth every 4 (four) hours as needed for cough.    Dispense:  60 mL    Refill:  0    Order Specific Question:   Supervising Provider    Answer:   Janyth Pupa [[id number]]    Assessment and Plan  --35 y.o. G3P1102 at [redacted]w[redacted]d --Intact amniotic sac --Reactive tracing, closed cervix --Productive cough in  setting of seasonal allergies --Negative COVID and Flu --Urine culture in work --Discharge home in stable condition  F/U: --Next appointment [facility] is 05/31  SDarlina Rumpf CEd[place]5/18/2022, 9:21 PM

## 2021-03-07 NOTE — Discharge Instructions (Signed)
Safe Medications in Pregnancy   Acne: Benzoyl Peroxide Salicylic Acid  Backache/Headache: Tylenol: 2 regular strength every 4 hours OR              2 Extra strength every 6 hours  Colds/Coughs/Allergies: Benadryl (alcohol free) 25 mg every 6 hours as needed Breath right strips Claritin Cepacol throat lozenges Chloraseptic throat spray Cold-Eeze- up to three times per day Cough drops, alcohol free Flonase (by prescription only) Guaifenesin Mucinex Robitussin DM (plain only, alcohol free) Saline nasal spray/drops Sudafed (pseudoephedrine) & Actifed ** use only after [redacted] weeks gestation and if you do not have high blood pressure Tylenol Vicks Vaporub Zinc lozenges Zyrtec   Constipation: Colace Ducolax suppositories Fleet enema Glycerin suppositories Metamucil Milk of magnesia Miralax Senokot Smooth move tea  Diarrhea: Kaopectate Imodium A-D  *NO pepto Bismol  Hemorrhoids: Anusol Anusol HC Preparation H Tucks  Indigestion: Tums Maalox Mylanta Zantac  Pepcid  Insomnia: Benadryl (alcohol free) 25mg  every 6 hours as needed Tylenol PM Unisom, no Gelcaps  Leg Cramps: Tums MagGel  Nausea/Vomiting:  Bonine Dramamine Emetrol Ginger extract Sea bands Meclizine  Nausea medication to take during pregnancy:  Unisom (doxylamine succinate 25 mg tablets) Take one tablet daily at bedtime. If symptoms are not adequately controlled, the dose can be increased to a maximum recommended dose of two tablets daily (1/2 tablet in the morning, 1/2 tablet mid-afternoon and one at bedtime). Vitamin B6 100mg  tablets. Take one tablet twice a day (up to 200 mg per day).  Skin Rashes: Aveeno products Benadryl cream or 25mg  every 6 hours as needed Calamine Lotion 1% cortisone cream  Yeast infection: Gyne-lotrimin 7 Monistat 7   **If taking multiple medications, please check labels to avoid duplicating the same active ingredients **take medication as directed on  the label ** Do not exceed 4000 mg of tylenol in 24 hours **Do not take medications that contain aspirin or ibuprofen      Cough, Adult Coughing is a reflex that clears your throat and your airways (respiratory system). Coughing helps to heal and protect your lungs. It is normal to cough occasionally, but a cough that happens with other symptoms or lasts a long time may be a sign of a condition that needs treatment. An acute cough may only last 2-3 weeks, while a chronic cough may last 8 or more weeks. Coughing is commonly caused by:  Infection of the respiratory systemby viruses or bacteria.  Breathing in substances that irritate your lungs.  Allergies.  Asthma.  Mucus that runs down the back of your throat (postnasal drip).  Smoking.  Acid backing up from the stomach into the esophagus (gastroesophageal reflux).  Certain medicines.  Chronic lung problems.  Other medical conditions such as heart failure or a blood clot in the lung (pulmonary embolism). Follow these instructions at home: Medicines  Take over-the-counter and prescription medicines only as told by your health care provider.  Talk with your health care provider before you take a cough suppressant medicine. Lifestyle  Avoid cigarette smoke. Do not use any products that contain nicotine or tobacco, such as cigarettes, e-cigarettes, and chewing tobacco. If you need help quitting, ask your health care provider.  Drink enough fluid to keep your urine pale yellow.  Avoid caffeine.  Do not drink alcohol if your health care provider tells you not to drink.   General instructions  Pay close attention to changes in your cough. Tell your health care provider about them.  Always cover your mouth when you  cough.  Avoid things that make you cough, such as perfume, candles, cleaning products, or campfire or tobacco smoke.  If the air is dry, use a cool mist vaporizer or humidifier in your bedroom or your home to  help loosen secretions.  If your cough is worse at night, try to sleep in a semi-upright position.  Rest as needed.  Keep all follow-up visits as told by your health care provider. This is important.   Contact a health care provider if you:  Have new symptoms.  Cough up pus.  Have a cough that does not get better after 2-3 weeks or gets worse.  Cannot control your cough with cough suppressant medicines and you are losing sleep.  Have pain that gets worse or pain that is not helped with medicine.  Have a fever.  Have unexplained weight loss.  Have night sweats. Get help right away if:  You cough up blood.  You have difficulty breathing.  Your heartbeat is very fast. These symptoms may represent a serious problem that is an emergency. Do not wait to see if the symptoms will go away. Get medical help right away. Call your local emergency services (911 in the [place] Do not drive yourself to the hospital. Summary  Coughing is a reflex that clears your throat and your airways. It is normal to cough occasionally, but a cough that happens with other symptoms or lasts a long time may be a sign of a condition that needs treatment.  Take over-the-counter and prescription medicines only as told by your health care provider.  Always cover your mouth when you cough.  Contact a health care provider if you have new symptoms or a cough that does not get better after 2-3 weeks or gets worse. This information is not intended to replace advice given to you by your health care provider. Make sure you discuss any questions you have with your health care provider. Document Revised: 10/26/2018 Document Reviewed: 10/26/2018 Elsevier Patient Education  2021 [employer].

## 2021-03-07 NOTE — MAU Note (Signed)
...  Veronica Hendrix is a 35 y.o. at [redacted]w[redacted]d here in MAU reporting: Gush of fluids every time she coughs since Monday, 03/05/2021. Patient states she did not feel well on Monday and began coughing. She reports her allergies are always bad this time of year. She states she would use the restroom to empty her bladder and barely any urine would come out. She states she would later on cough, and a large gush of clear fluids would come out. +FM. No CTX or abdominal tightness. No pain.   FHT: 140 initial  Lab orders placed from triage:  [facility] and [employer]

## 2021-03-09 LAB — CULTURE, OB URINE

## 2021-03-12 ENCOUNTER — Other Ambulatory Visit: Payer: Self-pay | Admitting: Women's Health

## 2021-03-12 ENCOUNTER — Other Ambulatory Visit: Payer: Self-pay

## 2021-03-12 ENCOUNTER — Other Ambulatory Visit (HOSPITAL_COMMUNITY)
Admission: RE | Admit: 2021-03-12 | Discharge: 2021-03-12 | Disposition: A | Discharge status: Home / Self Care | Payer: Medicaid Other | Source: Ambulatory Visit | Attending: Obstetrics & Gynecology | Admitting: Obstetrics & Gynecology

## 2021-03-12 ENCOUNTER — Encounter: Payer: Self-pay | Admitting: Women's Health

## 2021-03-12 ENCOUNTER — Ambulatory Visit (INDEPENDENT_AMBULATORY_CARE_PROVIDER_SITE_OTHER): Payer: Medicaid Other | Admitting: Women's Health

## 2021-03-12 VITALS — BP 124/89 | HR 96 | Wt 243.0 lb

## 2021-03-12 DIAGNOSIS — O0993 Supervision of high risk pregnancy, unspecified, third trimester: Secondary | ICD-10-CM

## 2021-03-12 DIAGNOSIS — O26893 Other specified pregnancy related conditions, third trimester: Secondary | ICD-10-CM | POA: Diagnosis present

## 2021-03-12 DIAGNOSIS — O0990 Supervision of high risk pregnancy, unspecified, unspecified trimester: Secondary | ICD-10-CM

## 2021-03-12 DIAGNOSIS — O24410 Gestational diabetes mellitus in pregnancy, diet controlled: Secondary | ICD-10-CM

## 2021-03-12 DIAGNOSIS — N898 Other specified noninflammatory disorders of vagina: Secondary | ICD-10-CM

## 2021-03-12 DIAGNOSIS — Z3A32 32 weeks gestation of pregnancy: Secondary | ICD-10-CM

## 2021-03-12 LAB — POCT URINALYSIS DIPSTICK OB
Blood, UA: NEGATIVE
Glucose, UA: NEGATIVE
Ketones, UA: NEGATIVE
Nitrite, UA: NEGATIVE

## 2021-03-12 MED ORDER — METFORMIN HCL 500 MG PO TABS: 500.0000 mg | ORAL_TABLET | Freq: Two times a day (BID) | ORAL | 3 refills | Status: DC

## 2021-03-12 NOTE — Patient Instructions (Signed)
Veronica Hendrix, I greatly value your feedback.  If you receive a survey following your visit with [place] today, we appreciate you taking the time to fill it out.  Thanks, [name], CNM, WHNP-BC  [facility] ([address] [place], [place] [address]) Entrance C, located off of [address] valet parking   Go to [email or web address] to register for FREE online childbirth classes    Call the office [phone]) or go to [facility] if:  You begin to have strong, frequent contractions  Your water breaks.  Sometimes it is a big gush of fluid, sometimes it is just a trickle that keeps getting your panties wet or running down your legs  You have vaginal bleeding.  It is normal to have a small amount of spotting if your cervix was checked.   You don't feel your baby moving like normal.  If you don't, get you something to eat and drink and lay down and focus on feeling your baby move.  You should feel at least 10 movements in 2 hours.  If you don't, you should call the office or go to [facility].   Call the office [phone]) or go to [facility] hospital for these signs of pre-eclampsia:  Severe headache that does not go away with Tylenol  Visual changes- seeing spots, double, blurred vision  Pain under your right breast or upper abdomen that does not go away with Tums or heartburn medicine  Nausea and/or vomiting  Severe swelling in your hands, feet, and face    Home Blood Pressure Monitoring for Patients   Your provider has recommended that you check your blood pressure (BP) at least once a week at home. If you do not have a blood pressure cuff at home, one will be provided for you. Contact your provider if you have not received your monitor within 1 week.   Helpful Tips for Accurate Home Blood Pressure Checks  . Don't smoke, exercise, or drink caffeine 30 minutes before checking your BP . Use the restroom before checking your BP (a full bladder  can raise your pressure) . Relax in a comfortable upright chair . Feet on the ground . Left arm resting comfortably on a flat surface at the level of your heart . Legs uncrossed . Back supported . Sit quietly and don't talk . Place the cuff on your bare arm . Adjust snuggly, so that only two fingertips can fit between your skin and the top of the cuff . Check 2 readings separated by at least one minute . Keep a log of your BP readings . For a visual, please reference this diagram: http://ccnc.care/bpdiagram  Provider Name: [facility]     Phone: [phone]  Zone 1: ALL CLEAR  Continue to monitor your symptoms:  . BP reading is less than 140 (top number) or less than 90 (bottom number)  . No right upper stomach pain . No headaches or seeing spots . No feeling nauseated or throwing up . No swelling in face and hands  Zone 2: CAUTION Call your doctor's office for any of the following:  . BP reading is greater than 140 (top number) or greater than 90 (bottom number)  . Stomach pain under your ribs in the middle or right side . Headaches or seeing spots . Feeling nauseated or throwing up . Swelling in face and hands  Zone 3: EMERGENCY  Seek immediate medical care if you have any of  the following:  . BP reading is greater than160 (top number) or greater than 110 (bottom number) . Severe headaches not improving with Tylenol . Serious difficulty catching your breath . Any worsening symptoms from Zone 2  Preterm Labor and Birth Information  The normal length of a pregnancy is 39-41 weeks. Preterm labor is when labor starts before 37 completed weeks of pregnancy. What are the risk factors for preterm labor? Preterm labor is more likely to occur in women who:  Have certain infections during pregnancy such as a bladder infection, sexually transmitted infection, or infection inside the uterus (chorioamnionitis).  Have a shorter-than-normal cervix.  Have gone into preterm  labor before.  Have had surgery on their cervix.  Are younger than age 24 or older than age 36.  Are African American.  Are pregnant with twins or multiple babies (multiple gestation).  Take street drugs or smoke while pregnant.  Do not gain enough weight while pregnant.  Became pregnant shortly after having been pregnant. What are the symptoms of preterm labor? Symptoms of preterm labor include:  Cramps similar to those that can happen during a menstrual period. The cramps may happen with diarrhea.  Pain in the abdomen or lower back.  Regular uterine contractions that may feel like tightening of the abdomen.  A feeling of increased pressure in the pelvis.  Increased watery or bloody mucus discharge from the vagina.  Water breaking (ruptured amniotic sac). Why is it important to recognize signs of preterm labor? It is important to recognize signs of preterm labor because babies who are born prematurely may not be fully developed. This can put them at an increased risk for:  Long-term (chronic) heart and lung problems.  Difficulty immediately after birth with regulating body systems, including blood sugar, body temperature, heart rate, and breathing rate.  Bleeding in the brain.  Cerebral palsy.  Learning difficulties.  Death. These risks are highest for babies who are born before 43 weeks of pregnancy. How is preterm labor treated? Treatment depends on the length of your pregnancy, your condition, and the health of your baby. It may involve: 1. Having a stitch (suture) placed in your cervix to prevent your cervix from opening too early (cerclage). 2. Taking or being given medicines, such as: ? Hormone medicines. These may be given early in pregnancy to help support the pregnancy. ? Medicine to stop contractions. ? Medicines to help mature the baby's lungs. These may be prescribed if the risk of delivery is high. ? Medicines to prevent your baby from developing  cerebral palsy. If the labor happens before 34 weeks of pregnancy, you may need to stay in the hospital. What should I do if I think I am in preterm labor? If you think that you are going into preterm labor, call your health care provider right away. How can I prevent preterm labor in future pregnancies? To increase your chance of having a full-term pregnancy:  Do not use any tobacco products, such as cigarettes, chewing tobacco, and e-cigarettes. If you need help quitting, ask your health care provider.  Do not use street drugs or medicines that have not been prescribed to you during your pregnancy.  Talk with your health care provider before taking any herbal supplements, even if you have been taking them regularly.  Make sure you gain a healthy amount of weight during your pregnancy.  Watch for infection. If you think that you might have an infection, get it checked right away.  Make sure to  tell your health care provider if you have gone into preterm labor before. This information is not intended to replace advice given to you by your health care provider. Make sure you discuss any questions you have with your health care provider. Document Revised: 01/29/2019 Document Reviewed: 02/28/2016 Elsevier Patient Education  [employer].

## 2021-03-12 NOTE — Progress Notes (Addendum)
Work-in HIGH-RISK PREGNANCY VISIT Patient name: Veronica Hendrix MRN [id number]  Date of birth: [birth year] Chief Complaint:   work in ob (Leaking fluid) and High Risk Gestation  History of Present Illness:   Veronica Hendrix is a 35 y.o. G14P1102 female at [redacted]w[redacted]d with an Estimated Date of Delivery: 05/06/21 being seen today for ongoing management of a high-risk pregnancy complicated by diabetes mellitus A1DM.  Today she reports heavy leaking of fluid since last Monday, went to MAU and fern was positive (note says scant pos), everything else was neg (amnisure & u/s). Went through 6 pair of underwear yesterday. Sometimes can tell it's urine, sometimes does not feel like urine. Depression screen [facility] 2/9 02/19/2021 11/02/2020 09/13/2020 05/21/2018 04/17/2018  Decreased Interest 2 1 1  0 1  Down, Depressed, Hopeless 1 1 0 0 1  PHQ - 2 Score 3 2 1  0 2  Altered sleeping 2 1 1  - 0  Tired, decreased energy 1 2 1  - 1  Change in appetite 0 1 1 - 1  Feeling bad or failure about yourself  0 2 1 - 0  Trouble concentrating 2 1 0 - 1  Moving slowly or fidgety/restless 0 0 0 - 0  Suicidal thoughts 0 0 0 - 0  PHQ-9 Score 8 9 5  - 5  Difficult doing work/chores - - - - Somewhat difficult  Some encounter information is confidential and restricted. Go to Review Flowsheets activity to see all data.  Some recent data might be hidden    Contractions: Not present. Vag. Bleeding: None.  Movement: Present. reports leaking of fluid.  Review of Systems:   Pertinent items are noted in HPI Denies abnormal vaginal discharge w/ itching/odor/irritation, headaches, visual changes, shortness of breath, chest pain, abdominal pain, severe nausea/vomiting, or problems with urination or bowel movements unless otherwise stated above. Pertinent History Reviewed:  Reviewed past medical,surgical, social, obstetrical and family history.  Reviewed problem list, medications and allergies. Physical Assessment:   Vitals:   03/12/21 1131  BP:  124/89  Pulse: 96  Weight: 243 lb (110.2 kg)  Body mass index is 40.44 kg/m.           Physical Examination:   General appearance: alert, well appearing, and in no distress  Mental status: alert, oriented to person, place, and time  Skin: warm & dry   Extremities: Edema: None    Cardiovascular: normal heart rate noted  Respiratory: normal respiratory effort, no distress  Abdomen: gravid, soft, non-tender  Pelvic: SSE: cx visually long & closed, no pooling, no change w/ valsalva x 3. Fern, nitrazine neg        Informal TA u/s: fluid subjectively normal, SDP 6.16cm  Fetal Status: Fetal Heart Rate (bpm): 140 Fundal Height: 32 cm Movement: Present    Fetal Surveillance Testing today: doppler   Chaperone:    Results for orders placed or performed in visit on 03/12/21 (from the past 24 hour(s))  POC Urinalysis Dipstick OB   Collection Time: 03/12/21 11:49 AM  Result Value Ref Range   Color, UA     Clarity, UA     Glucose, UA Negative Negative   Bilirubin, UA     Ketones, UA neg    Spec Grav, UA     Blood, UA neg    pH, UA     POC,PROTEIN,UA Trace Negative, Trace, Small (1+), Moderate (2+), Large (3+), 4+   Urobilinogen, UA     Nitrite, UA neg  Leukocytes, UA Moderate (2+) (A) Negative   Appearance     Odor      Assessment & Plan:  High-risk pregnancy: [id number] at [redacted]w[redacted]d with an Estimated Date of Delivery: 05/06/21   1) Leaking fluid, fern/nitrazine/pooling neg, subjectively normal amniotic fluid, SDP 6.16cm. CV swab sent  2) A1DM, will send me pic via mychart of sugars  Meds: No orders of the defined types were placed in this encounter.   Labs/procedures today: spec exam  Reviewed: Preterm labor symptoms and general obstetric precautions including but not limited to vaginal bleeding, contractions, leaking of fluid and fetal movement were reviewed in detail with the patient.  All questions were answered. Follow-up: Return for As scheduled.   Future  Appointments  Date Time Provider Department Center  03/20/2021 10:00 AM [facility] [place] [facility] None  03/20/2021 10:30 AM Cheral Marker, CNM [facility]    Orders Placed This Encounter  Procedures  . POC Urinalysis Dipstick OB   Cheral Marker CNM, [facility] 03/12/2021 12:21 PM   1335: received sugars via mychart, not checking QID: FBS 16, 137, 133; 2hr pp 99-159 (6 of 9 >120). Rx metformin 500mg  BID, start 2x/wk NST.  , CNM, [facility] 03/12/2021 1:38 PM

## 2021-03-13 LAB — CERVICOVAGINAL ANCILLARY ONLY
Bacterial Vaginitis (gardnerella): NEGATIVE
Candida Glabrata: NEGATIVE
Candida Vaginitis: POSITIVE — AB
Chlamydia: NEGATIVE
Comment: NEGATIVE
Comment: NEGATIVE
Comment: NEGATIVE
Comment: NEGATIVE
Comment: NEGATIVE
Comment: NORMAL
Neisseria Gonorrhea: NEGATIVE
Trichomonas: NEGATIVE

## 2021-03-15 ENCOUNTER — Ambulatory Visit (INDEPENDENT_AMBULATORY_CARE_PROVIDER_SITE_OTHER): Payer: Medicaid Other | Admitting: *Deleted

## 2021-03-15 ENCOUNTER — Other Ambulatory Visit: Payer: Self-pay

## 2021-03-15 VITALS — BP 125/81 | HR 101 | Wt 246.0 lb

## 2021-03-15 DIAGNOSIS — O24419 Gestational diabetes mellitus in pregnancy, unspecified control: Secondary | ICD-10-CM

## 2021-03-15 DIAGNOSIS — O0990 Supervision of high risk pregnancy, unspecified, unspecified trimester: Secondary | ICD-10-CM | POA: Diagnosis not present

## 2021-03-15 DIAGNOSIS — O288 Other abnormal findings on antenatal screening of mother: Secondary | ICD-10-CM

## 2021-03-15 DIAGNOSIS — Z331 Pregnant state, incidental: Secondary | ICD-10-CM

## 2021-03-15 DIAGNOSIS — Z1389 Encounter for screening for other disorder: Secondary | ICD-10-CM

## 2021-03-15 LAB — POCT URINALYSIS DIPSTICK OB
Blood, UA: NEGATIVE
Glucose, UA: NEGATIVE
Ketones, UA: NEGATIVE
Leukocytes, UA: NEGATIVE
Nitrite, UA: NEGATIVE

## 2021-03-15 NOTE — Progress Notes (Addendum)
   NURSE VISIT- NST  SUBJECTIVE:  Veronica Hendrix is a 35 y.o. G3P1102 female at [redacted]w[redacted]d, here for a NST for pregnancy complicated by A2DM currently on Metformin.  She reports active fetal movement, contractions: none, vaginal bleeding: none, membranes: intact.   OBJECTIVE:  BP 125/81   Pulse (!) 101   Wt 246 lb (111.6 kg)   LMP 07/30/2020 (Exact Date)   BMI 40.94 kg/m   Appears well, no apparent distress  Results for orders placed or performed in visit on 03/15/21 (from the past 24 hour(s))  POC Urinalysis Dipstick OB   Collection Time: 03/15/21  4:46 PM  Result Value Ref Range   Color, UA     Clarity, UA     Glucose, UA Negative Negative   Bilirubin, UA     Ketones, UA neg    Spec Grav, UA     Blood, UA neg    pH, UA     POC,PROTEIN,UA Trace Negative, Trace, Small (1+), Moderate (2+), Large (3+), 4+   Urobilinogen, UA     Nitrite, UA neg    Leukocytes, UA Negative Negative   Appearance     Odor      NST: FHR baseline 130 bpm, Variability: moderate, Accelerations:present, Decelerations:  Absent= Cat 1/reactive Toco: none   ASSESSMENT: [id number] at [redacted]w[redacted]d with A2DM currently on Metformin NST reactive  PLAN: EFM strip reviewed by [name], CNM, [facility]   Recommendations: keep next appointment as scheduled    Veronica Hendrix  03/15/2021 5:03 PM   Chart reviewed for nurse visit. Agree with plan of care.  Veronica Hendrix, [place] 03/15/2021 5:08 PM

## 2021-03-16 ENCOUNTER — Other Ambulatory Visit: Payer: Self-pay | Admitting: Women's Health

## 2021-03-16 DIAGNOSIS — Z8759 Personal history of other complications of pregnancy, childbirth and the puerperium: Secondary | ICD-10-CM

## 2021-03-16 DIAGNOSIS — O24419 Gestational diabetes mellitus in pregnancy, unspecified control: Secondary | ICD-10-CM

## 2021-03-16 DIAGNOSIS — Z3483 Encounter for supervision of other normal pregnancy, third trimester: Secondary | ICD-10-CM

## 2021-03-20 ENCOUNTER — Encounter: Payer: Self-pay | Admitting: Women's Health

## 2021-03-20 ENCOUNTER — Other Ambulatory Visit: Payer: Self-pay

## 2021-03-20 ENCOUNTER — Ambulatory Visit (INDEPENDENT_AMBULATORY_CARE_PROVIDER_SITE_OTHER): Payer: Medicaid Other

## 2021-03-20 ENCOUNTER — Ambulatory Visit (INDEPENDENT_AMBULATORY_CARE_PROVIDER_SITE_OTHER): Payer: Medicaid Other | Admitting: Women's Health

## 2021-03-20 VITALS — BP 117/77 | HR 102 | Wt 244.0 lb

## 2021-03-20 DIAGNOSIS — Z3A33 33 weeks gestation of pregnancy: Secondary | ICD-10-CM | POA: Diagnosis not present

## 2021-03-20 DIAGNOSIS — Z3483 Encounter for supervision of other normal pregnancy, third trimester: Secondary | ICD-10-CM

## 2021-03-20 DIAGNOSIS — O24419 Gestational diabetes mellitus in pregnancy, unspecified control: Secondary | ICD-10-CM

## 2021-03-20 DIAGNOSIS — O409XX0 Polyhydramnios, unspecified trimester, not applicable or unspecified: Secondary | ICD-10-CM | POA: Insufficient documentation

## 2021-03-20 DIAGNOSIS — Z8759 Personal history of other complications of pregnancy, childbirth and the puerperium: Secondary | ICD-10-CM | POA: Diagnosis not present

## 2021-03-20 DIAGNOSIS — O0990 Supervision of high risk pregnancy, unspecified, unspecified trimester: Secondary | ICD-10-CM

## 2021-03-20 DIAGNOSIS — O0993 Supervision of high risk pregnancy, unspecified, third trimester: Secondary | ICD-10-CM

## 2021-03-20 DIAGNOSIS — O403XX0 Polyhydramnios, third trimester, not applicable or unspecified: Secondary | ICD-10-CM

## 2021-03-20 DIAGNOSIS — O09299 Supervision of pregnancy with other poor reproductive or obstetric history, unspecified trimester: Secondary | ICD-10-CM

## 2021-03-20 LAB — POCT URINALYSIS DIPSTICK OB
Blood, UA: NEGATIVE
Glucose, UA: NEGATIVE
Ketones, UA: NEGATIVE
Nitrite, UA: NEGATIVE

## 2021-03-20 NOTE — Patient Instructions (Signed)
Lalania, thank you for choosing our office today! We appreciate the opportunity to meet your healthcare needs. You may receive a short survey by mail, e-mail, or through [employer]. If you are happy with your care we would appreciate if you could take just a few minutes to complete the survey questions. We read all of your comments and take your feedback very seriously. Thank you again for choosing our office.  Center for [employer] Team at [facility]  [facility] at [facility] ([address] [place], [place] [address]) Entrance C, located off of [address] Free 24/7 valet parking   CLASSES: Go to [email or web address] to register for classes (childbirth, breastfeeding, waterbirth, infant CPR, daddy bootcamp, etc.)  Call the office [phone]) or go to [facility] if:  You begin to have strong, frequent contractions  Your water breaks.  Sometimes it is a big gush of fluid, sometimes it is just a trickle that keeps getting your panties wet or running down your legs  You have vaginal bleeding.  It is normal to have a small amount of spotting if your cervix was checked.   You don't feel your baby moving like normal.  If you don't, get you something to eat and drink and lay down and focus on feeling your baby move.   If your baby is still not moving like normal, you should call the office or go to [facility].  Call the office [phone]) or go to [facility] hospital for these signs of pre-eclampsia:  Severe headache that does not go away with Tylenol  Visual changes- seeing spots, double, blurred vision  Pain under your right breast or upper abdomen that does not go away with Tums or heartburn medicine  Nausea and/or vomiting  Severe swelling in your hands, feet, and face   Tdap Vaccine  It is recommended that you get the Tdap vaccine during the third trimester of EACH pregnancy to help protect your baby from getting pertussis (whooping cough)  27-36 weeks is the  BEST time to do this so that you can pass the protection on to your baby. During pregnancy is better than after pregnancy, but if you are unable to get it during pregnancy it will be offered at the hospital.   You can get this vaccine with [place], at the health department, your family doctor, or some local pharmacies  Everyone who will be around your baby should also be up-to-date on their vaccines before the baby comes. Adults (who are not pregnant) only need 1 dose of Tdap during adulthood.   [facility] Pediatricians/Family Doctors  [facility]): [address] [place]lette Ribas, [phone]            [facility]: [address], [phone]                 [facility]): [address], [phone] (call to ask if accepting patients)  [facility] Department: [address], [place], [phone]    [facility] Pediatricians/Family Doctors  [facility]): [phone], [phone]  [facility]: [address] [place], [phone]  [facility]: [address], [phone]  [facility] Doctors   [facility]): [phone]  [facility]: [address] [phone]   [facility] Doctors  [facility]: [address] [phone]  [facility] Doctors  . Manson Passey  [facility]: [address], [phone]  Home Blood Pressure Monitoring for Patients   Your provider has recommended that you check your blood pressure (BP) at least once a week at home. If you do not have a blood pressure cuff at home, one will be provided for you. Contact your provider if you have not received your monitor within 1 week.   Helpful Tips for Accurate Home Blood Pressure Checks  . Don't smoke, exercise, or drink caffeine 30 minutes before checking your BP . Use the restroom before  checking your BP (a full bladder can raise your pressure) . Relax in a comfortable upright chair . Feet on the ground . Left arm resting comfortably on a flat surface at the level of your heart . Legs uncrossed . Back supported . Sit quietly and don't talk . Place the cuff on your bare arm . Adjust snuggly, so that only two fingertips can fit between your skin and the top of the cuff . Check 2 readings separated by at least one minute . Keep a log of your BP readings . For a visual, please reference this diagram: http://ccnc.care/bpdiagram  Provider Name: [facility]     Phone: [phone]  Zone 1: ALL CLEAR  Continue to monitor your symptoms:  . BP reading is less than 140 (top number) or less than 90 (bottom number)  . No right upper stomach pain . No headaches or seeing spots . No feeling nauseated or throwing up . No swelling in face and hands  Zone 2: CAUTION Call your doctor's office for any of the following:  . BP reading is greater than 140 (top number) or greater than 90 (bottom number)  . Stomach pain under your ribs in the middle or right side . Headaches or seeing spots . Feeling nauseated or throwing up . Swelling in face and hands  Zone 3: EMERGENCY  Seek immediate medical care if you have any of the following:  . BP reading is greater than160 (top number) or greater than 110 (bottom number) . Severe headaches not improving with Tylenol . Serious difficulty catching your breath . Any worsening symptoms from Zone 2  Preterm Labor and Birth Information  The normal length of a pregnancy is 39-41 weeks. Preterm labor is when labor starts before 37 completed weeks of pregnancy. What are the risk factors for preterm labor? Preterm labor is more likely to occur in women who:  Have certain infections during pregnancy such as a bladder infection, sexually transmitted infection, or infection inside the uterus (chorioamnionitis).  Have a shorter-than-normal  cervix.  Have gone into preterm labor before.  Have had surgery on their cervix.  Are younger than age 61 or older than age 74.  Are African American.  Are pregnant with twins or multiple babies (multiple gestation).  Take street drugs or smoke while pregnant.  Do not gain enough weight while pregnant.  Became pregnant shortly after having been pregnant. What are the symptoms of preterm labor? Symptoms of preterm labor include:  Cramps similar to those that can happen during a menstrual period. The cramps may happen with diarrhea.  Pain in the abdomen or lower back.  Regular uterine contractions that may feel like tightening of the abdomen.  A feeling of increased pressure in the pelvis.  Increased watery or bloody mucus discharge from the vagina.  Water breaking (ruptured amniotic sac). Why is it important to recognize signs of preterm labor? It is important to recognize signs of  preterm labor because babies who are born prematurely may not be fully developed. This can put them at an increased risk for:  Long-term (chronic) heart and lung problems.  Difficulty immediately after birth with regulating body systems, including blood sugar, body temperature, heart rate, and breathing rate.  Bleeding in the brain.  Cerebral palsy.  Learning difficulties.  Death. These risks are highest for babies who are born before 34 weeks of pregnancy. How is preterm labor treated? Treatment depends on the length of your pregnancy, your condition, and the health of your baby. It may involve: 1. Having a stitch (suture) placed in your cervix to prevent your cervix from opening too early (cerclage). 2. Taking or being given medicines, such as: ? Hormone medicines. These may be given early in pregnancy to help support the pregnancy. ? Medicine to stop contractions. ? Medicines to help mature the baby's lungs. These may be prescribed if the risk of delivery is high. ? Medicines to  prevent your baby from developing cerebral palsy. If the labor happens before 34 weeks of pregnancy, you may need to stay in the hospital. What should I do if I think I am in preterm labor? If you think that you are going into preterm labor, call your health care provider right away. How can I prevent preterm labor in future pregnancies? To increase your chance of having a full-term pregnancy:  Do not use any tobacco products, such as cigarettes, chewing tobacco, and e-cigarettes. If you need help quitting, ask your health care provider.  Do not use street drugs or medicines that have not been prescribed to you during your pregnancy.  Talk with your health care provider before taking any herbal supplements, even if you have been taking them regularly.  Make sure you gain a healthy amount of weight during your pregnancy.  Watch for infection. If you think that you might have an infection, get it checked right away.  Make sure to tell your health care provider if you have gone into preterm labor before. This information is not intended to replace advice given to you by your health care provider. Make sure you discuss any questions you have with your health care provider. Document Revised: 01/29/2019 Document Reviewed: 02/28/2016 Elsevier Patient Education  2020 [employer].

## 2021-03-20 NOTE — Progress Notes (Addendum)
[place] 33+2 wks,complete breech,anterior placenta gr 3,BPP 8/8,FHR 127 BPM,AFI 23 cm,cx 3.9 cm

## 2021-03-20 NOTE — Progress Notes (Signed)
HIGH-RISK PREGNANCY VISIT Patient name: Veronica Hendrix MRN [id number]  Date of birth: 1985-12-16 Chief Complaint:   Routine Prenatal Visit  History of Present Illness:   Veronica Hendrix is a 35 y.o. G58P1102 female at [redacted]w[redacted]d with an Estimated Date of Delivery: 05/06/21 being seen today for ongoing management of a high-risk pregnancy complicated by diabetes mellitus A2DM currently on metformin 500mg  BID .    Today she reports no complaints and FBS 99-114 (all >95), 2hr pp 90-142 (only 3 >120). Contractions: Not present. Vag. Bleeding: None.  Movement: Present. denies leaking of fluid.   Depression screen [facility] 2/9 02/19/2021 11/02/2020 09/13/2020 05/21/2018 04/17/2018  Decreased Interest 2 1 1  0 1  Down, Depressed, Hopeless 1 1 0 0 1  PHQ - 2 Score 3 2 1  0 2  Altered sleeping 2 1 1  - 0  Tired, decreased energy 1 2 1  - 1  Change in appetite 0 1 1 - 1  Feeling bad or failure about yourself  0 2 1 - 0  Trouble concentrating 2 1 0 - 1  Moving slowly or fidgety/restless 0 0 0 - 0  Suicidal thoughts 0 0 0 - 0  PHQ-9 Score 8 9 5  - 5  Difficult doing work/chores - - - - Somewhat difficult  Some encounter information is confidential and restricted. Go to Review Flowsheets activity to see all data.  Some recent data might be hidden     GAD 7 : Generalized Anxiety Score 02/19/2021 11/02/2020 09/13/2020 11/07/2017  Nervous, Anxious, on Edge 2 3 1 3   Control/stop worrying 2 3 1 3   Worry too much - different things 2 3 1 3   Trouble relaxing 1 2 1 3   Restless 1 2 1 3   Easily annoyed or irritable 2 3 1 3   Afraid - awful might happen 1 1 1 3   Total GAD 7 Score 11 17 7 21   Anxiety Difficulty - - - Very difficult  Some encounter information is confidential and restricted. Go to Review Flowsheets activity to see all data.     Review of Systems:   Pertinent items are noted in HPI Denies abnormal vaginal discharge w/ itching/odor/irritation, headaches, visual changes, shortness of breath, chest pain, abdominal  pain, severe nausea/vomiting, or problems with urination or bowel movements unless otherwise stated above. Pertinent History Reviewed:  Reviewed past medical,surgical, social, obstetrical and family history.  Reviewed problem list, medications and allergies. Physical Assessment:   Vitals:   03/20/21 1039  BP: 117/77  Pulse: (!) 102  Weight: 244 lb (110.7 kg)  Body mass index is 40.6 kg/m.           Physical Examination:   General appearance: alert, well appearing, and in no distress  Mental status: alert, oriented to person, place, and time  Skin: warm & dry   Extremities: Edema: None    Cardiovascular: normal heart rate noted  Respiratory: normal respiratory effort, no distress  Abdomen: gravid, soft, non-tender  Pelvic: Cervical exam deferred         Fetal Status: Fetal Heart Rate (bpm): 127 u/s   Movement: Present    Fetal Surveillance Testing today:  33+2 wks,complete breech,anterior placenta gr 3,BPP 8/8,FHR 127 BPM,AFI 23 cm,cx 3.9 cm  Chaperone: N/A    Results for orders placed or performed in visit on 03/20/21 (from the past 24 hour(s))  POC Urinalysis Dipstick OB   Collection Time: 03/20/21 10:47 AM  Result Value Ref Range   Color, UA  Clarity, UA     Glucose, UA Negative Negative   Bilirubin, UA     Ketones, UA neg    Spec Grav, UA     Blood, UA neg    pH, UA     POC,PROTEIN,UA Trace Negative, Trace, Small (1+), Moderate (2+), Large (3+), 4+   Urobilinogen, UA     Nitrite, UA neg    Leukocytes, UA Large (3+) (A) Negative   Appearance     Odor      Assessment & Plan:  High-risk pregnancy: [id number] at [redacted]w[redacted]d with an Estimated Date of Delivery: 05/06/21   1) A2DM, unstable> increase PM metformin to 1,000mg  and add bedtime protein/carb snack; continue metformin 500mg  AM. EFW on 5/18 78% (31wks)  2) H/O severe pre-e, stable, continue ASA, reviewed pre-e s/s, reasons to seek care  3) Recently polyhydramnios> 27.7cm on 5/18 in MAU, now 23cm  today  Meds: No orders of the defined types were placed in this encounter.   Labs/procedures today: U/S  Treatment Plan:   Growth u/s q4wks    2x/wk NST   Deliver @ 39-39.6wks:______   Reviewed: Preterm labor symptoms and general obstetric precautions including but not limited to vaginal bleeding, contractions, leaking of fluid and fetal movement were reviewed in detail with the patient.  All questions were answered. Does have home bp cuff. Office bp cuff given: not applicable. Check bp daily, let 6/18 know if consistently >140 and/or >90.  Follow-up: Return for fri nst/nurse; tues nst/hrob w/ md/cnm until 39wks (with bpp/efw @ 36wk in place of nst).   Future Appointments  Date Time Provider Department Center  03/23/2021 12:30 PM [facility] NURSE [facility]    Orders Placed This Encounter  Procedures  . POC Urinalysis Dipstick OB   05/23/2021 CNM, [facility] 03/20/2021 11:12 AM

## 2021-03-23 ENCOUNTER — Other Ambulatory Visit: Payer: Self-pay

## 2021-03-23 ENCOUNTER — Ambulatory Visit (INDEPENDENT_AMBULATORY_CARE_PROVIDER_SITE_OTHER): Payer: Medicaid Other | Admitting: *Deleted

## 2021-03-23 VITALS — BP 119/80 | HR 102 | Wt 243.4 lb

## 2021-03-23 DIAGNOSIS — Z1389 Encounter for screening for other disorder: Secondary | ICD-10-CM

## 2021-03-23 DIAGNOSIS — O24419 Gestational diabetes mellitus in pregnancy, unspecified control: Secondary | ICD-10-CM | POA: Diagnosis not present

## 2021-03-23 DIAGNOSIS — O288 Other abnormal findings on antenatal screening of mother: Secondary | ICD-10-CM

## 2021-03-23 DIAGNOSIS — O0990 Supervision of high risk pregnancy, unspecified, unspecified trimester: Secondary | ICD-10-CM

## 2021-03-23 DIAGNOSIS — Z331 Pregnant state, incidental: Secondary | ICD-10-CM

## 2021-03-23 LAB — POCT URINALYSIS DIPSTICK OB
Blood, UA: NEGATIVE
Glucose, UA: NEGATIVE
Ketones, UA: NEGATIVE
Leukocytes, UA: NEGATIVE
Nitrite, UA: NEGATIVE

## 2021-03-23 NOTE — Progress Notes (Addendum)
   NURSE VISIT- NST  SUBJECTIVE:  Veronica Hendrix is a 35 y.o. G67P1102 female at [redacted]w[redacted]d, here for a NST for pregnancy complicated by A2DM currently on Metformin .  She reports active fetal movement, contractions: none, vaginal bleeding: none, membranes: intact.   OBJECTIVE:  BP 119/80   Pulse (!) 102   Wt 243 lb 6.4 oz (110.4 kg)   LMP 07/30/2020 (Exact Date)   BMI 40.50 kg/m   Appears well, no apparent distress  Results for orders placed or performed in visit on 03/23/21 (from the past 24 hour(s))  POC Urinalysis Dipstick OB   Collection Time: 03/23/21 12:55 PM  Result Value Ref Range   Color, UA     Clarity, UA     Glucose, UA Negative Negative   Bilirubin, UA     Ketones, UA neg    Spec Grav, UA     Blood, UA neg    pH, UA     POC,PROTEIN,UA Trace Negative, Trace, Small (1+), Moderate (2+), Large (3+), 4+   Urobilinogen, UA     Nitrite, UA neg    Leukocytes, UA Negative Negative   Appearance     Odor      NST: FHR baseline 120 bpm, Variability: moderate, Accelerations:present, Decelerations:  Absent= Cat 1/reactive Toco: none   ASSESSMENT: [id number] at [redacted]w[redacted]d with A2DM currently on Metformin NST reactive  PLAN: EFM strip reviewed by Dr. Despina Hidden   Recommendations: keep next appointment as scheduled    Jobe Marker  03/23/2021 1:27 PM   Attestation of Attending Supervision of Nursing Visit Encounter: Evaluation and management procedures were performed by the nursing staff under my supervision and collaboration.  I have reviewed the nurse's note and chart, and I agree with the management and plan.  Rockne Coons MD Attending Physician for the Center for [facility] 03/29/2021 8:28 AM  Attestation of Attending Supervision of Nursing Visit Encounter: Evaluation and management procedures were performed by the nursing staff under my supervision and collaboration.  I have reviewed the nurse's note and chart, and I agree with the management and plan.  Rockne Coons MD Attending Physician for the Center for [facility] Health 03/30/2021 11:45 AM

## 2021-03-27 ENCOUNTER — Ambulatory Visit (INDEPENDENT_AMBULATORY_CARE_PROVIDER_SITE_OTHER): Payer: Medicaid Other | Admitting: Obstetrics & Gynecology

## 2021-03-27 ENCOUNTER — Other Ambulatory Visit: Payer: Self-pay

## 2021-03-27 ENCOUNTER — Encounter: Payer: Self-pay | Admitting: Obstetrics & Gynecology

## 2021-03-27 VITALS — BP 121/76 | HR 118 | Ht 65.0 in | Wt 245.0 lb

## 2021-03-27 DIAGNOSIS — O24415 Gestational diabetes mellitus in pregnancy, controlled by oral hypoglycemic drugs: Secondary | ICD-10-CM

## 2021-03-27 DIAGNOSIS — Z1389 Encounter for screening for other disorder: Secondary | ICD-10-CM

## 2021-03-27 DIAGNOSIS — O0993 Supervision of high risk pregnancy, unspecified, third trimester: Secondary | ICD-10-CM

## 2021-03-27 LAB — POCT URINALYSIS DIPSTICK OB
Blood, UA: NEGATIVE
Glucose, UA: NEGATIVE
Ketones, UA: NEGATIVE
Nitrite, UA: NEGATIVE
POC,PROTEIN,UA: NEGATIVE

## 2021-03-27 MED ORDER — METFORMIN HCL 1000 MG PO TABS: 1000.0000 mg | ORAL_TABLET | Freq: Two times a day (BID) | ORAL | 3 refills | Status: DC

## 2021-03-27 NOTE — Progress Notes (Signed)
HIGH-RISK PREGNANCY VISIT Patient name: Veronica Hendrix MRN [id number]  Date of birth: November 01, 1985 Chief Complaint:   Routine Prenatal Visit and Non-stress Test  History of Present Illness:   Veronica Hendrix is a 35 y.o. 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 Still feeling movement, but not as much as normal.  She did not complete fetal kick count- not sure how much/little  Contractions: Not present. Vag. Bleeding: None.  Movement: Present. denies leaking of fluid.   [facility] 02/19/2021 11/02/2020 09/13/2020 05/21/2018 04/17/2018  Decreased Interest _0 0 1  Down, Depressed, Hopeless 1 1 0 0 1  PHQ - 2 Score _1 0 2  Altered sleeping _2 - 0  Tired, decreased energy _3 - 1  Change in appetite 0 1 1 - 1  Feeling bad or failure about yourself  0 2 1 - 0  Trouble concentrating 2 1 0 - 1  Moving slowly or fidgety/restless 0 0 0 - 0  Suicidal thoughts 0 0 0 - 0  PHQ-9 Score _4 - 5  Difficult doing work/chores - - - - Somewhat difficult  Some encounter information is confidential and restricted. Go to Review Flowsheets activity to see all data.  Some recent data might be hidden     Current Outpatient Medications  Medication Instructions  . Accu-Chek Softclix Lancets lancets Use as instructed to check blood sugar 4 times daily  . acetaminophen (TYLENOL) 1,000 mg, Oral, Every 6 hours PRN  . aspirin EC 162 mg, Oral, Daily  . Blood Glucose Monitoring Suppl (ACCU-CHEK GUIDE ME) w/Device KIT 1 each, Does not apply, 4 times daily  . Blood Pressure Monitor MISC For regular home bp monitoring during pregnancy  . glucose blood (ACCU-CHEK GUIDE) test strip Use as instructed to check blood sugar 4 times daily  . loratadine (CLARITIN) 10 mg, Oral, Daily  .  metFORMIN (GLUCOPHAGE) 500 mg, Oral, 2 times daily with meals  . Prenatal Vit-Fe Fumarate-FA (PRENATAL VITAMIN PO) 1 tablet, Oral, Daily  . sertraline (ZOLOFT) 25 mg, Oral, Daily     Review of Systems:   Pertinent items are noted in HPI Denies abnormal vaginal discharge w/ itching/odor/irritation, headaches, visual changes, shortness of breath, chest pain, abdominal pain, severe nausea/vomiting, or problems with urination or bowel movements unless otherwise stated above. Pertinent History Reviewed:  Reviewed past medical,surgical, social, obstetrical and family history.  Reviewed problem list, medications and allergies. Physical Assessment:   Vitals:   03/27/21 1028  BP: 121/76  Pulse: (!) 118  Weight: 245 lb (111.1 kg)  Height: _5  (1.651 m)  Body mass index is 40.77 kg/m.           Physical Examination:   General appearance: alert, well appearing, and in no distress  Mental status: alert, oriented to person, place, and time  Skin: warm & dry   Extremities: Edema: None    Cardiovascular: normal heart rate noted  Respiratory: normal respiratory effort, no distress  Abdomen: gravid, soft, non-tender  Pelvic: Cervical exam deferred         Fetal Status:     Movement: Present    Fetal Surveillance Testing today: NST  NST being performed due to GDMShoreline Asc IncFetal Monitoring:  Baseline:  140 bpm, Variability: moderate, Accelerations: present, The accelerations are >15 bpm and more than 2 in 20 minutes, and Decelerations: Absent   Final diagnosis:  Reactive NST   Chaperone: N/A    Results for orders placed or performed in visit on 03/27/21 (from the past 24 hour(s))  POC Urinalysis Dipstick OB   Collection Time: 03/27/21 10:51 AM  Result Value Ref Range   Color, UA     Clarity, UA     Glucose, UA Negative Negative   Bilirubin, UA     Ketones, UA neg    Spec Grav, UA     Blood, UA neg    pH, UA     POC,PROTEIN,UA Negative Negative, Trace, Small (1+), Moderate (2+),  Large (3+), 4+   Urobilinogen, UA     Nitrite, UA neg    Leukocytes, UA Small (1+) (A) Negative   Appearance     Odor       Assessment & Plan:  High-risk pregnancy: [id number] at 59w2dwith an Estimated Date of Delivery: 05/06/21   1) GDMA2 ->50% of sugars elevated- recent increase in metformin end of last week -plan to increase to 10088mtwice daily -if still elevated may need to consider starting NPH @ bedtime -continue antepartum testing twice weekly -continue growth q 4wks _0  plan for delivery @ 39wk  2) Anxiety/Dep On zoloft daily- mood appropriate  Meds: No orders of the defined types were placed in this encounter.   Labs/procedures today: NST  Treatment Plan:  Continue OB care as outlined above  Reviewed: Preterm labor symptoms and general obstetric precautions including but not limited to vaginal bleeding, contractions, leaking of fluid and fetal movement were reviewed in detail with the patient.  All questions were answered. Pt has home bp cuff. Check bp weekly, let usKoreanow if >140/90.   Follow-up: Return in about 1 week (around 04/03/2021) for as scheduled Friday, HROB visit weekly.   Future Appointments  Date Time Provider [place]6/07/2021 11:10 AM [facility] NURSE [facility]  04/03/2021 11:10 AM BoRoma SchanzCNM [facility]  04/06/2021  9:50 AM [facility] NURSE [facility]  04/10/2021  3:45 PM [place] [facility]TO[facility]YN [place]WH-FTIMG None  04/10/2021  4:40 PM OzJanyth PupaDO [facility]  04/13/2021 11:30 AM [facility] NURSE [facility]  04/17/2021 10:10 AM BoRoma SchanzCNM [facility]  04/20/2021 10:50 AM [facility] NURSE [facility]  04/24/2021 10:30 AM EuFlorian BuffMD [facility]7/05/2021  9:50 AM [facility] NURSE [facility]    Orders Placed This Encounter  Procedures  . POC Urinalysis Dipstick OB    JeJanyth PupaDO Attending ObLemoyneFaHosp General Castan[facility] Inc[facility] Wo[facility]

## 2021-03-30 ENCOUNTER — Other Ambulatory Visit: Payer: Self-pay

## 2021-03-30 ENCOUNTER — Ambulatory Visit (INDEPENDENT_AMBULATORY_CARE_PROVIDER_SITE_OTHER): Payer: Medicaid Other | Admitting: *Deleted

## 2021-03-30 DIAGNOSIS — O0990 Supervision of high risk pregnancy, unspecified, unspecified trimester: Secondary | ICD-10-CM | POA: Diagnosis not present

## 2021-03-30 DIAGNOSIS — O24419 Gestational diabetes mellitus in pregnancy, unspecified control: Secondary | ICD-10-CM

## 2021-03-30 NOTE — Progress Notes (Addendum)
   NURSE VISIT- NST  SUBJECTIVE:  Veronica Hendrix is a 35 y.o. G61P1102 female at [redacted]w[redacted]d, here for a NST for pregnancy complicated by A2DM currently on Metformin .  She reports active fetal movement, contractions: none, vaginal bleeding: none, membranes: intact.   OBJECTIVE:  BP 127/82   Pulse (!) 113   Wt 247 lb (112 kg)   LMP 07/30/2020 (Exact Date)   BMI 41.10 kg/m   Appears well, no apparent distress  No results found for this or any previous visit (from the past 24 hour(s)).  NST: FHR baseline 140 bpm, Variability: moderate, Accelerations:present, Decelerations:  Absent= Cat 1/reactive Toco: none   ASSESSMENT: [id number] at [redacted]w[redacted]d with A2DM currently on Metformin NST reactive  PLAN: EFM strip reviewed by Dr. Despina Hidden   Recommendations: keep next appointment as scheduled    Jobe Marker  03/30/2021 1:08 PM  Attestation of Attending Supervision of Nursing Visit Encounter: Evaluation and management procedures were performed by the nursing staff under my supervision and collaboration.  I have reviewed the nurse's note and chart, and I agree with the management and plan.  Rockne Coons MD Attending Physician for the Center for [facility] 03/31/2021 11:03 PM

## 2021-04-03 ENCOUNTER — Other Ambulatory Visit: Payer: Self-pay

## 2021-04-03 ENCOUNTER — Ambulatory Visit (INDEPENDENT_AMBULATORY_CARE_PROVIDER_SITE_OTHER): Payer: Medicaid Other | Admitting: Women's Health

## 2021-04-03 ENCOUNTER — Encounter: Payer: Self-pay | Admitting: Women's Health

## 2021-04-03 VITALS — BP 123/81 | HR 111 | Wt 245.8 lb

## 2021-04-03 DIAGNOSIS — O0993 Supervision of high risk pregnancy, unspecified, third trimester: Secondary | ICD-10-CM

## 2021-04-03 DIAGNOSIS — O24419 Gestational diabetes mellitus in pregnancy, unspecified control: Secondary | ICD-10-CM

## 2021-04-03 DIAGNOSIS — Z1389 Encounter for screening for other disorder: Secondary | ICD-10-CM

## 2021-04-03 DIAGNOSIS — O0990 Supervision of high risk pregnancy, unspecified, unspecified trimester: Secondary | ICD-10-CM

## 2021-04-03 LAB — POCT URINALYSIS DIPSTICK OB
Blood, UA: NEGATIVE
Glucose, UA: NEGATIVE
Ketones, UA: NEGATIVE
Leukocytes, UA: NEGATIVE
Nitrite, UA: NEGATIVE

## 2021-04-03 MED ORDER — GLYBURIDE 5 MG PO TABS: 5.0000 mg | ORAL_TABLET | Freq: Every day | ORAL | 1 refills | Status: DC

## 2021-04-03 NOTE — Progress Notes (Signed)
HIGH-RISK PREGNANCY VISIT Patient name: Veronica Hendrix MRN [id number]  Date of birth: 03-Apr-1986 Chief Complaint:   Routine Prenatal Visit, High Risk Gestation, and Non-stress Test  History of Present Illness:   Veronica Hendrix is a 35 y.o. G62P1102 female at [redacted]w[redacted]d with an Estimated Date of Delivery: 05/06/21 being seen today for ongoing management of a high-risk pregnancy complicated by diabetes mellitus A2DM currently on metformin 1,000mg  BID, previous polyhydramnios (improved to 23cm at last check) .    Today she reports  FBS 96-113, 2hr 93-138 (only 3 >120) . Headache today, hasn't taken anything for it yet. Denies visual changes, ruq/epigastric pain, n/v.   Contractions: Not present. Vag. Bleeding: None.  Movement: Present. denies leaking of fluid.   Depression screen [facility] 2/9 02/19/2021 11/02/2020 09/13/2020 05/21/2018 04/17/2018  Decreased Interest 2 1 1  0 1  Down, Depressed, Hopeless 1 1 0 0 1  PHQ - 2 Score 3 2 1  0 2  Altered sleeping 2 1 1  - 0  Tired, decreased energy 1 2 1  - 1  Change in appetite 0 1 1 - 1  Feeling bad or failure about yourself  0 2 1 - 0  Trouble concentrating 2 1 0 - 1  Moving slowly or fidgety/restless 0 0 0 - 0  Suicidal thoughts 0 0 0 - 0  PHQ-9 Score 8 9 5  - 5  Difficult doing work/chores - - - - Somewhat difficult  Some encounter information is confidential and restricted. Go to Review Flowsheets activity to see all data.  Some recent data might be hidden     GAD 7 : Generalized Anxiety Score 02/19/2021 11/02/2020 09/13/2020 11/07/2017  Nervous, Anxious, on Edge 2 3 1 3   Control/stop worrying 2 3 1 3   Worry too much - different things 2 3 1 3   Trouble relaxing 1 2 1 3   Restless 1 2 1 3   Easily annoyed or irritable 2 3 1 3   Afraid - awful might happen 1 1 1 3   Total GAD 7 Score 11 17 7 21   Anxiety Difficulty - - - Very difficult  Some encounter information is confidential and restricted. Go to Review Flowsheets activity to see all data.     Review of  Systems:   Pertinent items are noted in HPI Denies abnormal vaginal discharge w/ itching/odor/irritation, headaches, visual changes, shortness of breath, chest pain, abdominal pain, severe nausea/vomiting, or problems with urination or bowel movements unless otherwise stated above. Pertinent History Reviewed:  Reviewed past medical,surgical, social, obstetrical and family history.  Reviewed problem list, medications and allergies. Physical Assessment:   Vitals:   04/03/21 1051  BP: 123/81  Pulse: (!) 111  Weight: 245 lb 12.8 oz (111.5 kg)  Body mass index is 40.9 kg/m.           Physical Examination:   General appearance: alert, well appearing, and in no distress  Mental status: alert, oriented to person, place, and time  Skin: warm & dry   Extremities: Edema: None    Cardiovascular: normal heart rate noted  Respiratory: normal respiratory effort, no distress  Abdomen: gravid, soft, non-tender  Pelvic: Cervical exam deferred         Fetal Status: Fetal Heart Rate (bpm): 125   Movement: Present    Fetal Surveillance Testing today: NST: FHR baseline 125 bpm, Variability: moderate, Accelerations:present, Decelerations:  Absent= Cat 1/reactive Toco: none    Chaperone: N/A    Results for orders placed or performed in visit on  04/03/21 (from the past 24 hour(s))  POC Urinalysis Dipstick OB   Collection Time: 04/03/21 10:59 AM  Result Value Ref Range   Color, UA     Clarity, UA     Glucose, UA Negative Negative   Bilirubin, UA     Ketones, UA neg    Spec Grav, UA     Blood, UA neg    pH, UA     POC,PROTEIN,UA Trace Negative, Trace, Small (1+), Moderate (2+), Large (3+), 4+   Urobilinogen, UA     Nitrite, UA neg    Leukocytes, UA Negative Negative   Appearance     Odor      Assessment & Plan:  High-risk pregnancy: [id number] at [redacted]w[redacted]d with an Estimated Date of Delivery: 05/06/21   1) A2DM, unstable, discussed sugars w/ LHE, add glyburide 5mg  @ qhs (continue metformin 1000mg   BID- take PM dose at supper). EFW next week as scheduled  2) H/O severe pre-e, bp good today, headache but hasn't taken apap. To take apap when gets home. Reviewed pre-e s/s, reasons to seek care. Continue ASA  3) Previous polyhydramnios> improved to 23cm at last check  Meds:  Meds ordered this encounter  Medications   glyBURIDE (DIABETA) 5 MG tablet    Sig: Take 1 tablet (5 mg total) by mouth at bedtime.    Dispense:  30 tablet    Refill:  1    Order Specific Question:   Supervising Provider    Answer:   [[id number]]    Labs/procedures today: NST  Treatment Plan:  EFW/BPP next week as scheduled, 2x/wk NST   Deliver @ 39-39.6wks:______   Reviewed: Preterm labor symptoms and general obstetric precautions including but not limited to vaginal bleeding, contractions, leaking of fluid and fetal movement were reviewed in detail with the patient.  All questions were answered. Does have home bp cuff. Office bp cuff given: not applicable. Check bp daily, let know if consistently >140 and/or >90.  Follow-up: Return for As scheduled.   Future Appointments  Date Time Provider Department Center  04/06/2021  9:50 AM [facility] NURSE [facility]  04/10/2021  3:45 PM [facility] 04/08/2021 [facility] None  04/10/2021  4:40 PM [place], DO [facility]  04/13/2021 11:30 AM [facility] NURSE [facility]  04/17/2021 10:10 AM 04/15/2021, CNM [facility]  04/20/2021 10:50 AM [facility] NURSE [facility]  04/24/2021 10:30 AM 06/21/2021, MD [facility]  04/27/2021  9:50 AM [facility] NURSE [facility]    Orders Placed This Encounter  Procedures   [facility] OB Follow Up   06/28/2021 FETAL BPP WO NON STRESS   POC Urinalysis Dipstick OB   US CNM, [facility] 04/03/2021 11:52 AM

## 2021-04-03 NOTE — Patient Instructions (Signed)
Veronica Hendrix, thank you for choosing our office today! We appreciate the opportunity to meet your healthcare needs. You may receive a short survey by mail, e-mail, or through MyChart. If you are happy with your care we would appreciate if you could take just a few minutes to complete the survey questions. We read all of your comments and take your feedback very seriously. Thank you again for choosing our office.  [facility] [place] ([address] [place], [place] [address]) Entrance C, located off of [address] Free 24/7 valet parking   CLASSES: Go to [email or web address] to register for classes (childbirth, breastfeeding, waterbirth, infant CPR, daddy bootcamp, etc.)  Call the office ([phone]) or go to [facility] if: You begin to have strong, frequent contractions Your water breaks.  Sometimes it is a big gush of fluid, sometimes it is just a trickle that keeps getting your panties wet or running down your legs You have vaginal bleeding.  It is normal to have a small amount of spotting if your cervix was checked.  You don't feel your baby moving like normal.  If you don't, get you something to eat and drink and lay down and focus on feeling your baby move.   If your baby is still not moving like normal, you should call the office or go to [facility].  Call the office ([phone]) or go to [facility] for these signs of pre-eclampsia: Severe headache that does not go away with Tylenol Visual changes- seeing spots, double, blurred vision Pain under your right breast or upper abdomen that does not go away with Tums or heartburn medicine Nausea and/or vomiting Severe swelling in your hands, feet, and face   Tdap Vaccine It is recommended that you get the Tdap vaccine during the third trimester of EACH pregnancy to help protect your baby from getting pertussis (whooping cough) 27-36 weeks is the BEST time to do this  so that you can pass the protection on to your baby. During pregnancy is better than after pregnancy, but if you are unable to get it during pregnancy it will be offered at the hospital.  You can get this vaccine with us, at the health department, your family doctor, or some local pharmacies Everyone who will be around your baby should also be up-to-date on their vaccines before the baby comes. Adults (who are not pregnant) only need 1 dose of Tdap during adulthood.   [place] Pediatricians/Family Doctors [facility] ([facility]): [address], [phone]           [facility]: [address], [phone]                [facility] ([facility]): [address], [phone] (call to ask if accepting patients) [facility]: [address], [place], [phone]    [place] Pediatricians/Family Doctors [facility] ([facility]): [address], [phone] [facility]: [address], [phone] [facility] [place]: [address], [phone]  [place] [facility]  [facility] ([facility]): [phone] [facility]: [address], [phone]   [place] Family Doctors [facility]: [address], [phone]  [place] [facility]  [facility]: [address], [phone]  Home Blood Pressure Monitoring for Patients   Your provider has recommended that you check your   blood pressure (BP) at least once a week at home. If you do not have a blood pressure cuff at home, one will be provided for you. Contact your provider if you have not received your monitor within 1 week.   Helpful Tips for Accurate Home Blood Pressure Checks  Don't smoke, exercise, or drink caffeine 30 minutes before checking your BP Use the restroom before checking your BP (a full bladder can raise your  pressure) Relax in a comfortable upright chair Feet on the ground Left arm resting comfortably on a flat surface at the level of your heart Legs uncrossed Back supported Sit quietly and don't talk Place the cuff on your bare arm Adjust snuggly, so that only two fingertips can fit between your skin and the top of the cuff Check 2 readings separated by at least one minute Keep a log of your BP readings For a visual, please reference this diagram: http://ccnc.care/bpdiagram  Provider Name: [facility]     Phone: [phone]  Zone 1: ALL CLEAR  Continue to monitor your symptoms:  BP reading is less than 140 (top number) or less than 90 (bottom number)  No right upper stomach pain No headaches or seeing spots No feeling nauseated or throwing up No swelling in face and hands  Zone 2: CAUTION Call your doctor's office for any of the following:  BP reading is greater than 140 (top number) or greater than 90 (bottom number)  Stomach pain under your ribs in the middle or right side Headaches or seeing spots Feeling nauseated or throwing up Swelling in face and hands  Zone 3: EMERGENCY  Seek immediate medical care if you have any of the following:  BP reading is greater than160 (top number) or greater than 110 (bottom number) Severe headaches not improving with Tylenol Serious difficulty catching your breath Any worsening symptoms from Zone 2  Preterm Labor and Birth Information  The normal length of a pregnancy is 39-41 weeks. Preterm labor is when labor starts before 37 completed weeks of pregnancy. What are the risk factors for preterm labor? Preterm labor is more likely to occur in women who: Have certain infections during pregnancy such as a bladder infection, sexually transmitted infection, or infection inside the uterus (chorioamnionitis). Have a shorter-than-normal cervix. Have gone into preterm labor before. Have had surgery on their cervix. Are younger than age 17  or older than age 35. Are African American. Are pregnant with twins or multiple babies (multiple gestation). Take street drugs or smoke while pregnant. Do not gain enough weight while pregnant. Became pregnant shortly after having been pregnant. What are the symptoms of preterm labor? Symptoms of preterm labor include: Cramps similar to those that can happen during a menstrual period. The cramps may happen with diarrhea. Pain in the abdomen or lower back. Regular uterine contractions that may feel like tightening of the abdomen. A feeling of increased pressure in the pelvis. Increased watery or bloody mucus discharge from the vagina. Water breaking (ruptured amniotic sac). Why is it important to recognize signs of preterm labor? It is important to recognize signs of preterm labor because babies who are born prematurely may not be fully developed. This can put them at an increased risk for: Long-term (chronic) heart and lung problems. Difficulty immediately after birth with regulating body systems, including blood sugar, body temperature, heart rate, and breathing rate. Bleeding in the brain. Cerebral palsy. Learning difficulties. Death. These risks are highest for babies who are born before 34 weeks   of pregnancy. How is preterm labor treated? Treatment depends on the length of your pregnancy, your condition, and the health of your baby. It may involve: Having a stitch (suture) placed in your cervix to prevent your cervix from opening too early (cerclage). Taking or being given medicines, such as: Hormone medicines. These may be given early in pregnancy to help support the pregnancy. Medicine to stop contractions. Medicines to help mature the baby's lungs. These may be prescribed if the risk of delivery is high. Medicines to prevent your baby from developing cerebral palsy. If the labor happens before 34 weeks of pregnancy, you may need to stay in the hospital. What should I do if I  think I am in preterm labor? If you think that you are going into preterm labor, call your health care provider right away. How can I prevent preterm labor in future pregnancies? To increase your chance of having a full-term pregnancy: Do not use any tobacco products, such as cigarettes, chewing tobacco, and e-cigarettes. If you need help quitting, ask your health care provider. Do not use street drugs or medicines that have not been prescribed to you during your pregnancy. Talk with your health care provider before taking any herbal supplements, even if you have been taking them regularly. Make sure you gain a healthy amount of weight during your pregnancy. Watch for infection. If you think that you might have an infection, get it checked right away. Make sure to tell your health care provider if you have gone into preterm labor before. This information is not intended to replace advice given to you by your health care provider. Make sure you discuss any questions you have with your health care provider. Document Revised: 01/29/2019 Document Reviewed: 02/28/2016 Elsevier Patient Education  2020 Elsevier Inc.   

## 2021-04-06 ENCOUNTER — Ambulatory Visit (INDEPENDENT_AMBULATORY_CARE_PROVIDER_SITE_OTHER): Payer: Medicaid Other | Admitting: *Deleted

## 2021-04-06 ENCOUNTER — Other Ambulatory Visit: Payer: Self-pay

## 2021-04-06 VITALS — BP 121/77 | HR 110 | Wt 246.0 lb

## 2021-04-06 DIAGNOSIS — Z1389 Encounter for screening for other disorder: Secondary | ICD-10-CM | POA: Diagnosis not present

## 2021-04-06 DIAGNOSIS — Z3A35 35 weeks gestation of pregnancy: Secondary | ICD-10-CM

## 2021-04-06 DIAGNOSIS — O0990 Supervision of high risk pregnancy, unspecified, unspecified trimester: Secondary | ICD-10-CM | POA: Diagnosis not present

## 2021-04-06 DIAGNOSIS — O24419 Gestational diabetes mellitus in pregnancy, unspecified control: Secondary | ICD-10-CM | POA: Diagnosis not present

## 2021-04-06 DIAGNOSIS — O288 Other abnormal findings on antenatal screening of mother: Secondary | ICD-10-CM

## 2021-04-06 DIAGNOSIS — Z331 Pregnant state, incidental: Secondary | ICD-10-CM

## 2021-04-06 LAB — POCT URINALYSIS DIPSTICK OB
Blood, UA: NEGATIVE
Glucose, UA: NEGATIVE
Ketones, UA: NEGATIVE
Nitrite, UA: NEGATIVE
POC,PROTEIN,UA: NEGATIVE

## 2021-04-06 NOTE — Progress Notes (Addendum)
   NURSE VISIT- NST  SUBJECTIVE:  Veronica Hendrix is a 35 y.o. G12P1102 female at [redacted]w[redacted]d, here for a NST for pregnancy complicated by A2DM on Metformin and glyburide.  She reports active fetal movement, contractions: none, vaginal bleeding: none, membranes: intact.   States fasting blood sugars are low and she is symptomatic(shaking, nauseated). Was started on Glyburide 5mg  at night.  OBJECTIVE:  BP 121/77   Pulse (!) 110   Wt 246 lb (111.6 kg)   LMP 07/30/2020 (Exact Date)   BMI 40.94 kg/m   Appears well, no apparent distress  Results for orders placed or performed in visit on 04/06/21 (from the past 24 hour(s))  POC Urinalysis Dipstick OB   Collection Time: 04/06/21 10:13 AM  Result Value Ref Range   Color, UA     Clarity, UA     Glucose, UA Negative Negative   Bilirubin, UA     Ketones, UA neg    Spec Grav, UA     Blood, UA neg    pH, UA     POC,PROTEIN,UA Negative Negative, Trace, Small (1+), Moderate (2+), Large (3+), 4+   Urobilinogen, UA     Nitrite, UA neg    Leukocytes, UA Moderate (2+) (A) Negative   Appearance     Odor      NST: FHR baseline 130 bpm, Variability: moderate, Accelerations:present, Decelerations:  Absent= Cat 1/reactive Toco: none   ASSESSMENT: 04/08/21 at [redacted]w[redacted]d with A2DM currently on Metformin and Glyburide NST reactive  PLAN: EFM strip and glucose readings reviewed by Dr. [redacted]w[redacted]d   Recommendations: reduce Glyburide to 2.5mg  at bedtime keep next appointment as scheduled    Despina Hidden  04/06/2021 10:44 AM   Attestation of Attending Supervision of Nursing Visit Encounter: Evaluation and management procedures were performed by the nursing staff under my supervision and collaboration.  I have reviewed the nurse's note and chart, and I agree with the management and plan.  04/08/2021 MD Attending Physician for the [facility] 04/06/2021 11:02 PM

## 2021-04-10 ENCOUNTER — Ambulatory Visit (INDEPENDENT_AMBULATORY_CARE_PROVIDER_SITE_OTHER): Payer: Medicaid Other

## 2021-04-10 ENCOUNTER — Other Ambulatory Visit (HOSPITAL_COMMUNITY)
Admission: RE | Admit: 2021-04-10 | Discharge: 2021-04-10 | Disposition: A | Discharge status: Home / Self Care | Payer: Medicaid Other | Source: Ambulatory Visit | Attending: Obstetrics & Gynecology | Admitting: Obstetrics & Gynecology

## 2021-04-10 ENCOUNTER — Ambulatory Visit (INDEPENDENT_AMBULATORY_CARE_PROVIDER_SITE_OTHER): Payer: Medicaid Other | Admitting: Obstetrics & Gynecology

## 2021-04-10 ENCOUNTER — Other Ambulatory Visit: Payer: Medicaid Other

## 2021-04-10 ENCOUNTER — Encounter: Payer: Medicaid Other | Admitting: Obstetrics & Gynecology

## 2021-04-10 ENCOUNTER — Encounter: Payer: Self-pay | Admitting: Obstetrics & Gynecology

## 2021-04-10 ENCOUNTER — Other Ambulatory Visit: Payer: Self-pay

## 2021-04-10 VITALS — BP 123/80 | HR 99 | Wt 244.2 lb

## 2021-04-10 DIAGNOSIS — O133 Gestational [pregnancy-induced] hypertension without significant proteinuria, third trimester: Secondary | ICD-10-CM | POA: Insufficient documentation

## 2021-04-10 DIAGNOSIS — O24419 Gestational diabetes mellitus in pregnancy, unspecified control: Secondary | ICD-10-CM | POA: Diagnosis not present

## 2021-04-10 DIAGNOSIS — Z3A36 36 weeks gestation of pregnancy: Secondary | ICD-10-CM

## 2021-04-10 DIAGNOSIS — O0993 Supervision of high risk pregnancy, unspecified, third trimester: Secondary | ICD-10-CM | POA: Diagnosis not present

## 2021-04-10 DIAGNOSIS — O09893 Supervision of other high risk pregnancies, third trimester: Secondary | ICD-10-CM

## 2021-04-10 DIAGNOSIS — O0990 Supervision of high risk pregnancy, unspecified, unspecified trimester: Secondary | ICD-10-CM

## 2021-04-10 NOTE — Progress Notes (Signed)
[place] 36+2 wks,cephalic,anterior placenta gr 3,fhr 138 bpm,BPP 8/8,AFI 17.5 cm,EFW 2813 g 44%

## 2021-04-10 NOTE — Progress Notes (Signed)
HIGH-RISK PREGNANCY VISIT Patient name: Veronica Hendrix MRN [id number]  Date of birth: May 10, 1986 Chief Complaint:   Routine Prenatal Visit  History of Present Illness:   Veronica Hendrix is a 35 y.o. [id number] female at 16w2dwith an Estimated Date of Delivery: 05/06/21 being seen today for ongoing management of a high-risk pregnancy complicated by: GDMA2 -currently on Metformin 10023mbid, glyburide 2.18m67mt night -previous polyhydramnios- improving at last visit -sugars mostly controlled  Gestational HTN -BP chart reviewed- pt has had >2 BP 140/90s  Today she reports  occasional headaches- no headache currently.  Denies change in her vision, no RUQ pain .   Contractions: Not present. Vag. Bleeding: None.  Movement: Present. denies leaking of fluid.   [facility] 02/19/2021 11/02/2020 09/13/2020 05/21/2018 04/17/2018  Decreased Interest _0 0 1  Down, Depressed, Hopeless 1 1 0 0 1  PHQ - 2 Score _1 0 2  Altered sleeping _2 - 0  Tired, decreased energy _3 - 1  Change in appetite 0 1 1 - 1  Feeling bad or failure about yourself  0 2 1 - 0  Trouble concentrating 2 1 0 - 1  Moving slowly or fidgety/restless 0 0 0 - 0  Suicidal thoughts 0 0 0 - 0  PHQ-9 Score _4 - 5  Difficult doing work/chores - - - - Somewhat difficult  Some encounter information is confidential and restricted. Go to Review Flowsheets activity to see all data.  Some recent data might be hidden     Current Outpatient Medications  Medication Instructions   Accu-Chek Softclix Lancets lancets Use as instructed to check blood sugar 4 times daily   acetaminophen (TYLENOL) 1,000 mg, Every 6 hours PRN   aspirin EC 162 mg, Oral, Daily   Blood Glucose Monitoring Suppl (ACCU-CHEK GUIDE ME) w/Device KIT 1 each, Does not apply, 4 times daily   Blood Pressure Monitor MISC For regular home bp monitoring during pregnancy   glucose blood (ACCU-CHEK GUIDE) test strip Use as instructed to check blood sugar 4  times daily   glyBURIDE (DIABETA) 5 mg, Oral, Daily at bedtime   loratadine (CLARITIN) 10 mg, Oral, Daily   metFORMIN (GLUCOPHAGE) 1,000 mg, Oral, 2 times daily with meals   Prenatal Vit-Fe Fumarate-FA (PRENATAL VITAMIN PO) 1 tablet, Oral, Daily   sertraline (ZOLOFT) 25 mg, Oral, Daily     Review of Systems:   Pertinent items are noted in HPI Denies abnormal vaginal discharge w/ itching/odor/irritation, visual changes, shortness of breath, chest pain, abdominal pain, severe nausea/vomiting, or problems with urination or bowel movements unless otherwise stated above. Pertinent History Reviewed:  Reviewed past medical,surgical, social, obstetrical and family history.  Reviewed problem list, medications and allergies. Physical Assessment:   Vitals:   04/10/21 1408  BP: 123/80  Pulse: 99  Weight: 244 lb 3.2 oz (110.8 kg)  Body mass index is 40.64 kg/m.           Physical Examination:   General appearance: alert, well appearing, and in no distress  Mental status: alert, oriented to person, place, and time  Skin: warm & dry   Extremities: Edema: None    Cardiovascular: normal heart rate noted  Respiratory: normal respiratory effort, no distress  Abdomen: gravid, soft, non-tender  Pelvic: Cervical exam performed  Dilation: Closed Effacement (%): Thick Station: -3  Fetal Status: Fetal Heart Rate (bpm): 138 by [place]Movement: Present Presentation: Vertex  Fetal Surveillance Testing today: BPP   Chaperone: Celene Squibb    No results found for this or any previous visit (from the past 24 hour(s)).   Assessment & Plan:  High-risk pregnancy: E3P2951 at 20w2dwith an Estimated Date of Delivery: 05/06/21   1) Gest HTN -plan for IOL @ 37wk- scheduled for Monday -reviewed preeclampsia precautions  2) GDMA2 -continue current meds- metformin 10086mbid and glyburide 2.64m29mUS Koreaday: cephalic,anterior placenta gr 3,fhr 138 bpm,BPP 8/8,AFI 17.5 cm,EFW 2813 g 44% -continue with twice  weekly testing  Meds: No orders of the defined types were placed in this encounter.   Labs/procedures today: GBS, GC/C collected.  BPP today 8/8  Treatment Plan:  GBS/GC/C collected.  IOL scheduled for Monday  Reviewed: Preterm labor symptoms and general obstetric precautions including but not limited to vaginal bleeding, contractions, leaking of fluid and fetal movement were reviewed in detail with the patient.  All questions were answered. Pt home bp cuff. Check bp daily, let us [place]ow if >140/90.   Follow-up: Return in about 1 week (around 04/17/2021) for HROB/antepartum twice weekly.   Future Appointments  Date Time Provider DepLodi/24/2022 11:30 AM [facility] NURSE [facility]  04/16/2021 12:00 AM [facility] SCHOle[facility] None  04/24/2021 10:30 AM [facility] NURSE [facility]    Orders Placed This Encounter  Procedures   Culture, beta strep (group b only)    JenJanyth PupaO Attending ObsEast CarrollacMiddlesboroughr WomDean Foods CompanyonL[place]

## 2021-04-12 ENCOUNTER — Other Ambulatory Visit (HOSPITAL_COMMUNITY): Payer: Self-pay | Admitting: Advanced Practice Midwife

## 2021-04-12 LAB — CERVICOVAGINAL ANCILLARY ONLY
Chlamydia: NEGATIVE
Comment: NEGATIVE
Comment: NORMAL
Neisseria Gonorrhea: NEGATIVE

## 2021-04-13 ENCOUNTER — Other Ambulatory Visit: Payer: Self-pay

## 2021-04-13 ENCOUNTER — Ambulatory Visit (INDEPENDENT_AMBULATORY_CARE_PROVIDER_SITE_OTHER): Payer: Medicaid Other

## 2021-04-13 VITALS — BP 120/82 | HR 99 | Wt 244.0 lb

## 2021-04-13 DIAGNOSIS — Z1389 Encounter for screening for other disorder: Secondary | ICD-10-CM

## 2021-04-13 DIAGNOSIS — Z3A36 36 weeks gestation of pregnancy: Secondary | ICD-10-CM

## 2021-04-13 DIAGNOSIS — O288 Other abnormal findings on antenatal screening of mother: Secondary | ICD-10-CM | POA: Diagnosis not present

## 2021-04-13 DIAGNOSIS — O0990 Supervision of high risk pregnancy, unspecified, unspecified trimester: Secondary | ICD-10-CM | POA: Diagnosis not present

## 2021-04-13 DIAGNOSIS — Z331 Pregnant state, incidental: Secondary | ICD-10-CM

## 2021-04-13 LAB — POCT URINALYSIS DIPSTICK OB
Blood, UA: NEGATIVE
Glucose, UA: NEGATIVE
Ketones, UA: NEGATIVE
Leukocytes, UA: NEGATIVE
Nitrite, UA: NEGATIVE
POC,PROTEIN,UA: NEGATIVE

## 2021-04-13 NOTE — Progress Notes (Signed)
   NURSE VISIT- NST  SUBJECTIVE:  Veronica Hendrix is a 35 y.o. G63P1102 female at [redacted]w[redacted]d, here for a NST for pregnancy complicated by A2DM currently on Metformin and Glyburide and GHTN.  She reports active fetal movement, contractions: none, vaginal bleeding: none, membranes: intact.   OBJECTIVE:  BP 120/82   Pulse 99   Wt 244 lb (110.7 kg)   LMP 07/30/2020 (Exact Date)   BMI 40.60 kg/m   Appears well, no apparent distress  Results for orders placed or performed in visit on 04/13/21 (from the past 24 hour(s))  POC Urinalysis Dipstick OB   Collection Time: 04/13/21 11:50 AM  Result Value Ref Range   Color, UA     Clarity, UA     Glucose, UA Negative Negative   Bilirubin, UA     Ketones, UA neg    Spec Grav, UA     Blood, UA neg    pH, UA     POC,PROTEIN,UA Negative Negative, Trace, Small (1+), Moderate (2+), Large (3+), 4+   Urobilinogen, UA     Nitrite, UA neg    Leukocytes, UA Negative Negative   Appearance     Odor      NST: FHR baseline 130 bpm, Variability: moderate, Accelerations:present, Decelerations:  Absent= Cat 1/reactive Toco: none   ASSESSMENT: [id number] at [redacted]w[redacted]d with A2DM currently on Metformin and Glyburide and GHTN NST reactive  PLAN: EFM strip reviewed by Veronica Hendrix, CNM, [facility]   Recommendations: keep next appointment as scheduled    Veronica Hendrix  04/13/2021 12:29 PM

## 2021-04-14 LAB — CULTURE, BETA STREP (GROUP B ONLY): Strep Gp B Culture: NEGATIVE

## 2021-04-15 ENCOUNTER — Other Ambulatory Visit: Payer: Self-pay

## 2021-04-16 ENCOUNTER — Inpatient Hospital Stay (HOSPITAL_COMMUNITY): Payer: Medicaid Other

## 2021-04-16 ENCOUNTER — Inpatient Hospital Stay (HOSPITAL_COMMUNITY)
Admission: AD | Admit: 2021-04-16 | Discharge: 2021-04-18 | DRG: 807 | Disposition: A | Discharge status: Home / Self Care | Payer: Medicaid Other | Attending: Family Medicine | Admitting: Family Medicine

## 2021-04-16 ENCOUNTER — Encounter (HOSPITAL_COMMUNITY): Payer: Self-pay | Admitting: Obstetrics & Gynecology

## 2021-04-16 DIAGNOSIS — O24425 Gestational diabetes mellitus in childbirth, controlled by oral hypoglycemic drugs: Secondary | ICD-10-CM | POA: Diagnosis not present

## 2021-04-16 DIAGNOSIS — O99344 Other mental disorders complicating childbirth: Secondary | ICD-10-CM | POA: Diagnosis present

## 2021-04-16 DIAGNOSIS — O134 Gestational [pregnancy-induced] hypertension without significant proteinuria, complicating childbirth: Secondary | ICD-10-CM | POA: Diagnosis present

## 2021-04-16 DIAGNOSIS — O133 Gestational [pregnancy-induced] hypertension without significant proteinuria, third trimester: Secondary | ICD-10-CM

## 2021-04-16 DIAGNOSIS — F32A Depression, unspecified: Secondary | ICD-10-CM | POA: Diagnosis present

## 2021-04-16 DIAGNOSIS — O139 Gestational [pregnancy-induced] hypertension without significant proteinuria, unspecified trimester: Secondary | ICD-10-CM | POA: Diagnosis present

## 2021-04-16 DIAGNOSIS — F331 Major depressive disorder, recurrent, moderate: Secondary | ICD-10-CM | POA: Diagnosis present

## 2021-04-16 DIAGNOSIS — Z23 Encounter for immunization: Secondary | ICD-10-CM

## 2021-04-16 DIAGNOSIS — O323XX0 Maternal care for face, brow and chin presentation, not applicable or unspecified: Secondary | ICD-10-CM | POA: Diagnosis present

## 2021-04-16 DIAGNOSIS — Z20822 Contact with and (suspected) exposure to covid-19: Secondary | ICD-10-CM | POA: Diagnosis present

## 2021-04-16 DIAGNOSIS — O24419 Gestational diabetes mellitus in pregnancy, unspecified control: Secondary | ICD-10-CM

## 2021-04-16 DIAGNOSIS — Z3A37 37 weeks gestation of pregnancy: Secondary | ICD-10-CM

## 2021-04-16 DIAGNOSIS — O09299 Supervision of pregnancy with other poor reproductive or obstetric history, unspecified trimester: Secondary | ICD-10-CM

## 2021-04-16 DIAGNOSIS — O09899 Supervision of other high risk pregnancies, unspecified trimester: Secondary | ICD-10-CM

## 2021-04-16 DIAGNOSIS — F419 Anxiety disorder, unspecified: Secondary | ICD-10-CM | POA: Diagnosis present

## 2021-04-16 DIAGNOSIS — O165 Unspecified maternal hypertension, complicating the puerperium: Secondary | ICD-10-CM

## 2021-04-16 LAB — COMPREHENSIVE METABOLIC PANEL
ALT: 17 U/L (ref 0–44)
AST: 19 U/L (ref 15–41)
Albumin: 2.7 g/dL — ABNORMAL LOW (ref 3.5–5.0)
Alkaline Phosphatase: 187 U/L — ABNORMAL HIGH (ref 38–126)
Anion gap: 7 (ref 5–15)
BUN: 9 mg/dL (ref 6–20)
CO2: 22 mmol/L (ref 22–32)
Calcium: 8.9 mg/dL (ref 8.9–10.3)
Chloride: 106 mmol/L (ref 98–111)
Creatinine, Ser: 0.73 mg/dL (ref 0.44–1.00)
GFR, Estimated: >60 mL/min (ref >60)
Glucose, Bld: 135 mg/dL — ABNORMAL HIGH (ref 70–99)
Potassium: 3.7 mmol/L (ref 3.5–5.1)
Sodium: 135 mmol/L (ref 135–145)
Total Bilirubin: 0.5 mg/dL (ref 0.3–1.2)
Total Protein: 6 g/dL — ABNORMAL LOW (ref 6.5–8.1)

## 2021-04-16 LAB — CBC
HCT: 33.7 % — ABNORMAL LOW (ref 36.0–46.0)
HCT: 35.9 % — ABNORMAL LOW (ref 36.0–46.0)
Hemoglobin: 11 g/dL — ABNORMAL LOW (ref 12.0–15.0)
Hemoglobin: 12.2 g/dL (ref 12.0–15.0)
MCH: 28.7 pg (ref 26.0–34.0)
MCH: 29.3 pg (ref 26.0–34.0)
MCHC: 32.6 g/dL (ref 30.0–36.0)
MCHC: 34 g/dL (ref 30.0–36.0)
MCV: 88 fL (ref 80.0–100.0)
MCV: 86.1 fL (ref 80.0–100.0)
Platelets: 301 10*3/uL (ref 150–400)
Platelets: 336 10*3/uL (ref 150–400)
RBC: 3.83 MIL/uL — ABNORMAL LOW (ref 3.87–5.11)
RBC: 4.17 MIL/uL (ref 3.87–5.11)
RDW: 14.3 % (ref 11.5–15.5)
RDW: 14 % (ref 11.5–15.5)
WBC: 11.8 10*3/uL — ABNORMAL HIGH (ref 4.0–10.5)
WBC: 19.4 10*3/uL — ABNORMAL HIGH (ref 4.0–10.5)
nRBC: 0 % (ref 0.0–0.2)
nRBC: 0 % (ref 0.0–0.2)

## 2021-04-16 LAB — GLUCOSE, CAPILLARY
Glucose-Capillary: 164 mg/dL — ABNORMAL HIGH (ref 70–99)
Glucose-Capillary: 93 mg/dL (ref 70–99)
Glucose-Capillary: 120 mg/dL — ABNORMAL HIGH (ref 70–99)
Glucose-Capillary: 95 mg/dL (ref 70–99)
Glucose-Capillary: 103 mg/dL — ABNORMAL HIGH (ref 70–99)

## 2021-04-16 LAB — PROTEIN / CREATININE RATIO, URINE
Creatinine, Urine: 225.92 mg/dL
Protein Creatinine Ratio: 0.07 mg/mg{Cre} (ref 0.00–0.15)
Total Protein, Urine: 16 mg/dL

## 2021-04-16 LAB — RESP PANEL BY RT-PCR (FLU A&B, COVID) ARPGX2
Influenza A by PCR: NEGATIVE
Influenza B by PCR: NEGATIVE
SARS Coronavirus 2 by RT PCR: NEGATIVE

## 2021-04-16 LAB — RPR: RPR Ser Ql: NONREACTIVE

## 2021-04-16 MED ORDER — LIDOCAINE HCL (PF) 1 % IJ SOLN: 30.0000 mL | INTRAMUSCULAR | Status: DC | PRN

## 2021-04-16 MED ORDER — OXYTOCIN-SODIUM CHLORIDE 30-0.9 UT/500ML-% IV SOLN
2.5000 [IU]/h | INTRAVENOUS | Status: DC
  Administered 2021-04-16: 2.5 [IU]/h via INTRAVENOUS
  Filled 2021-04-16 (×2): qty 500

## 2021-04-16 MED ORDER — FENTANYL-BUPIVACAINE-NACL 0.5-0.125-0.9 MG/250ML-% EP SOLN
12.0000 mL/h | EPIDURAL | Status: DC | PRN
  Filled 2021-04-16: qty 250

## 2021-04-16 MED ORDER — DIPHENHYDRAMINE HCL 50 MG/ML IJ SOLN: 12.5000 mg | INTRAMUSCULAR | Status: DC | PRN

## 2021-04-16 MED ORDER — INSULIN ASPART 100 UNIT/ML IJ SOLN: 0.0000 [IU] | Freq: Three times a day (TID) | INTRAMUSCULAR | Status: DC

## 2021-04-16 MED ORDER — INSULIN ASPART 100 UNIT/ML IJ SOLN: 0.0000 [IU] | Freq: Every day | INTRAMUSCULAR | Status: DC

## 2021-04-16 MED ORDER — TERBUTALINE SULFATE 1 MG/ML IJ SOLN: 0.2500 mg | Freq: Once | INTRAMUSCULAR | Status: DC | PRN

## 2021-04-16 MED ORDER — MISOPROSTOL 25 MCG QUARTER TABLET
ORAL_TABLET | ORAL | Status: AC
  Filled 2021-04-16: qty 1

## 2021-04-16 MED ORDER — ONDANSETRON HCL 4 MG/2ML IJ SOLN
4.0000 mg | Freq: Four times a day (QID) | INTRAMUSCULAR | Status: DC | PRN
  Administered 2021-04-16: 4 mg via INTRAVENOUS
  Filled 2021-04-16: qty 2

## 2021-04-16 MED ORDER — PRENATAL MULTIVITAMIN CH
1.0000 | ORAL_TABLET | Freq: Every day | ORAL | Status: DC
  Administered 2021-04-17 – 2021-04-18 (×2): 1 via ORAL
  Filled 2021-04-16 (×2): qty 1

## 2021-04-16 MED ORDER — FENTANYL CITRATE (PF) 100 MCG/2ML IJ SOLN
100.0000 ug | INTRAMUSCULAR | Status: DC | PRN
  Administered 2021-04-16 (×4): 100 ug via INTRAVENOUS
  Filled 2021-04-16 (×4): qty 2

## 2021-04-16 MED ORDER — LACTATED RINGERS IV SOLN: INTRAVENOUS | Status: DC

## 2021-04-16 MED ORDER — OXYTOCIN BOLUS FROM INFUSION
333.0000 mL | Freq: Once | INTRAVENOUS | Status: AC
  Administered 2021-04-16: 333 mL via INTRAVENOUS

## 2021-04-16 MED ORDER — MISOPROSTOL 50MCG HALF TABLET
50.0000 ug | ORAL_TABLET | ORAL | Status: DC
  Administered 2021-04-16 (×2): 50 ug via ORAL
  Filled 2021-04-16 (×2): qty 1

## 2021-04-16 MED ORDER — COCONUT OIL OIL
1.0000 "application " | TOPICAL_OIL | Status: DC | PRN
  Administered 2021-04-17: 1 via TOPICAL

## 2021-04-16 MED ORDER — SOD CITRATE-CITRIC ACID 500-334 MG/5ML PO SOLN: 30.0000 mL | ORAL | Status: DC | PRN

## 2021-04-16 MED ORDER — MEASLES, MUMPS & RUBELLA VAC IJ SOLR
0.5000 mL | Freq: Once | INTRAMUSCULAR | Status: AC
  Administered 2021-04-18: 0.5 mL via SUBCUTANEOUS
  Filled 2021-04-16: qty 0.5

## 2021-04-16 MED ORDER — DIBUCAINE (PERIANAL) 1 % EX OINT
1.0000 | TOPICAL_OINTMENT | CUTANEOUS | Status: DC | PRN
Start: 2021-04-16 — End: 2021-04-19

## 2021-04-16 MED ORDER — BENZOCAINE-MENTHOL 20-0.5 % EX AERO
1.0000 "application " | INHALATION_SPRAY | CUTANEOUS | Status: DC | PRN
  Administered 2021-04-16: 1 via TOPICAL
  Filled 2021-04-16: qty 56

## 2021-04-16 MED ORDER — FENTANYL CITRATE (PF) 100 MCG/2ML IJ SOLN: 50.0000 ug | INTRAMUSCULAR | Status: DC | PRN

## 2021-04-16 MED ORDER — GLYBURIDE 5 MG PO TABS
5.0000 mg | ORAL_TABLET | Freq: Every day | ORAL | Status: DC
  Filled 2021-04-16: qty 1

## 2021-04-16 MED ORDER — METFORMIN HCL 500 MG PO TABS
1000.0000 mg | ORAL_TABLET | Freq: Two times a day (BID) | ORAL | Status: DC
  Administered 2021-04-16: 1000 mg via ORAL
  Filled 2021-04-16 (×3): qty 2

## 2021-04-16 MED ORDER — TETANUS-DIPHTH-ACELL PERTUSSIS 5-2.5-18.5 LF-MCG/0.5 IM SUSY: 0.5000 mL | PREFILLED_SYRINGE | Freq: Once | INTRAMUSCULAR | Status: DC

## 2021-04-16 MED ORDER — SERTRALINE HCL 25 MG PO TABS
25.0000 mg | ORAL_TABLET | Freq: Every day | ORAL | Status: DC
  Administered 2021-04-16: 25 mg via ORAL
  Filled 2021-04-16: qty 1

## 2021-04-16 MED ORDER — DIPHENHYDRAMINE HCL 25 MG PO CAPS: 25.0000 mg | ORAL_CAPSULE | Freq: Four times a day (QID) | ORAL | Status: DC | PRN

## 2021-04-16 MED ORDER — SENNOSIDES-DOCUSATE SODIUM 8.6-50 MG PO TABS
2.0000 | ORAL_TABLET | ORAL | Status: DC
  Administered 2021-04-17 – 2021-04-18 (×2): 2 via ORAL
  Filled 2021-04-16 (×2): qty 2

## 2021-04-16 MED ORDER — MISOPROSTOL 25 MCG QUARTER TABLET
25.0000 ug | ORAL_TABLET | ORAL | Status: DC | PRN
  Administered 2021-04-16: 25 ug via VAGINAL
  Filled 2021-04-16: qty 1

## 2021-04-16 MED ORDER — IBUPROFEN 600 MG PO TABS
600.0000 mg | ORAL_TABLET | Freq: Four times a day (QID) | ORAL | Status: DC
  Administered 2021-04-17 – 2021-04-18 (×8): 600 mg via ORAL
  Filled 2021-04-16 (×8): qty 1

## 2021-04-16 MED ORDER — OXYCODONE-ACETAMINOPHEN 5-325 MG PO TABS: 2.0000 | ORAL_TABLET | ORAL | Status: DC | PRN

## 2021-04-16 MED ORDER — ONDANSETRON HCL 4 MG PO TABS: 4.0000 mg | ORAL_TABLET | ORAL | Status: DC | PRN

## 2021-04-16 MED ORDER — PHENYLEPHRINE 40 MCG/ML (10ML) SYRINGE FOR IV PUSH (FOR BLOOD PRESSURE SUPPORT): 80.0000 ug | PREFILLED_SYRINGE | INTRAVENOUS | Status: DC | PRN

## 2021-04-16 MED ORDER — EPHEDRINE 5 MG/ML INJ: 10.0000 mg | INTRAVENOUS | Status: DC | PRN

## 2021-04-16 MED ORDER — LACTATED RINGERS IV SOLN: 500.0000 mL | Freq: Once | INTRAVENOUS | Status: DC

## 2021-04-16 MED ORDER — WITCH HAZEL-GLYCERIN EX PADS: 1.0000 "application " | MEDICATED_PAD | CUTANEOUS | Status: DC | PRN

## 2021-04-16 MED ORDER — ACETAMINOPHEN 325 MG PO TABS
650.0000 mg | ORAL_TABLET | ORAL | Status: DC | PRN
  Administered 2021-04-17 (×2): 650 mg via ORAL
  Filled 2021-04-16 (×2): qty 2

## 2021-04-16 MED ORDER — SIMETHICONE 80 MG PO CHEW: 80.0000 mg | CHEWABLE_TABLET | ORAL | Status: DC | PRN

## 2021-04-16 MED ORDER — ONDANSETRON HCL 4 MG/2ML IJ SOLN: 4.0000 mg | INTRAMUSCULAR | Status: DC | PRN

## 2021-04-16 MED ORDER — ACETAMINOPHEN 325 MG PO TABS: 650.0000 mg | ORAL_TABLET | ORAL | Status: DC | PRN

## 2021-04-16 MED ORDER — LACTATED RINGERS IV SOLN: 500.0000 mL | INTRAVENOUS | Status: DC | PRN

## 2021-04-16 MED ORDER — OXYCODONE-ACETAMINOPHEN 5-325 MG PO TABS: 1.0000 | ORAL_TABLET | ORAL | Status: DC | PRN

## 2021-04-16 MED ORDER — OXYTOCIN-SODIUM CHLORIDE 30-0.9 UT/500ML-% IV SOLN
1.0000 m[IU]/min | INTRAVENOUS | Status: DC
  Administered 2021-04-16: 2 m[IU]/min via INTRAVENOUS

## 2021-04-16 NOTE — Lactation Note (Signed)
This note was copied from a baby's chart. Lactation Consultation Note  Patient Name: Veronica Hendrix Date: 04/16/2021 Reason for consult: Early term 37-38.6wks;L&D Initial assessment;Maternal endocrine disorder Age:34 hours  Visited with mom of 1 hour old ETI female, she's a P3 and has some experience BF, but it's been mostly pumping and bottle feeding due to tongue tied on her previous baby. Offered assistance with hand expression ( 4 ml of EBM) and latch but mom told LC that L&D staff recommended to hold on the latch until baby stops grunting.   LC tried to finger feed some EBM but baby barely sucked, he only took a few drops. Reviewed normal newborn behavior, feeding cues, newborn hypoglycemia, STS care and size of baby's stomach. Mom has plenty of colostrum and wishes to be set up with a DEBP once she gets to her MBU room in case baby needs to be supplemented.  Feeding plan:  Encouraged mom to feed baby STS 8-12 times/24 hours or sooner if feeding cues are present Hand expression and spoon feeding were also strongly encouraged Parents will try to do as much STS care as they can  FOB and GOB present. Family reported all questions and concerns were answered, they're both aware of LC OP services and will call PRN.  Maternal Data Has patient been taught Hand Expression?: Yes Does the patient have breastfeeding experience prior to this delivery?: Yes How long did the patient breastfeed?: 1st one for 2 weeks; 2nd one for 9 months  Feeding Mother's Current Feeding Choice: Breast Milk   Lactation Tools Discussed/Used    Interventions Interventions: Breast feeding basics reviewed;Education;Breast massage;Hand express;Skin to skin  Discharge Pump: Personal (DEBP at home) [facility] Program: Yes  Consult Status Consult Status: Follow-up Date: 04/17/21 Follow-up type: In-patient    Veronica Hendrix 04/16/2021, 9:12 PM

## 2021-04-16 NOTE — Progress Notes (Signed)
Patient ID: [name], female   DOB: 20-May-1986, 35 y.o.   MRN: [id number]  S/p cytotec x 2 doses (first vag, second buccal); not feeling much cramping; had glucose on admission CMET of 135  BP 143/91, P 97 FHR 120s, +accels, no decels, Cat 1 Uterine irritability Cx 1/60/vtx -2  CBG @ 0700 after Twizzlers: 164  IUP@ 37.1wks gHTN (neg labs) GDMA2 Cx unfavorable  -Discussion re preference for next IOL step at 0900> she would like a cytotec and cervical foley placement -Needs Endotool vs SSI protocol> will discuss in rounds  Veronica Hendrix [facility] 04/16/2021 8:02 AM

## 2021-04-16 NOTE — Progress Notes (Addendum)
Patient ID: Veronica Hendrix, female   DOB: 1986-05-12, 35 y.o.   MRN: [id number]  Labor Progress Note [name] is a 35 y.o. G3P1102 at [redacted]w[redacted]d presented for IOL due to gHTN.  S: Patient reports that she has been feeling more painful contractions every few minutes and inquires about whether she can receive additional pain medication. She denies epidural at this time.   O:  BP (!) 135/91   Pulse (!) 105   Temp 98 F (36.7 C) (Axillary)   Resp 16   Ht 5\' 5"  (1.651 m)   Wt 110.7 kg   LMP 07/30/2020 (Exact Date)   SpO2 100%   BMI 40.60 kg/m  EFM: baseline 130 bpm / moderate variability / + accels / no decels Toco: difficult to trace  CVE: Dilation: 6 Effacement (%): 90 Cervical Position: Middle (right) Station: -2 Presentation: Vertex Exam by:: Dr [email or web address]   A&P: 35 y.o. 32 [redacted]w[redacted]d IOL due to gHTN #IOL: S/P Cytotec x3. Pit has been titrated up to 6 milli-units/minute as of 1707. AROM at 1807  #Pain: Per patient request, not planning epidural.   #FWB: Category 1 strip #GBS negative #gHTN: intermittent mid range blood pressures. Pre-eclampsia labs were unremarkable. Asymptomatic.  #A2GDM: Most recent capillary BG is 103, will continue to monitor q2h while in active labor. Patient has SSI ordered for 3 three times a day with meals and qhs.  #Depression: Patient currently on Zoloft 25 mg daily.  Miranda [redacted]w[redacted]d, Student-PA 6:16 PM   GME ATTESTATION:  I saw and evaluated the patient. I agree with the findings and the plan of care as documented in the PA student's note.  Rosalia Hammers, MD OB Fellow, Faculty [facility], Center for [facility] 04/16/2021 7:21 PM

## 2021-04-16 NOTE — Progress Notes (Signed)
RN adjusting u/s 5-7 minutes .Pt in knee chest . Pt states uc's feel better in this position Pt does not want rx for pain

## 2021-04-16 NOTE — Progress Notes (Addendum)
Labor Progress Note Veronica Hendrix is a 35 y.o. P2Z3007 at [redacted]w[redacted]d presented for IOL-gHTN S: Starting to feel more painful contractions every few minutes.  O:  BP 131/89 (BP Location: Left Arm)   Pulse 90   Temp 97.8 F (36.6 C) (Oral)   Resp 16   Ht 5\' 5"  (1.651 m)   Wt 110.7 kg   LMP 07/30/2020 (Exact Date)   SpO2 100%   BMI 40.60 kg/m  EFM: baseline 125/moderate variability/+accels/intermittent variable decels Toco: every 1-3 min  CVE: Dilation: 1.5 Effacement (%): 60 Cervical Position: Middle (right) Station: -2 Presentation: Vertex Exam by:: Dr [email or web address]   A&P: 35 y.o. 33 [redacted]w[redacted]d IOL-gHTN #IOL: S/p Cytotec x3, FB still in place. Contracting too frequently for Cytotec. Can consider starting Pitocin when contractions space out. #Pain: per patient request, not planning epidural #FWB: category II given occasional intermittent variables, overall reassuring with accels and moderate variability #GBS negative #gHTN: intermittent mild range BP. PEC labs unremarkable. #A2GDM: most recent CBG 93, monitor CBG q4h latent labor, q2h active labor. Moderate SSI TIDAC and qhs.  [redacted]w[redacted]d, MD 1:17 PM   Attestation of Supervision of Resident:  I confirm that I have verified the information documented in the resident's note and that I have also personally performed the history, physical exam and all medical decision making activities.  I have verified that all services and findings are accurately documented in this note; and I agree with management and plan as outlined in the documentation. I have also made any necessary editorial changes.  FB out at 1330- SVE 3/70/-2, will start pitocin and titrate to regular contraction pattern.  Littie Deeds, MD Center for [facility], [facility] 04/16/2021 4:14 PM

## 2021-04-16 NOTE — Plan of Care (Signed)
  Problem: Education: Goal: Individualized Educational Video(s) Outcome: Progressing   Problem: Education: Goal: Knowledge of General Education information will improve Description: Including pain rating scale, medication(s)/side effects and non-pharmacologic comfort measures Outcome: Progressing   Problem: Health Behavior/Discharge Planning: Goal: Ability to manage health-related needs will improve Outcome: Progressing   Problem: Clinical Measurements: Goal: Ability to maintain clinical measurements within normal limits will improve Outcome: Progressing Goal: Will remain free from infection Outcome: Progressing Goal: Diagnostic test results will improve Outcome: Progressing Goal: Respiratory complications will improve Outcome: Progressing Goal: Cardiovascular complication will be avoided Outcome: Progressing   Problem: Activity: Goal: Risk for activity intolerance will decrease Outcome: Progressing   Problem: Nutrition: Goal: Adequate nutrition will be maintained Outcome: Progressing   Problem: Coping: Goal: Level of anxiety will decrease Outcome: Progressing   Problem: Elimination: Goal: Will not experience complications related to bowel motility Outcome: Progressing Goal: Will not experience complications related to urinary retention Outcome: Progressing   Problem: Pain Managment: Goal: General experience of comfort will improve Outcome: Progressing   Problem: Safety: Goal: Ability to remain free from injury will improve Outcome: Progressing   Problem: Skin Integrity: Goal: Risk for impaired skin integrity will decrease Outcome: Progressing   Problem: Education: Goal: Knowledge of disease or condition will improve Outcome: Progressing Goal: Knowledge of the prescribed therapeutic regimen will improve Outcome: Progressing   Problem: Fluid Volume: Goal: Peripheral tissue perfusion will improve Outcome: Progressing   Problem: Clinical  Measurements: Goal: Complications related to disease process, condition or treatment will be avoided or minimized Outcome: Progressing   Problem: Education: Goal: Knowledge of Childbirth will improve Outcome: Completed/Met Goal: Ability to make informed decisions regarding treatment and plan of care will improve Outcome: Completed/Met Goal: Ability to state and carry out methods to decrease the pain will improve Outcome: Completed/Met   Problem: Coping: Goal: Ability to verbalize concerns and feelings about labor and delivery will improve Outcome: Completed/Met   Problem: Life Cycle: Goal: Ability to make normal progression through stages of labor will improve Outcome: Completed/Met Goal: Ability to effectively push during vaginal delivery will improve Outcome: Completed/Met   Problem: Role Relationship: Goal: Will demonstrate positive interactions with the child Outcome: Completed/Met   Problem: Safety: Goal: Risk of complications during labor and delivery will decrease Outcome: Completed/Met   Problem: Pain Management: Goal: Relief or control of pain from uterine contractions will improve Outcome: Completed/Met

## 2021-04-16 NOTE — Progress Notes (Signed)
Inpatient Diabetes Program Recommendations  Diabetes Treatment Program Recommendations  ADA Standards of Care 2021 Diabetes in Pregnancy Target Glucose Ranges:  Fasting: 60 - 90 mg/dL Preprandial: 60 - 938 mg/dL 1 hr postprandial: Less than 140mg /dL (from first bite of meal) 2 hr postprandial: Less than 120 mg/dL (from first bite of meal)     Lab Results  Component Value Date   GLUCAP 164 (H) 04/16/2021   HGBA1C 5.2 11/02/2020    Review of Glycemic Control Results for Veronica Hendrix, Veronica Hendrix (MRN [place] [facility]are) as of 04/16/2021 09:13  Ref. Range 04/16/2021 06:56  Glucose-Capillary Latest Ref Range: 70 - 99 mg/dL 04/18/2021 (H)   Diabetes history: GDM Outpatient Diabetes medications: Glyburide 5 mg QHS, Metformin 1000 mg BID Current orders for Inpatient glycemic control: Novolog 0-15 units TID & HS, Glyburide 5 mg QHS, Metformin 1000 mg BID  Inpatient Diabetes Program Recommendations:    Consider adding IV insulin per Endotool (under Diabetes in Pregnancy order set) for CBGs consecutively >120 mg/dL.   Thanks, 967, MSN, RNC-OB Diabetes Coordinator [phone] (8a-5p)

## 2021-04-16 NOTE — Progress Notes (Signed)
Labor Progress Note Veronica Hendrix is a 35 y.o. R1H6579 at [redacted]w[redacted]d presented for IOL-gHTN S: No concerns at this time. Discussed FB placement, patient amenable.  O:  BP 128/79   Pulse 93   Temp 98.2 F (36.8 C)   Resp 20   Ht 5\' 5"  (1.651 m)   Wt 110.7 kg   LMP 07/30/2020 (Exact Date)   SpO2 100%   BMI 40.60 kg/m  EFM: baseline 130/moderate variability/+accels/no decels Toco: occasional  CVE: Dilation: 1.5 Effacement (%): 60 Cervical Position: Middle (right) Station: -2 Presentation: Vertex Exam by:: Dr [email or web address]   A&P: 35 y.o. 32 [redacted]w[redacted]d IOL-gHTN #IOL: Progressing well. FB placed and given a 3rd dose of Cytotec. #Pain: per patient request #FWB: cat I #GBS negative #gHTN: intermittent mild range BP, recently normotensive. PEC labs unremarkable. #A2GDM: most recent CBG 164 (after eating Twizzlers), monitor CBG q4h latent labor, q2h active labor. Started on moderate SSI TIDAC and qhs.  [redacted]w[redacted]d, MD 9:17 AM

## 2021-04-16 NOTE — Discharge Summary (Signed)
Postpartum Discharge Summary  Date of Service updated 04/18/21     Patient Name: Veronica Hendrix DOB: [birth year] MRN: [id number]  Date of admission: 04/16/2021 Delivery date:04/16/2021  Delivering provider: Aletha Halim  Date of discharge: 04/18/2021  Admitting diagnosis: Gestational hypertension [O13.9] Intrauterine pregnancy: [redacted]w[redacted]d    Secondary diagnosis:  Active Problems:   Moderate episode of recurrent major depressive disorder (HCC)   History of pre-eclampsia in prior pregnancy, currently pregnant   Rubella non-immune status, antepartum   Gestational diabetes mellitus, class A2   Gestational hypertension  Additional problems: PPHTN    Discharge diagnosis: Term Pregnancy Delivered                                              Post partum procedures: MMR ordered to be given for Rub NI Augmentation: AROM, Pitocin, Cytotec, and IP Foley Complications: None  Hospital course: Induction of Labor With Vaginal Delivery   35y.o. yo [id number] admitted to the hospital 04/16/2021 for induction of labor.  Indication for induction: Gestational hypertension and A2 DM.  Patient had an uncomplicated labor course as follows: Membrane Rupture Time/Date: 6:07 PM ,04/16/2021   Delivery Method:Vaginal, Spontaneous  Episiotomy: None  Lacerations:  1st degree  Details of delivery can be found in separate delivery note.  BP elevated on 6/29 AM- procardia 30XL started. Denies ha, visual changes, ruq/epigastric pain, n/v.  Patient is discharged home 04/18/21 later today after repeat bp check s/p meds. BabyRx HTN protocol and meds to beds initiated.   Newborn Data: Birth date:04/16/2021  Birth time:7:58 PM  Gender:Female  Living status:Living  Apgars:3 ,6  Weight:3105 g   Magnesium Sulfate received: No BMZ received: No Rhophylac:N/A MMR:N/A T-DaP:Given prenatally Flu: No Transfusion:No  Physical exam  Vitals:   04/17/21 0757 04/17/21 1335 04/17/21 2029 04/18/21 0513  BP:  122/82 128/74 137/88 (!) 134/92  Pulse: 79 80 84 76  Resp: 16 18 17 18   Temp:  98.4 F (36.9 C) 98 F (36.7 C) 98.1 F (36.7 C)  TempSrc:  Oral Oral Oral  SpO2: 98% 99% 99% 99%  Weight:      Height:       General: alert, cooperative, and no distress Lochia: appropriate Uterine Fundus: firm Incision: N/A DVT Evaluation: No evidence of DVT seen on physical exam. Negative Homan's sign. No cords or calf tenderness. No significant calf/ankle edema. Labs: Lab Results  Component Value Date   WBC 19.4 (H) 04/16/2021   HGB 12.2 04/16/2021   HCT 35.9 (L) 04/16/2021   MCV 86.1 04/16/2021   PLT 336 04/16/2021   CMP Latest Ref Rng & Units 04/16/2021  Glucose 70 - 99 mg/dL 135(H)  BUN 6 - 20 mg/dL 9  Creatinine 0.44 - 1.00 mg/dL 0.73  Sodium 135 - 145 mmol/L 135  Potassium 3.5 - 5.1 mmol/L 3.7  Chloride 98 - 111 mmol/L 106  CO2 22 - 32 mmol/L 22  Calcium 8.9 - 10.3 mg/dL 8.9  Total Protein 6.5 - 8.1 g/dL 6.0(L)  Total Bilirubin 0.3 - 1.2 mg/dL 0.5  Alkaline Phos 38 - 126 U/L 187(H)  AST 15 - 41 U/L 19  ALT 0 - 44 U/L 17   Edinburgh Score: Edinburgh Postnatal Depression Scale Screening Tool 04/17/2021  I have been able to laugh and see the funny side of things. 0  I  have looked forward with enjoyment to things. 1  I have blamed myself unnecessarily when things went wrong. 3  I have been anxious or worried for no good reason. 2  I have felt scared or panicky for no good reason. 2  Things have been getting on top of me. 2  I have been so unhappy that I have had difficulty sleeping. 2  I have felt sad or miserable. 2  I have been so unhappy that I have been crying. 1  The thought of harming myself has occurred to me. 0  Edinburgh Postnatal Depression Scale Total 15     After visit meds:  Allergies as of 04/18/2021   No Known Allergies      Medication List     STOP taking these medications    Accu-Chek Guide Me w/Device Kit   Accu-Chek Guide test strip Generic  drug: glucose blood   Accu-Chek Softclix Lancets lancets   acetaminophen 500 MG tablet Commonly known as: TYLENOL   aspirin EC 81 MG tablet   Blood Pressure Monitor Misc   glyBURIDE 5 MG tablet Commonly known as: DIABETA   metFORMIN 1000 MG tablet Commonly known as: Glucophage       TAKE these medications    ibuprofen 600 MG tablet Commonly known as: ADVIL Take 1 tablet (600 mg total) by mouth every 6 (six) hours as needed for mild pain or moderate pain.   loratadine 10 MG tablet Commonly known as: CLARITIN Take 10 mg by mouth daily.   NIFEdipine 30 MG 24 hr tablet Commonly known as: ADALAT CC Take 1 tablet (30 mg total) by mouth daily. Start taking on: April 19, 2021   PRENATAL VITAMIN PO Take 1 tablet by mouth daily.   sertraline 25 MG tablet Commonly known as: Zoloft Take 1 tablet (25 mg total) by mouth daily.         Discharge home in stable condition Infant Feeding: Breast Infant Disposition:home with mother Discharge instruction: per After Visit Summary and Postpartum booklet. Activity: Advance as tolerated. Pelvic rest for 6 weeks.  Diet: routine diet Future Appointments: Future Appointments  Date Time Provider [place]  04/26/2021  1:50 PM Christin Fudge, CNM [facility]  05/22/2021  1:30 PM Roma Schanz, CNM [facility]   Follow up Visit:  Follow-up Information     [facility] Follow up.   Specialty: Obstetrics and Gynecology Why: as scheduled Contact information: [address] [place] [phone]                 Please schedule this patient for a In person postpartum visit in 4 weeks with the following provider: Any provider. Additional Postpartum F/U:Incision check 1 week  Low risk pregnancy complicated by: GDM and HTN Delivery mode:  Vaginal, Spontaneous  Anticipated Birth Control:  Unsure   04/18/2021 Roma Schanz, CNM

## 2021-04-16 NOTE — H&P (Signed)
Veronica Hendrix is a 35 y.o. G29P1102 female at 77w1dby LMP c/w 8wk scan presenting for IOL due to gHTN; also with GDMA2 and resolved polyhydramnios (27cm @ 31wks). Denies H/A, visual disturbances, or RUQ pain. Reports active fetal movement, contractions: irreg & mild, vaginal bleeding: none, membranes: intact.  Initiated prenatal care at [facility] at 8 wks.   Most recent u/s: 36.2wks, EFW 44th%, AFI 17.5cm, BPP 8/8, ant placenta.   This pregnancy complicated by: # gHTN # hx of severe pre-e with prev pregnancy # GDMA2 (MTF 5062mq AM, 100050m PM, glyburide 5mg20mhs) # anxiety/depression (Zoloft 25mg56mrubella nonimmune # BMI 40  Prenatal History/Complications:  # severe pre-e and GDM with 36wk IOL> 2nd preg  Past Medical History: Past Medical History:  Diagnosis Date   Bipolar 1 disorder (HCC) [place]    Headache    History of pre-eclampsia in prior pregnancy, currently pregnant    HPV in female    Panic attacks    Post traumatic stress disorder (PTSD)    Vision abnormalities     Past Surgical History: Past Surgical History:  Procedure Laterality Date   BLOOD PATCH      Obstetrical History: OB History     Gravida  3   Para  2   Term  1   Preterm  1   AB  0   Living  2      SAB      IAB      Ectopic      Multiple      Live Births  2           Social History: Social History   Socioeconomic History   Marital status: Married    Spouse name: Not on file   Number of children: 2   Years of education: Not on file   Highest education level: Not on file  Occupational History   Not on file  Tobacco Use   Smoking status: Never   Smokeless tobacco: Never  Vaping Use   Vaping Use: Never used  Substance and Sexual Activity   Alcohol use: No   Drug use: No   Sexual activity: Yes    Birth control/protection: None  Other Topics Concern   Not on file  Social History Narrative   Not on file   Social Determinants of Health    Financial Resource Strain: High Risk   Difficulty of Paying Living Expenses: Hard  Food Insecurity: Food Insecurity Present   Worried About Running Out of Food in the Last Year: Sometimes true   Ran Out of Food in the Last Year: Sometimes true  Transportation Needs: No Transportation Needs   Lack of Transportation (Medical): No   Lack of Transportation (Non-Medical): No  Physical Activity: Insufficiently Active   Days of Exercise per Week: 2 days   Minutes of Exercise per Session: 30 min  Stress: Stress Concern Present   Feeling of Stress : Rather much  Social Connections: Moderately Isolated   Frequency of Communication with Friends and Family: Once a week   Frequency of Social Gatherings with Friends and Family: Never   Attends Religious Services: 1 to 4 times per year   Active Member of [employer]: No   Attends Club Archivistings: Never   Marital Status: Married    Family History: Family History  Problem Relation Age of Onset   Diabetes Mother    Depression Mother  Mental illness Father    Hypertension Father    Depression Father    Anxiety disorder Father    Bipolar disorder Father    Heart disease Maternal Grandmother    Heart disease Maternal Grandfather    Heart disease Paternal Grandfather    Hydrocephalus Son     Allergies: No Known Allergies  Medications Prior to Admission  Medication Sig Dispense Refill Last Dose   Accu-Chek Softclix Lancets lancets Use as instructed to check blood sugar 4 times daily 100 each 12    acetaminophen (TYLENOL) 500 MG tablet Take 1,000 mg by mouth every 6 (six) hours as needed for moderate pain. (Patient not taking: No sig reported)      aspirin EC 81 MG tablet Take 2 tablets (162 mg total) by mouth daily. 60 tablet 6    Blood Glucose Monitoring Suppl (ACCU-CHEK GUIDE ME) w/Device KIT 1 each by Does not apply route 4 (four) times daily. 1 kit 0    Blood Pressure Monitor MISC For regular home bp  monitoring during pregnancy 1 each 0    glucose blood (ACCU-CHEK GUIDE) test strip Use as instructed to check blood sugar 4 times daily 50 each 12    glyBURIDE (DIABETA) 5 MG tablet Take 1 tablet (5 mg total) by mouth at bedtime. 30 tablet 1    loratadine (CLARITIN) 10 MG tablet Take 10 mg by mouth daily.      metFORMIN (GLUCOPHAGE) 1000 MG tablet Take 1 tablet (1,000 mg total) by mouth 2 (two) times daily with a meal. 60 tablet 3    Prenatal Vit-Fe Fumarate-FA (PRENATAL VITAMIN PO) Take 1 tablet by mouth daily.      sertraline (ZOLOFT) 25 MG tablet Take 1 tablet (25 mg total) by mouth daily. 90 tablet 3     Review of Systems  Pertinent pos/neg as indicated in HPI  Blood pressure (!) 143/91, pulse 97, temperature 98.5 F (36.9 C), temperature source Oral, resp. rate 12, height 5' 5" (1.651 m), weight 110.7 kg, last menstrual period 07/30/2020, SpO2 100 %. General appearance: alert and cooperative Lungs: clear to auscultation bilaterally Heart: regular rate and rhythm Abdomen: gravid, soft, non-tender, EFW by Leopold's approximately 7lbs Extremities: tr edema DTR's nl  Fetal monitoring: FHR: 120s bpm, variability: moderate,  Accelerations: Present,  decelerations:  Absent Uterine activity: irreg, mild   Presentation: cephalic   Prenatal labs: ABO, Rh: O/Positive/-- (01/13 1523) Antibody: Negative (05/02 0839) Rubella: <0.90 (01/13 1523) RPR: Non Reactive (05/02 0839)  HBsAg: Negative (01/13 1523)  HIV: Non Reactive (05/02 0839)  GBS: Negative/-- (06/21 1400)  2hr GTT: 105/183/102  Prenatal Transfer Tool  Maternal Diabetes: Yes:  Diabetes Type:  Insulin/Medication controlled Genetic Screening: Normal Maternal Ultrasounds/Referrals: Normal Fetal Ultrasounds or other Referrals:  None Maternal Substance Abuse:  No Significant Maternal Medications:  Meds include: Other: Metformin, glyburide, Zoloft Significant Maternal Lab Results: Group B Strep negative  No results found for  this or any previous visit (from the past 24 hour(s)).   Assessment:  [redacted]w[redacted]d SIUP  G3P1102  gHTN  GDMA2  Cat 1 FHR  GBS Negative/-- (06/21 1400)  Plan:  -Admit to L&D  -IV pain meds/epidural prn active labor -Plan for cervical ripening with cytotec/cervical foley to start, and then Pit/AROM prn -Continue home Metformin/glyburide during cervical ripening phase -CBGs q 4h in latent labor and then q 2h in active -Collect pre-e labs and monitor BPs/symptoms  -Anticipate vag del   -Plans to breastfeed  -Recommend MMR postpartum  Contraception:   unsure  Circumcision: yes  Myrtis Ser CNM 04/16/2021, 12:53 AM

## 2021-04-17 ENCOUNTER — Other Ambulatory Visit: Payer: Medicaid Other | Admitting: Women's Health

## 2021-04-17 LAB — TYPE AND SCREEN
ABO/RH(D): O POS
Antibody Screen: NEGATIVE

## 2021-04-17 LAB — GLUCOSE, CAPILLARY: Glucose-Capillary: 109 mg/dL — ABNORMAL HIGH (ref 70–99)

## 2021-04-17 MED ORDER — SERTRALINE HCL 25 MG PO TABS
25.0000 mg | ORAL_TABLET | Freq: Every day | ORAL | Status: DC
  Administered 2021-04-17 – 2021-04-18 (×2): 25 mg via ORAL
  Filled 2021-04-17 (×2): qty 1

## 2021-04-17 NOTE — Clinical Social Work Maternal (Signed)
CLINICAL SOCIAL WORK MATERNAL/CHILD NOTE  Patient Details  Name: Veronica Hendrix MRN: [id number] Date of Birth: 01/17/1986  Date:  04/17/2021  Clinical Social Worker Initiating Note:  Maycen Degregory, LCSW Date/Time: Initiated:  04/17/21/0948     Child's Name:  Veronica Hendrix   Biological Parents:  Mother, Father (FOB-Scotty Simmering)   Need for Interpreter:  None   Reason for Referral:  Behavioral Health Concerns   Address:  [address] [place] [address]    Phone number:  [phone] (home)     Additional phone number:   Household Members/Support Persons (HM/SP):   Household Member/Support Person 1, Household Member/Support Person 2, Household Member/Support Person 3   HM/SP Name Relationship DOB or Age  HM/SP -1 Scotty Melland Spouse 09-10-1991  HM/SP -2 Collin William Son 9  HM/SP -3 Mason Aoun Son 16 months  HM/SP -4        HM/SP -5        HM/SP -6        HM/SP -7        HM/SP -8          Natural Supports (not living in the home):  Immediate Family   Professional Supports:     Employment: Unemployed   Type of Work:     Education:  Some College   Homebound arranged:    Financial Resources:      Other Resources:      Cultural/Religious Considerations Which May Impact Care:    Strengths:  Ability to meet basic needs  , Home prepared for child  , Pediatrician chosen   Psychotropic Medications:         Pediatrician:    [place]  Pediatrician List:   [place]    [place]    [place]    [place] [facility]  [place]    [place]      Pediatrician Fax Number:    Risk Factors/Current Problems:  Mental Health Concerns     Cognitive State:  Able to Concentrate  , Alert  , Insightful  , Linear Thinking     Mood/Affect:  Bright  , Comfortable  , Calm     CSW Assessment: CSW received consult for hx of Anxiety, Depression, PTSD, Bipolar I. CSW met with MOB to offer support and  complete assessment.     CSW met with MOB at bedside. CSW observed MOB breastfeeding and boding with the infant. CSW congratulated MOB and introduced role. MOB presented calm and receptive to CSW visit. CSW confirmed MOB demographic information is correct on file. CSW asked about MOB household. MOB reports she lives with her spouse Scotty Davidovich, her sons Collin William and Mason Mc Hendrix (See chart above). CSW inquired how MOB has felt since giving birth. MOB reports that she has been ok, better today. MOB reports this birth was painful compared to the birth of her other children. CSW inquired about MOB support. MOB acknowledges her spouse and mother as supports.   CSW inquired about MOB mental health history of anxiety, depression, PTSD and Bipolar I. MOB acknowledges she has a history of anxiety, depression, PTSD, and Bipolar II disorder with depressive episodes. MOB disclosed she experienced mood swings during the pregnancy. MOB reports she currently takes 25mg of Zoloft that helps to stabilize her mood. MOB reports she experienced postpartum depression after the birth of her 16-month-old son and was prescribed Zoloft then to help with her symptoms. CSW inquired about therapy. MOB   reports she went to two sessions of talk therapy with a counselor at the [facility] however she was too busy to attend additional sessions. CSW inquired about MOB coping skills. MOB reports she distracts herself, for example by watching Tik Tok videos. CSW praised MOB for finding humor in Tik Tok videos. CSW provided education regarding the baby blues period vs. perinatal mood disorders, discussed treatment and gave resources for mental health follow up if concerns arise.  CSW recommended MOB complete a self-evaluation during the postpartum time period using the New Mom Checklist from Postpartum Progress and encouraged MOB to contact a medical professional if symptoms are noted at any time. MOB reports she will reach  out to her OBGYN if concerns arise. CSW assessed MOB for safety. MOB denies thoughts of harming self or others.   CSW provided review of Sudden Infant Death Syndrome (SIDS) precautions and informed MOB no-co sleeping with the infant. MOB reports the infant will sleep in a bassinet. CSW inquired if MOB has essential items for the infant. MOB reports she has essential items for the infant. CSW inquired if MOB receives WIC/FS. MOB reports she received WIC/FS and plans to add the infant to the benefits. MOB has chosen [facility]. CSW inquired if MOB has transportation. MOB reports she has transportation to appointments. CSW assessed MOB for additional needs. MOB reports no further needs.   CSW identifies no further need for intervention and no barriers to discharge at this time.    CSW Plan/Description:  Sudden Infant Death Syndrome (SIDS) Education, Perinatal Mood and Anxiety Disorder (PMADs) Education, No Further Intervention Required/No Barriers to Discharge    Kenyatta Gloeckner A Persis Graffius, LCSW 04/17/2021, 10:31 AM   

## 2021-04-17 NOTE — Progress Notes (Addendum)
Post Partum Day 1 Subjective: no complaints, up ad lib, voiding and tolerating PO, small lochia, plans to breastfeed, Depo-Provera  Objective: Blood pressure 133/80, pulse (!) 102, temperature 98.1 F (36.7 C), temperature source Oral, resp. rate 18, height 5\' 5"  (1.651 m), weight 110.7 kg, last menstrual period 07/30/2020, SpO2 95 %, unknown if currently breastfeeding.  Physical Exam:  General: alert, cooperative and no distress Lochia:normal flow Chest: CTAB Heart: RRR no m/r/g Abdomen: +BS, soft, nontender,  Uterine Fundus: firm DVT Evaluation: No evidence of DVT seen on physical exam. Extremities: trace edema  Recent Labs    04/16/21 0042 04/16/21 1945  HGB 11.0* 12.2  HCT 33.7* 35.9*    Assessment/Plan: Plan for discharge tomorrow, Lactation consult, Circumcision prior to discharge, and Contraception depo (may want to wait until postpartum visit, aware that she can ask for it inpatient).   --continue to monitor BP--discussed possibility of starting an antihypertensive medication if BP meets criteria--amenable to treatment   LOS: 1 day   04/18/21 04/17/2021, 7:46 AM    Circumcision Consent  Discussed with mom at bedside about circumcision.   Circumcision is a surgery that removes the skin that covers the tip of the penis, called the "foreskin." Circumcision is usually done when a boy is between 54 and 33 days old, sometimes up to 41-24 weeks old.  The most common reasons boys are circumcised include for cultural/religious beliefs or for parental preference (potentially easier to clean, so baby looks like daddy, etc).  There may be some medical benefits for circumcision:   Circumcised boys seem to have slightly lower rates of: ? Urinary tract infections (per the American Academy of Pediatrics an uncircumcised boy has a 1/100 chance of developing a UTI in the first year of life, a circumcised boy at a 10/998 chance of developing a UTI in the first year of  life- a 10% reduction) ? Penis cancer (typically rare- an uncircumcised female has a 1 in 100,000 chance of developing cancer of the penis) ? Sexually transmitted infection (in endemic areas, including HIV, HPV and Herpes- circumcision does NOT protect against gonorrhea, chlamydia, trachomatis, or syphilis) ? Phimosis: a condition where that makes retraction of the foreskin over the glans impossible (0.4 per 1000 boys per year or 0.6% of boys are affected by their 15th birthday)  Boys and men who are not circumcised can reduce these extra risks by: ? Cleaning their penis well ? Using condoms during sex  What are the risks of circumcision?  As with any surgical procedure, there are risks and complications. In circumcision, complications are rare and usually minor, the most common being: ? Bleeding- risk is reduced by holding each clamp for 30 seconds prior to a cut being made, and by holding pressure after the procedure is done ? Infection- the penis is cleaned prior to the procedure, and the procedure is done under sterile technique ? Damage to the urethra or amputation of the penis  How is circumcision done in baby boys?  The baby will be placed on a special table and the legs restrained for their safety. Numbing medication is injected into the penis, and the skin is cleansed with betadine to decrease the risk of infection.   What to expect:  The penis will look red and raw for 5-7 days as it heals. We expect scabbing around where the cut was made, as well as clear-pink fluid and some swelling of the penis right after the procedure. If your baby's circumcision starts to  bleed or develops pus, please contact your pediatrician immediately.  All questions were answered and mother consented.  Jacklyn Shell Obstetrics Fellow

## 2021-04-17 NOTE — Lactation Note (Signed)
This note was copied from a baby's chart. Lactation Consultation Note  Patient Name: Veronica Hendrix Date: 04/17/2021 Reason for consult: Follow-up assessment;Mother's request;Difficult latch;Early term 37-38.6wks;Other (Comment) (Hypoglycemia) Age:34 hours  Mom stated infant still having trouble latching given her large nipples. Infant just fed 15 DBM prior to [facility] arrival so I was unable to see a latch.   Mom stated milk supply had declined 5 -10 ml per pumping session now getting 2- 3 ml.  Mom encouraged to do breast massage and hand expression just before latching ensuring colostrum leaking from the breast.  Mom large pendulous breast so we reviewed latching in football sidelined with her breast on a pillow for next feeding. Mom to call [facility] or RN for assistance.   Parents supplemented last feeding with 15 ml of DBM with syringe feeding. LC talked to parents on use of bottle with paced bottle feeding with slow flow nipple for next feeding giving larger supplementation volume.  Parents are aware if infant not latching offer EBM first then [facility] via paced bottle up to 15 ml or more.  Mom using moist heat and breast massage and will be pumping on DEBP q 3 hrs for 15 minutes.   Mom's breast are soft and no signs of plugged duct or areas of hardness. LC assessed flange size as comfortable fit.   Maternal Data Has patient been taught Hand Expression?: Yes  Feeding Mother's Current Feeding Choice: Breast Milk and Donor Milk  LATCH Score                    Lactation Tools Discussed/Used Tools: Pump;Flanges Flange Size: 30 Breast pump type: Double-Electric Breast Pump Reason for Pumping: increase stimulation Pumping frequency: every 3 hrs for  Interventions Interventions: Breast feeding basics reviewed;Breast compression;Skin to skin;Support pillows;DEBP;Breast massage;Position options;Hand express;Expressed milk;Education  Discharge    Consult Status Consult  Status: Follow-up Date: 04/18/21 Follow-up type: In-patient    Veronica Whitcomb  Hendrix 04/17/2021, 11:07 PM

## 2021-04-17 NOTE — Discharge Instructions (Addendum)
NO SEX UNTIL AFTER YOU GET YOUR BIRTH CONTROL ° ° °Call the office ([phone]) [facility] for these signs of pre-eclampsia: °Severe headache that does not go away with Tylenol °Visual changes- seeing spots, double, blurred vision °Pain under your right breast or upper abdomen that does not go away with Tums or heartburn medicine °Nausea and/or vomiting °Severe swelling in your hands, feet, and face  ° ° °

## 2021-04-17 NOTE — Lactation Note (Signed)
This note was copied from a baby's chart. Lactation Consultation Note  Patient Name: Boy Leyli Kevorkian [id number] Date: 04/17/2021 Reason for consult: Initial assessment;Early term 37-38.6wks Age:34 hours   Initial LC Consult:  Attempted to visit with mother, however, she was resting.  Spoke with RN regarding blood sugar levels and offered to return at the next feeding.  Baby has been given colostrum with RN assistance.  Will plan to set up the DEBP when mother calls.  Parents wish to sleep right now.  Baby swaddled and asleep in the bassinet.  RN updated.   Maternal Data    Feeding Mother's Current Feeding Choice: Breast Milk  LATCH Score                    Lactation Tools Discussed/Used    Interventions    Discharge    Consult Status Consult Status: Follow-up Date: 04/17/21 Follow-up type: In-patient    Elisah Parmer R Carlina Derks 04/17/2021, 4:04 AM

## 2021-04-17 NOTE — Lactation Note (Signed)
This note was copied from a baby's chart. Lactation Consultation Note  Patient Name: Veronica Hendrix [id number] Date: 04/17/2021 Reason for consult: Initial assessment;Early term 37-38.6wks Age:34 hours   P3 mother whose infant is now 66 hours old.  This is an ETI at 37+1 weeks.  Mother breast fed her first child ( now 76 years old) for 2 weeks and mostly pumped and bottle fed her second child (now 36 months old) for 9 months.  Mother hopes to breast feed this child for a longer duration.  Baby has had 2 low blood sugar readings: 29 mg/dl and 34 mg/dl.  The last reading was 45 mg/dl at 7517.  Visited with mother and offered to assist with latching.  Suggested mother attempt to feed every 2-3 hours until blood sugar levels are adequate.  Observed mother doing hand expression.  Finger fed drops to baby and latched easily.  Once latched, baby was not interested in sucking.  Asked mother to hand express into the spoon and spoon fed 8 mls to baby.  He was awake and alert; no difficulties with spoon feeding.  Set up the DEBP and reviewed pump parts, assembly and cleaning.  #27 flanges are appropriate at this time.  Mother taught how to observe flange size and may need a #30 in the future.  Suggested she call for latch assistance as needed.  Mom made aware of O/P services, breastfeeding support groups, community resources, and our phone # for post-discharge questions.  Mother has a DEBP for home use.  Support person present and asleep.  RN updated.   Maternal Data Has patient been taught Hand Expression?: Yes Does the patient have breastfeeding experience prior to this delivery?: Yes How long did the patient breastfeed?: 2 weeks with first child and mother pumped and bottle fed her EBM for 9 months with the second child  Feeding Mother's Current Feeding Choice: Breast Milk  LATCH Score Latch: Too sleepy or reluctant, no latch achieved, no sucking elicited.  Audible Swallowing: None  Type of  Nipple: Everted at rest and after stimulation (short shafted)  Comfort (Breast/Nipple): Soft / non-tender  Hold (Positioning): Assistance needed to correctly position infant at breast and maintain latch.  LATCH Score: 5   Lactation Tools Discussed/Used Tools: Pump Breast pump type: Double-Electric Breast Pump;Manual Pump Education: Setup, frequency, and cleaning;Milk Storage Reason for Pumping: Stimulation and supplementation for early term infant Pumping frequency: Every three hours  Interventions Interventions: Breast feeding basics reviewed;Assisted with latch;Skin to skin;Breast massage;Hand express;Pre-pump if needed;Breast compression;Adjust position;DEBP;Hand pump;Expressed milk;Position options;Support pillows;Education  Discharge Pump: DEBP;Manual;Personal  Consult Status Consult Status: Follow-up Date: 04/18/21 Follow-up type: In-patient    Deandrew Hoecker R Audwin Semper 04/17/2021, 6:28 AM

## 2021-04-18 ENCOUNTER — Other Ambulatory Visit (HOSPITAL_COMMUNITY): Payer: Self-pay

## 2021-04-18 LAB — SURGICAL PATHOLOGY

## 2021-04-18 MED ORDER — NIFEDIPINE ER 30 MG PO TB24
30.0000 mg | ORAL_TABLET | Freq: Every day | ORAL | 1 refills | Status: DC
  Filled 2021-04-18: qty 30, 30d supply, fill #0

## 2021-04-18 MED ORDER — NIFEDIPINE ER OSMOTIC RELEASE 30 MG PO TB24
30.0000 mg | ORAL_TABLET | Freq: Every day | ORAL | Status: DC
  Administered 2021-04-18: 30 mg via ORAL
  Filled 2021-04-18 (×2): qty 1

## 2021-04-18 MED ORDER — IBUPROFEN 600 MG PO TABS: 600.0000 mg | ORAL_TABLET | Freq: Four times a day (QID) | ORAL | 0 refills | Status: DC | PRN

## 2021-04-18 NOTE — Lactation Note (Signed)
This note was copied from a baby's chart. Lactation Consultation Note  Patient Name: Veronica Hendrix [id number] Date: 04/18/2021 Reason for consult: Follow-up assessment;Early term 37-38.6wks;Maternal endocrine disorder (Weight loss of -7%, high billi level, ETI infant.) Age:34 hours, infant had 8 voids and 4 stools since birth on billi lights was seen by SPL and infant given 20 mls of donor breast milk with Nfant ( purple and clear nipple) at 1630pm. Per mom, her 40 year old son was Hospitalized for 5 days due to jaundice. Per mom, infant will latch for 8 minutes and then tires. Per mom, she is  very tired and has not slept , mom feels like her supply had decreased, LC explained colostrum is in small amounts first few days of life and very thick.  LC reviewed hand expression and mom easily expressed 3 mls in container, she will offer it first to infant at the next feeding before supplementing infant with donor breast milk. Mom will start using DEBP as previously advised by [facility] services, she will pump every 3 hours for 15 minutes to help stimulate and establish her milk supply. Mom will follow SPL feeding plan to see notes. Mom plans to : 1- Limit total feeding 30 minutes,  keep infant on billi lights. 2- Mom will latch infant at breast every other feeding, and  then supplement infant with any of her EBM first before offering  donor breast milk for every feeding. 3- Mom will used the DEBP every 3 hours for 15 minutes on initial setting and afterwards do hand expression "Hands on pumping". 4- Mom will rest when infant is resting, adequate hydration with meals and snacks.   5- Mom will continue to supplement infant  Day 2 with 20 to 25 mls of EBM/Donor breast milk per feeding due weight loss and elevated billi, after 48 hours of life  ( Day 3) mom increase intake to 20 to 30+ mls per feeding.  6. Mom knows to call RN or LC if she needs assistance with latching infant at the breast.    Maternal  Data Has patient been taught Hand Expression?: Yes  Feeding Mother's Current Feeding Choice: Breast Milk and Formula Nipple Type: Nfant Slow Flow (purple)  LATCH Score                    Lactation Tools Discussed/Used Flange Size: 30  Interventions Interventions: Expressed milk;Coconut oil;DEBP;Education  Discharge    Consult Status Consult Status: Follow-up Date: 04/19/21 Follow-up type: In-patient    Danelle Earthly 04/18/2021, 6:52 PM

## 2021-04-18 NOTE — Progress Notes (Signed)
127/88 pt's bp that transferred over via babyscripts app.

## 2021-04-18 NOTE — Social Work (Signed)
CSW received consult due to score 15 on Edinburgh Depression Screen.    CSW met with MOB yesterday. MOB is currently taking Zoloft. CSW provided education regarding Baby Blues vs PMADs and provided MOB with resources for mental health follow up.  CSW encouraged MOB to evaluate her mental health throughout the postpartum period with the use of the New Mom Checklist developed by Postpartum Progress as well as the [place] Postnatal Depression Scale and notify a medical professional if symptoms arise.     Kathrin Greathouse, MSW, LCSW [facility]  [phone] 04/18/2021  8:38 AM

## 2021-04-19 ENCOUNTER — Ambulatory Visit: Payer: Self-pay

## 2021-04-19 NOTE — Lactation Note (Signed)
This note was copied from a baby's chart. Lactation Consultation Note  Patient Name: Veronica Hendrix [id number] Date: 04/19/2021 Reason for consult: Follow-up assessment Age:34 Hours  Mother reports that her milk is coming in . She reports that she pumped 13 ml. She reports that she was able to give infant ebm and DBM with last feeding totally 30 ml. Mother is following guidelines of ETI. Mother also reports that she knows she will have to offer some formula when she gets home. Tips given for milk induction. Encouraged to continue to hand express before and after breastfeeding and pumping.  Mother is aware of available milk storage guidelines. ,,  Suggested that mother follow up with OP LC. Mother agreeable . Plan of Care : Breastfeed infant with feeding cues Supplement infant with ebm/DBM/formula, according to supplemental guidelines. Pump using a DEBP after each feeding for 15-20 mins.   Mother to continue to cue base feed infant and feed at least 8-12 times or more in 24 hours and advised to allow for cluster feeding infant as needed.   Mother to continue to due STS. Mother is aware of available LC services at [facility], BFSG'S, OP Dept, and phone # for questions or concerns about breastfeeding.  Mother receptive to all teaching and plan of care.     Maternal Data    Feeding Mother's Current Feeding Choice: Breast Milk  LATCH Score                    Lactation Tools Discussed/Used    Interventions Interventions: Hand express;Hand pump  Discharge Discharge Education: Engorgement and breast care;Warning signs for feeding baby;Outpatient recommendation Pump: Personal;Manual  Consult Status Consult Status: Complete    Michel Bickers 04/19/2021, 12:20 PM

## 2021-04-20 ENCOUNTER — Encounter: Payer: Self-pay | Admitting: Family Medicine

## 2021-04-20 ENCOUNTER — Other Ambulatory Visit: Payer: Medicaid Other

## 2021-04-22 ENCOUNTER — Encounter: Payer: Self-pay | Admitting: Family Medicine

## 2021-04-22 MED ORDER — FUROSEMIDE 20 MG PO TABS: 20.0000 mg | ORAL_TABLET | Freq: Every day | ORAL | 0 refills | Status: DC

## 2021-04-24 ENCOUNTER — Other Ambulatory Visit: Payer: Medicaid Other | Admitting: Obstetrics & Gynecology

## 2021-04-26 ENCOUNTER — Other Ambulatory Visit: Payer: Self-pay

## 2021-04-26 ENCOUNTER — Ambulatory Visit (INDEPENDENT_AMBULATORY_CARE_PROVIDER_SITE_OTHER): Payer: Medicaid Other | Admitting: Advanced Practice Midwife

## 2021-04-26 ENCOUNTER — Telehealth (HOSPITAL_COMMUNITY): Payer: Self-pay | Admitting: *Deleted

## 2021-04-26 ENCOUNTER — Encounter: Payer: Self-pay | Admitting: Advanced Practice Midwife

## 2021-04-26 VITALS — BP 134/82 | HR 98 | Ht 65.0 in | Wt 227.0 lb

## 2021-04-26 DIAGNOSIS — O165 Unspecified maternal hypertension, complicating the puerperium: Secondary | ICD-10-CM

## 2021-04-26 DIAGNOSIS — F411 Generalized anxiety disorder: Secondary | ICD-10-CM

## 2021-04-26 DIAGNOSIS — Z1331 Encounter for screening for depression: Secondary | ICD-10-CM

## 2021-04-26 MED ORDER — SERTRALINE HCL 50 MG PO TABS: 50.0000 mg | ORAL_TABLET | Freq: Every day | ORAL | 2 refills | Status: DC

## 2021-04-26 NOTE — Telephone Encounter (Signed)
Mom reports she's physically well. EPDS = 19 (was 15 in hospital). Is presently on Zoloft, but thinks physician may increase dosage today for appt with OB. Feeling anxious. No other concerns. Referring to [facility] for appointment.  Reports baby is doing well. Breastfeeding without difficulty. Jaundice levels are going down per mom. No concerns.

## 2021-04-26 NOTE — Progress Notes (Signed)
[facility] Clinic Visit  Patient name: Veronica Hendrix MRN [id number]  Date of birth: 1986-10-19  CC & HPI:  Veronica Hendrix is a 35 y.o.  female presenting today for mood check and BP check.  Had SVD a week ago, IOL for GHTN. DC'd on procardia.  On zoloft, ppRN sent message that pt will probably want to increase it today (see note in Epic).   Pertinent History Reviewed:  Medical & Surgical Hx:   Past Medical History:  Diagnosis Date   Bipolar 1 disorder (HCC)    Depression    Headache    History of pre-eclampsia in prior pregnancy, currently pregnant    HPV in female    Panic attacks    Post traumatic stress disorder (PTSD)    Vision abnormalities    Past Surgical History:  Procedure Laterality Date   BLOOD PATCH     Family History  Problem Relation Age of Onset   Diabetes Mother    Depression Mother    Mental illness Father    Hypertension Father    Depression Father    Anxiety disorder Father    Bipolar disorder Father    Heart disease Maternal Grandmother    Heart disease Maternal Grandfather    Heart disease Paternal Grandfather    Hydrocephalus Son     Current Outpatient Medications:    ibuprofen (ADVIL) 600 MG tablet, Take 1 tablet (600 mg total) by mouth every 6 (six) hours as needed for mild pain or moderate pain., Disp: 30 tablet, Rfl: 0   NIFEdipine (ADALAT CC) 30 MG 24 hr tablet, Take 1 tablet (30 mg total) by mouth daily., Disp: 30 tablet, Rfl: 1   Prenatal Vit-Fe Fumarate-FA (PRENATAL VITAMIN PO), Take 1 tablet by mouth daily., Disp: , Rfl:    furosemide (LASIX) 20 MG tablet, Take 1 tablet (20 mg total) by mouth daily. (Patient not taking: Reported on 04/26/2021), Disp: 6 tablet, Rfl: 0   loratadine (CLARITIN) 10 MG tablet, Take 10 mg by mouth daily. (Patient not taking: Reported on 04/26/2021), Disp: , Rfl:    sertraline (ZOLOFT) 50 MG tablet, Take 1 tablet (50 mg total) by mouth daily., Disp: 90 tablet, Rfl: 2 Social History: Reviewed -  reports that she has  never smoked. She has never used smokeless tobacco.  Review of Systems:   Constitutional: Negative for fever and chills Eyes: Negative for visual disturbances Respiratory: Negative for shortness of breath, dyspnea Cardiovascular: Negative for chest pain or palpitations  Gastrointestinal: Negative for vomiting, diarrhea and constipation; no abdominal pain Genitourinary: Negative for dysuria and urgency, vaginal irritation or itching Musculoskeletal: Negative for back pain, joint pain, myalgias  Neurological: Negative for dizziness and headaches    Objective Findings:    Physical Examination: Vitals:   04/26/21 1405  BP: 134/82  Pulse: 98   General appearance - well appearing, and in no distress Mental status - alert, oriented to person, place, and time Chest:  Normal respiratory effort Heart - normal rate and regular rhythm Abdomen:  Soft, nontender Pelvic: deferred Musculoskeletal:  Normal range of motion without pain Extremities:  No edema    No results found for this or any previous visit (from the past 24 hour(s)).    Assessment & Plan:  A/P:  GHTN:   Continue procardia. DC 3 days prior to PP checkup   Anxiety:  increase zoloft to 50mg  daily      Return for As scheduled.  CNM 04/26/2021 2:24 PM

## 2021-04-27 ENCOUNTER — Other Ambulatory Visit: Payer: Medicaid Other

## 2021-05-02 ENCOUNTER — Telehealth (HOSPITAL_COMMUNITY): Payer: Self-pay | Admitting: *Deleted

## 2021-05-02 NOTE — Telephone Encounter (Signed)
Duffy Rhody, RN 05/02/2021 at 4:40pm

## 2021-05-02 NOTE — Telephone Encounter (Signed)
Spoke with partner who reports she's at a lactation appointment right now, but she had her OB appt last week and everything checked out well. Will repeat call later today.  Duffy Rhody, RN 7?13/2022 at 2:01pm

## 2021-05-21 HISTORY — PX: CHOLECYSTECTOMY: SHX55

## 2021-05-22 ENCOUNTER — Ambulatory Visit: Payer: Medicaid Other | Admitting: Women's Health

## 2021-06-15 ENCOUNTER — Telehealth: Payer: Medicaid Other | Admitting: Physician Assistant

## 2021-06-15 ENCOUNTER — Other Ambulatory Visit: Payer: Self-pay

## 2021-06-15 DIAGNOSIS — R112 Nausea with vomiting, unspecified: Secondary | ICD-10-CM | POA: Diagnosis not present

## 2021-06-15 DIAGNOSIS — R1031 Right lower quadrant pain: Secondary | ICD-10-CM

## 2021-06-15 DIAGNOSIS — M545 Low back pain, unspecified: Secondary | ICD-10-CM

## 2021-06-15 NOTE — Patient Instructions (Signed)
Mayia M Pulliam, thank you for joining Piedad Climes, PA-C for today's virtual visit.  While this provider is not your primary care provider (PCP), if your PCP is located in our provider database this encounter information will be shared with them immediately following your visit.  Consent: (Patient) Veronica Hendrix provided verbal consent for this virtual visit at the beginning of the encounter.  Current Medications:  Current Outpatient Medications:    furosemide (LASIX) 20 MG tablet, Take 1 tablet (20 mg total) by mouth daily. (Patient not taking: Reported on 04/26/2021), Disp: 6 tablet, Rfl: 0   ibuprofen (ADVIL) 600 MG tablet, Take 1 tablet (600 mg total) by mouth every 6 (six) hours as needed for mild pain or moderate pain., Disp: 30 tablet, Rfl: 0   loratadine (CLARITIN) 10 MG tablet, Take 10 mg by mouth daily. (Patient not taking: Reported on 04/26/2021), Disp: , Rfl:    NIFEdipine (ADALAT CC) 30 MG 24 hr tablet, Take 1 tablet (30 mg total) by mouth daily., Disp: 30 tablet, Rfl: 1   Prenatal Vit-Fe Fumarate-FA (PRENATAL VITAMIN PO), Take 1 tablet by mouth daily., Disp: , Rfl:    sertraline (ZOLOFT) 50 MG tablet, Take 1 tablet (50 mg total) by mouth daily., Disp: 90 tablet, Rfl: 2   Medications ordered in this encounter:  No orders of the defined types were placed in this encounter.    *If you need refills on other medications prior to your next appointment, please contact your pharmacy*  Follow-Up: Call back or seek an in-person evaluation if the symptoms worsen or if the condition fails to improve as anticipated.  Other Instructions     Emergency Department-[facility]  Get Driving Directions  [phone]  [address]  [place], [place] [address]  Open 24/7/365      [facility] Emergency Department at [facility]  Get Driving Directions  [address]  [place], [place] [address]  Open 24/7/365    Emergency Department- [facility]  [facility]  Get Driving Directions  [phone]  [address]  [place], [place] [address]  Open 24/7/365      [facility] at [facility]  Get Driving Directions  [phone]  [address]  [place], [place] [address]  Open 24/7/365    [facility]  Emergency Department- [facility]  Get Driving Directions  [phone]  [address]  [place], [place] [address]  Open 24/7/365    [place]  Emergency Department- [facility]  Get Driving Directions  [address]  [place], [place] [address]  Open 24/7/365    [facility]  Emergency Department- [facility]  Get Driving Directions  [phone]  [address]  [place], [place] [address]  Open 24/7/365      If you have been instructed to have an in-person evaluation today at a local Urgent Care facility, please use the link below. It will take you to a list of all of our available [facility], including address, phone number and hours of operation. Please do not delay care.  [facility]  If you or a family member do not have a primary care provider, use the link below to schedule a visit and establish care. When you choose a [facility] primary care physician or advanced practice provider, you gain a long-term partner in health. Find a Primary Care Provider  Learn more about [facility]  in-office and virtual care options: [facility]

## 2021-06-15 NOTE — Progress Notes (Signed)
Virtual Visit Consent   Veronica Hendrix, you are scheduled for a virtual visit with a [facility] provider today.     Just as with appointments in the office, your consent must be obtained to participate.  Your consent will be active for this visit and any virtual visit you may have with one of our providers in the next 365 days.     If you have a MyChart account, a copy of this consent can be sent to you electronically.  All virtual visits are billed to your insurance company just like a traditional visit in the office.    As this is a virtual visit, video technology does not allow for your provider to perform a traditional examination.  This may limit your provider's ability to fully assess your condition.  If your provider identifies any concerns that need to be evaluated in person or the need to arrange testing (such as labs, EKG, etc.), we will make arrangements to do so.     Although advances in technology are sophisticated, we cannot ensure that it will always work on either your end or our end.  If the connection with a video visit is poor, the visit may have to be switched to a telephone visit.  With either a video or telephone visit, we are not always able to ensure that we have a secure connection.     I need to obtain your verbal consent now.   Are you willing to proceed with your visit today?    Veronica Hendrix has provided verbal consent on 06/15/2021 for a virtual visit (video or telephone).   Piedad Climes, [place]   Date: 06/15/2021 2:52 PM   Virtual Visit via Video Note   I, Piedad Climes, connected with  Veronica Hendrix  ([id number], 04-22-1986) on 06/15/21 at  2:45 PM EDT by a video-enabled telemedicine application and verified that I am speaking with the correct person using two identifiers.  Location: Patient: Virtual Visit Location Patient: Home Provider: Virtual Visit Location Provider: Home Office   I discussed the limitations of evaluation and management by  telemedicine and the availability of in person appointments. The patient expressed understanding and agreed to proceed.    History of Present Illness: Veronica Hendrix is a 35 y.o. who identifies as a female who was assigned female at birth, and is being seen today for severe low back pain radiating to RLQ abdomen x 1.5 days, first noted in early morning, waking her from sleep. Caused subsequent episodes of non-bloody emesis yesterday. None today. Denies change to bowel or bladder habits. Denies fever, chest pain or SOB. Is 6 weeks postpartum. Notes pain is 5-6/10 currently but has been 10/10. Feels lightheaded when walking due to pain.   HPI: HPI  Problems:  Patient Active Problem List   Diagnosis Date Noted   Gestational hypertension 04/16/2021   Gestational hypertension, third trimester 04/10/2021   Polyhydramnios 03/20/2021   Gestational diabetes mellitus, class A2 02/20/2021   Rubella non-immune status, antepartum 11/07/2020   History of gestational diabetes in prior pregnancy, currently pregnant 11/02/2020   History of pre-eclampsia in prior pregnancy, currently pregnant 09/13/2020   History of postpartum depression, currently pregnant 05/31/2019   Anxiety state 01/01/2017   Moderate episode of recurrent major depressive disorder (HCC) 10/03/2016   Idiopathic intracranial hypertension 10/02/2016   Acute back pain 10/02/2016   Papilledema of both eyes 08/06/2016   Headache 08/06/2016   Visual disturbance 08/06/2016   Snoring  08/06/2016   OSA (obstructive sleep apnea) 08/06/2016   Excessive daytime sleepiness 08/06/2016   Insomnia 08/06/2016   Bipolar II disorder (HCC) 11/08/2015    Allergies: No Known Allergies Medications:  Current Outpatient Medications:    furosemide (LASIX) 20 MG tablet, Take 1 tablet (20 mg total) by mouth daily. (Patient not taking: Reported on 04/26/2021), Disp: 6 tablet, Rfl: 0   ibuprofen (ADVIL) 600 MG tablet, Take 1 tablet (600 mg total) by mouth every 6  (six) hours as needed for mild pain or moderate pain., Disp: 30 tablet, Rfl: 0   loratadine (CLARITIN) 10 MG tablet, Take 10 mg by mouth daily. (Patient not taking: Reported on 04/26/2021), Disp: , Rfl:    NIFEdipine (ADALAT CC) 30 MG 24 hr tablet, Take 1 tablet (30 mg total) by mouth daily., Disp: 30 tablet, Rfl: 1   Prenatal Vit-Fe Fumarate-FA (PRENATAL VITAMIN PO), Take 1 tablet by mouth daily., Disp: , Rfl:    sertraline (ZOLOFT) 50 MG tablet, Take 1 tablet (50 mg total) by mouth daily., Disp: 90 tablet, Rfl: 2  Observations/Objective: Patient is well-developed, well-nourished in acute painful distress.  Resting uncomfortably at home.  Head is normocephalic, atraumatic.  No labored breathing. Speech is clear and coherent with logical content.  Patient is alert and oriented at baseline.   Assessment and Plan: 1. RLQ abdominal pain  2. Acute right-sided low back pain without sciatica  3. Non-intractable vomiting with nausea, unspecified vomiting type Severe pain with radiation to abdomen, mainly RLQ in 6 wk postpartum female. Woke from sleep with subsequent day of vomiting. She needs emergent assessment giving severity of symptoms. She has been triaged to ER. [facility]. Her significant other is taking her to be evaluated now.   Follow Up Instructions: I discussed the assessment and treatment plan with the patient. The patient was provided an opportunity to ask questions and all were answered. The patient agreed with the plan and demonstrated an understanding of the instructions.  A copy of instructions were sent to the patient via MyChart.  The patient was advised to call back or seek an in-person evaluation if the symptoms worsen or if the condition fails to improve as anticipated.  Time:  I spent 10 minutes with the patient via telehealth technology discussing the above problems/concerns.    Piedad Climes, PA-C

## 2021-09-10 ENCOUNTER — Other Ambulatory Visit: Payer: Self-pay

## 2021-09-10 ENCOUNTER — Telehealth: Payer: Self-pay | Admitting: Adult Health

## 2021-09-10 ENCOUNTER — Encounter: Payer: Self-pay | Admitting: Adult Health

## 2021-09-10 ENCOUNTER — Encounter: Payer: Medicaid Other | Admitting: Adult Health

## 2021-09-10 NOTE — Telephone Encounter (Signed)
Pt at work, have her call me in am

## 2021-09-11 NOTE — Progress Notes (Signed)
This encounter was created in error - please disregard.

## 2021-10-18 ENCOUNTER — Other Ambulatory Visit: Payer: Self-pay

## 2021-11-07 ENCOUNTER — Other Ambulatory Visit: Payer: Self-pay

## 2021-11-07 ENCOUNTER — Emergency Department: Payer: Medicaid Other

## 2021-11-07 ENCOUNTER — Encounter: Payer: Self-pay | Admitting: Emergency Medicine

## 2021-11-07 ENCOUNTER — Emergency Department
Admission: EM | Admit: 2021-11-07 | Discharge: 2021-11-07 | Disposition: A | Discharge status: Home / Self Care | Payer: Medicaid Other | Attending: Emergency Medicine | Admitting: Emergency Medicine

## 2021-11-07 DIAGNOSIS — Z3A09 9 weeks gestation of pregnancy: Secondary | ICD-10-CM | POA: Diagnosis not present

## 2021-11-07 DIAGNOSIS — R102 Pelvic and perineal pain: Secondary | ICD-10-CM | POA: Diagnosis not present

## 2021-11-07 DIAGNOSIS — O418X11 Other specified disorders of amniotic fluid and membranes, first trimester, fetus 1: Secondary | ICD-10-CM | POA: Diagnosis not present

## 2021-11-07 DIAGNOSIS — O4690 Antepartum hemorrhage, unspecified, unspecified trimester: Secondary | ICD-10-CM

## 2021-11-07 DIAGNOSIS — O26851 Spotting complicating pregnancy, first trimester: Secondary | ICD-10-CM | POA: Insufficient documentation

## 2021-11-07 DIAGNOSIS — O418X10 Other specified disorders of amniotic fluid and membranes, first trimester, not applicable or unspecified: Secondary | ICD-10-CM

## 2021-11-07 LAB — CBC WITH DIFFERENTIAL/PLATELET
Abs Immature Granulocytes: 0.05 10*3/uL (ref 0.00–0.07)
Basophils Absolute: 0.1 10*3/uL (ref 0.0–0.1)
Basophils Relative: 0 %
Eosinophils Absolute: 0.4 10*3/uL (ref 0.0–0.5)
Eosinophils Relative: 3 %
HCT: 37.9 % (ref 36.0–46.0)
Hemoglobin: 12.9 g/dL (ref 12.0–15.0)
Immature Granulocytes: 0 %
Lymphocytes Relative: 27 %
Lymphs Abs: 3.4 10*3/uL (ref 0.7–4.0)
MCH: 29.6 pg (ref 26.0–34.0)
MCHC: 34 g/dL (ref 30.0–36.0)
MCV: 86.9 fL (ref 80.0–100.0)
Monocytes Absolute: 0.4 10*3/uL (ref 0.1–1.0)
Monocytes Relative: 3 %
Neutro Abs: 8.6 10*3/uL — ABNORMAL HIGH (ref 1.7–7.7)
Neutrophils Relative %: 67 %
Platelets: 324 10*3/uL (ref 150–400)
RBC: 4.36 MIL/uL (ref 3.87–5.11)
RDW: 13.1 % (ref 11.5–15.5)
WBC: 12.9 10*3/uL — ABNORMAL HIGH (ref 4.0–10.5)
nRBC: 0 % (ref 0.0–0.2)

## 2021-11-07 LAB — URINALYSIS, ROUTINE W REFLEX MICROSCOPIC
Bacteria, UA: NONE SEEN
Bilirubin Urine: NEGATIVE
Glucose, UA: NEGATIVE mg/dL
Ketones, ur: NEGATIVE mg/dL
Nitrite: NEGATIVE
Protein, ur: 100 mg/dL — AB
RBC / HPF: >50 RBC/hpf — ABNORMAL HIGH (ref 0–5)
Specific Gravity, Urine: 1.027 (ref 1.005–1.030)
pH: 6 (ref 5.0–8.0)

## 2021-11-07 LAB — COMPREHENSIVE METABOLIC PANEL
ALT: 39 U/L (ref 0–44)
AST: 27 U/L (ref 15–41)
Albumin: 4 g/dL (ref 3.5–5.0)
Alkaline Phosphatase: 73 U/L (ref 38–126)
Anion gap: 6 (ref 5–15)
BUN: 12 mg/dL (ref 6–20)
CO2: 23 mmol/L (ref 22–32)
Calcium: 8.9 mg/dL (ref 8.9–10.3)
Chloride: 107 mmol/L (ref 98–111)
Creatinine, Ser: 0.7 mg/dL (ref 0.44–1.00)
GFR, Estimated: >60 mL/min (ref >60)
Glucose, Bld: 101 mg/dL — ABNORMAL HIGH (ref 70–99)
Potassium: 4.1 mmol/L (ref 3.5–5.1)
Sodium: 136 mmol/L (ref 135–145)
Total Bilirubin: 0.6 mg/dL (ref 0.3–1.2)
Total Protein: 6.8 g/dL (ref 6.5–8.1)

## 2021-11-07 LAB — ABO/RH: ABO/RH(D): O POS

## 2021-11-07 LAB — POC URINE PREG, ED: Preg Test, Ur: POSITIVE — AB

## 2021-11-07 LAB — HCG, QUANTITATIVE, PREGNANCY: hCG, Beta Chain, Quant, S: 125003 m[IU]/mL — ABNORMAL HIGH (ref <5)

## 2021-11-07 NOTE — ED Triage Notes (Signed)
Pt comes into the ED via POV c/o vaginal bleeding that started today.  Pt is about [redacted] weeks pregnant.  Denies any pain at this time.  Pt does admit falling down some stairs a couple days ago.  Pt ambulatory with even and unlabored respirations.

## 2021-11-07 NOTE — ED Provider Notes (Signed)
[facility] Provider Note  Patient Contact: 7:36 PM (approximate)   History   Vaginal Bleeding   HPI  Veronica Hendrix is a 36 y.o. female who presents the emergency department complaining of vaginal bleeding.  Patient states that she is approximately [redacted] weeks pregnant.  She is G4, P3.  States that she is having no abdominal pain.  She has had no urinary symptoms.  Had no concerning vaginal discharge or bleeding prior to today.  Patient states that she is bleeding "about the normal amount for period."  Patient denies any chest pain, shortness of breath.  She states that she is scheduled to see her OB/GYN for 3 February which is in roughly 2 weeks.  Patient has not seen her OB/GYN yet this pregnancy.  She is supposed to see the MFM clinic at [facility] for OB/GYN coverage.     Physical Exam   Triage Vital Signs: ED Triage Vitals  Enc Vitals Group     BP 11/07/21 1852 120/78     Pulse Rate 11/07/21 1852 (!) 105     Resp 11/07/21 1852 16     Temp 11/07/21 1852 99.1 F (37.3 C)     Temp Source 11/07/21 1852 Oral     SpO2 11/07/21 1852 98 %     Weight 11/07/21 1851 227 lb 1.2 oz (103 kg)     Height 11/07/21 1851 5\' 5"  (1.651 m)     Head Circumference --      Peak Flow --      Pain Score 11/07/21 1851 0     Pain Loc --      Pain Edu? --      Excl. in [place]? --     Most recent vital signs: Vitals:   11/07/21 1852 11/07/21 2130  BP: 120/78 (!) 106/54  Pulse: (!) 105 89  Resp: 16 17  Temp: 99.1 F (37.3 C)   SpO2: 98% 100%     General: Alert and in no acute distress.  Cardiovascular:  Good peripheral perfusion Respiratory: Normal respiratory effort without tachypnea or retractions. Lungs CTAB. Good air entry to the bases with no decreased or absent breath sounds. Gastrointestinal: Bowel sounds 4 quadrants. Soft and nontender to palpation. No guarding or rigidity. No palpable masses. No distention. No CVA tenderness. Musculoskeletal: Full range of motion to  all extremities.  Neurologic:  No gross focal neurologic deficits are appreciated.  Skin:   No rash noted Other:   ED Results / Procedures / Treatments   Labs (all labs ordered are listed, but only abnormal results are displayed) Labs Reviewed  HCG, QUANTITATIVE, PREGNANCY - Abnormal; Notable for the following components:      Result Value   hCG, Beta Chain, Quant, S 125,003 (*)    All other components within normal limits  COMPREHENSIVE METABOLIC PANEL - Abnormal; Notable for the following components:   Glucose, Bld 101 (*)    All other components within normal limits  CBC WITH DIFFERENTIAL/PLATELET - Abnormal; Notable for the following components:   WBC 12.9 (*)    Neutro Abs 8.6 (*)    All other components within normal limits  URINALYSIS, ROUTINE W REFLEX MICROSCOPIC - Abnormal; Notable for the following components:   Color, Urine YELLOW (*)    APPearance CLOUDY (*)    Hgb urine dipstick LARGE (*)    Protein, ur 100 (*)    Leukocytes,Ua SMALL (*)    RBC / HPF >50 (*)    All  other components within normal limits  POC URINE PREG, ED - Abnormal; Notable for the following components:   Preg Test, Ur POSITIVE (*)    All other components within normal limits  ABO/RH     EKG     RADIOLOGY  I personally viewed and evaluated these images as part of my medical decision making, as well as reviewing the written report by the radiologist.  ED Provider Interpretation: Single intrauterine pregnancy, subchorionic hemorrhage.  US OB LESS THAN 14 WEEKS WITH OB TRANSVAGINAL  Result Date: 11/07/2021 CLINICAL DATA:  First trimester bleeding. EXAM: OBSTETRIC <14 WK [place] AND TRANSVAGINAL OB US TECHNIQUE: Both transabdominal and transvaginal ultrasound examinations were performed for complete evaluation of the gestation as well as the maternal uterus, adnexal regions, and pelvic cul-de-sac. Transvaginal technique was performed to assess early pregnancy. COMPARISON:  April 10, 2021.  FINDINGS: Intrauterine gestational sac: Single Yolk sac:  Visualized. Embryo:  Visualized. Cardiac Activity: Visualized. Heart Rate: 166 bpm CRL:  23 mm   9 w   0 d                  [place] EDC: June 12, 2022 Subchorionic hemorrhage:  Moderate (3.3 cm x 2.1 cm x 2.2 cm) Maternal uterus/adnexae: The right and left ovaries are visualized and are normal in appearance. IMPRESSION: 1. Single, viable intrauterine pregnancy at approximately 9 weeks and 0 days gestation by ultrasound evaluation. 2. Moderate sized subchorionic hemorrhage. Correlation with short-term follow-up pelvic ultrasound is recommended to determine stability. Electronically Signed   By: Virgina Norfolk M.D.   On: 11/07/2021 20:50    PROCEDURES:  Critical Care performed: No  Procedures   MEDICATIONS ORDERED IN ED: Medications - No data to display   IMPRESSION / MDM / [place] / ED COURSE  I reviewed the triage vital signs and the nursing notes.                              Differential diagnosis includes, but is not limited to, miscarriage, vaginal bleeding in pregnancy, subchorionic hemorrhage, vaginal laceration   Patient's diagnosis is consistent with subchorionic hemorrhage with vaginal bleeding in pregnancy.  Patient presented to the emergency department bleeding and is roughly [redacted] weeks pregnant.  No abdominal pain.  No trauma is reported.  Patient denies any dysuria, polyuria, hematuria.  There was no vaginal discharge or bleeding prior to the onset of vaginal bleeding today.  Patient had labs, ultrasound and urinalysis performed.  Urinalysis, CBC, CMP are reassuring.  CBC with slight elevation of the white blood cell count.  ECG reveals findings consistent with 35 to 35 weeks pregnancy.  Patient is [redacted] weeks pregnant by ultrasound with reassuring fetal heart rate.  Patient has a moderate subchorionic hematoma.  At this time patient will be discharged with instructions for close follow-up with OB/GYN.  I referred the  patient to an OB/GYN here though she seeks care typically at [facility] for her OB/GYN services.  Patient may follow-up with the emergency department for repeat ultrasound in 3 to 4 days if needed.  Concerning signs and symptoms are discussed with the patient.. Patient is given ED precautions to return to the ED for any worsening or new symptoms.        FINAL CLINICAL IMPRESSION(S) / ED DIAGNOSES   Final diagnoses:  Vaginal bleeding in pregnancy  Subchorionic hemorrhage of placenta in first trimester, single or unspecified fetus  Rx / DC Orders   ED Discharge Orders     None        Note:  This document was prepared using Dragon voice recognition software and may include unintentional dictation errors.   Brynda Peon 11/08/21 0007    Harvest Dark, MD 11/08/21 2158

## 2021-11-19 ENCOUNTER — Encounter: Payer: Self-pay | Admitting: Obstetrics and Gynecology

## 2021-12-28 ENCOUNTER — Inpatient Hospital Stay: Payer: Medicaid Other

## 2021-12-28 ENCOUNTER — Observation Stay
Admission: EM | Admit: 2021-12-28 | Discharge: 2021-12-29 | Disposition: A | Discharge status: Home / Self Care | Payer: Medicaid Other | Attending: Obstetrics and Gynecology | Admitting: Obstetrics and Gynecology

## 2021-12-28 ENCOUNTER — Other Ambulatory Visit: Payer: Self-pay

## 2021-12-28 DIAGNOSIS — Z20822 Contact with and (suspected) exposure to covid-19: Secondary | ICD-10-CM | POA: Insufficient documentation

## 2021-12-28 DIAGNOSIS — Z3A16 16 weeks gestation of pregnancy: Secondary | ICD-10-CM | POA: Diagnosis not present

## 2021-12-28 DIAGNOSIS — O42912 Preterm premature rupture of membranes, unspecified as to length of time between rupture and onset of labor, second trimester: Secondary | ICD-10-CM | POA: Diagnosis present

## 2021-12-28 DIAGNOSIS — O4290 Premature rupture of membranes, unspecified as to length of time between rupture and onset of labor, unspecified weeks of gestation: Secondary | ICD-10-CM

## 2021-12-28 DIAGNOSIS — O42919 Preterm premature rupture of membranes, unspecified as to length of time between rupture and onset of labor, unspecified trimester: Secondary | ICD-10-CM

## 2021-12-28 LAB — CBC WITH DIFFERENTIAL/PLATELET
Abs Immature Granulocytes: 0.05 10*3/uL (ref 0.00–0.07)
Basophils Absolute: 0 10*3/uL (ref 0.0–0.1)
Basophils Relative: 0 %
Eosinophils Absolute: 0.5 10*3/uL (ref 0.0–0.5)
Eosinophils Relative: 4 %
HCT: 33.5 % — ABNORMAL LOW (ref 36.0–46.0)
Hemoglobin: 11.5 g/dL — ABNORMAL LOW (ref 12.0–15.0)
Immature Granulocytes: 1 %
Lymphocytes Relative: 21 %
Lymphs Abs: 2.3 10*3/uL (ref 0.7–4.0)
MCH: 29.9 pg (ref 26.0–34.0)
MCHC: 34.3 g/dL (ref 30.0–36.0)
MCV: 87 fL (ref 80.0–100.0)
Monocytes Absolute: 0.4 10*3/uL (ref 0.1–1.0)
Monocytes Relative: 4 %
Neutro Abs: 7.4 10*3/uL (ref 1.7–7.7)
Neutrophils Relative %: 70 %
Platelets: 287 10*3/uL (ref 150–400)
RBC: 3.85 MIL/uL — ABNORMAL LOW (ref 3.87–5.11)
RDW: 13.7 % (ref 11.5–15.5)
WBC: 10.7 10*3/uL — ABNORMAL HIGH (ref 4.0–10.5)
nRBC: 0 % (ref 0.0–0.2)

## 2021-12-28 LAB — CBC
HCT: 35.6 % — ABNORMAL LOW (ref 36.0–46.0)
Hemoglobin: 12 g/dL (ref 12.0–15.0)
MCH: 29.6 pg (ref 26.0–34.0)
MCHC: 33.7 g/dL (ref 30.0–36.0)
MCV: 87.9 fL (ref 80.0–100.0)
Platelets: 291 10*3/uL (ref 150–400)
RBC: 4.05 MIL/uL (ref 3.87–5.11)
RDW: 13.6 % (ref 11.5–15.5)
WBC: 13.5 10*3/uL — ABNORMAL HIGH (ref 4.0–10.5)
nRBC: 0 % (ref 0.0–0.2)

## 2021-12-28 LAB — BASIC METABOLIC PANEL
Anion gap: 7 (ref 5–15)
BUN: 8 mg/dL (ref 6–20)
CO2: 22 mmol/L (ref 22–32)
Calcium: 9.1 mg/dL (ref 8.9–10.3)
Chloride: 106 mmol/L (ref 98–111)
Creatinine, Ser: 0.54 mg/dL (ref 0.44–1.00)
GFR, Estimated: >60 mL/min (ref >60)
Glucose, Bld: 111 mg/dL — ABNORMAL HIGH (ref 70–99)
Potassium: 3.9 mmol/L (ref 3.5–5.1)
Sodium: 135 mmol/L (ref 135–145)

## 2021-12-28 LAB — TYPE AND SCREEN
ABO/RH(D): O POS
ABO/RH(D): O POS
Antibody Screen: NEGATIVE
Antibody Screen: NEGATIVE

## 2021-12-28 MED ORDER — MEPERIDINE HCL 100 MG/ML IJ SOLN
100.0000 mg | Freq: Once | INTRAMUSCULAR | Status: AC
  Administered 2021-12-29: 100 mg via INTRAMUSCULAR

## 2021-12-28 MED ORDER — LIDOCAINE HCL (PF) 1 % IJ SOLN
30.0000 mL | INTRAMUSCULAR | Status: DC | PRN
  Filled 2021-12-28: qty 30

## 2021-12-28 MED ORDER — OXYTOCIN BOLUS FROM INFUSION
333.0000 mL | Freq: Once | INTRAVENOUS | Status: AC
  Administered 2021-12-29: 333 mL via INTRAVENOUS

## 2021-12-28 MED ORDER — OXYTOCIN 10 UNIT/ML IJ SOLN
INTRAMUSCULAR | Status: AC
  Filled 2021-12-28: qty 2

## 2021-12-28 MED ORDER — ONDANSETRON HCL 4 MG/2ML IJ SOLN: 4.0000 mg | Freq: Once | INTRAMUSCULAR | Status: AC

## 2021-12-28 MED ORDER — ONDANSETRON HCL 4 MG/2ML IJ SOLN: 4.0000 mg | Freq: Four times a day (QID) | INTRAMUSCULAR | Status: DC | PRN

## 2021-12-28 MED ORDER — MISOPROSTOL 200 MCG PO TABS
600.0000 ug | ORAL_TABLET | Freq: Once | ORAL | Status: AC
  Filled 2021-12-28: qty 3

## 2021-12-28 MED ORDER — ONDANSETRON HCL 4 MG/2ML IJ SOLN
INTRAMUSCULAR | Status: AC
  Administered 2021-12-28: 4 mg via INTRAVENOUS
  Filled 2021-12-28: qty 2

## 2021-12-28 MED ORDER — BUTORPHANOL TARTRATE 1 MG/ML IJ SOLN: 1.0000 mg | INTRAMUSCULAR | Status: DC | PRN

## 2021-12-28 MED ORDER — MISOPROSTOL 200 MCG PO TABS
400.0000 ug | ORAL_TABLET | ORAL | Status: DC
  Administered 2021-12-29: 400 ug via ORAL
  Filled 2021-12-28: qty 2

## 2021-12-28 MED ORDER — SOD CITRATE-CITRIC ACID 500-334 MG/5ML PO SOLN
30.0000 mL | ORAL | Status: DC | PRN
  Administered 2021-12-28: 30 mL via ORAL

## 2021-12-28 MED ORDER — ACETAMINOPHEN 325 MG PO TABS
650.0000 mg | ORAL_TABLET | ORAL | Status: DC | PRN
  Filled 2021-12-28: qty 2

## 2021-12-28 MED ORDER — AMMONIA AROMATIC IN INHA
RESPIRATORY_TRACT | Status: AC
  Filled 2021-12-28: qty 10

## 2021-12-28 MED ORDER — LACTATED RINGERS IV SOLN: INTRAVENOUS | Status: DC

## 2021-12-28 MED ORDER — MISOPROSTOL 200 MCG PO TABS
ORAL_TABLET | ORAL | Status: AC
  Administered 2021-12-28: 600 ug via VAGINAL
  Filled 2021-12-28: qty 4

## 2021-12-28 MED ORDER — PROMETHAZINE HCL 25 MG/ML IJ SOLN
25.0000 mg | Freq: Once | INTRAMUSCULAR | Status: AC
  Administered 2021-12-29: 25 mg via INTRAMUSCULAR
  Filled 2021-12-28: qty 1

## 2021-12-28 MED ORDER — OXYTOCIN-SODIUM CHLORIDE 30-0.9 UT/500ML-% IV SOLN
2.5000 [IU]/h | INTRAVENOUS | Status: DC
  Filled 2021-12-28: qty 500

## 2021-12-28 MED ORDER — LACTATED RINGERS IV SOLN: 500.0000 mL | INTRAVENOUS | Status: DC | PRN

## 2021-12-28 NOTE — ED Triage Notes (Signed)
Patient to ER via POV. Reports being approx [redacted] weeks pregnant. States approx 2-30 minutes ago she felt a sudden rush of fluid and has been continuously leaking fluid since. Denies bleeding or discharge. Reports normal pregnancy other than subchorionic hemorrhage.  ? ?[facility] through [facility].  ?

## 2021-12-28 NOTE — ED Provider Notes (Signed)
? ?[facility] ?Provider Note ? ? ? Event Date/Time  ? First MD Initiated Contact with Patient 12/28/21 1321   ?  (approximate) ? ? ?History  ? ?Pregnancy Complication ? ? ?HPI ? ?Veronica Hendrix is a 36 y.o. female [phone] at 16 weeks, followed at the high risk clinic at [facility] due to prior preeclampsia, who presents with leakage of a large amount of fluid from her vagina acutely about 20 to 30 minutes prior to coming to the ED.  She reports some crampy left-sided pain starting afterwards.  She denies dizziness, fever, vomiting, or other acute symptoms. ? ? ? ? ?Physical Exam  ? ?Triage Vital Signs: ?ED Triage Vitals  ?Enc Vitals Group  ?   BP 12/28/21 1323 (!) 144/107  ?   Pulse Rate 12/28/21 1323 92  ?   Resp 12/28/21 1323 16  ?   Temp 12/28/21 1323 98.6 ?F (37 ?C)  ?   Temp Source 12/28/21 1323 Oral  ?   SpO2 12/28/21 1323 98 %  ?   Weight 12/28/21 1320 219 lb (99.3 kg)  ?   Height 12/28/21 1320 5\' 5"  (1.651 m)  ?   Head Circumference --   ?   Peak Flow --   ?   Pain Score 12/28/21 1320 0  ?   Pain Loc --   ?   Pain Edu? --   ?   Excl. in [place]? --   ? ? ?Most recent vital signs: ?Vitals:  ? 12/28/21 1445 12/28/21 1505  ?BP:  116/70  ?Pulse: 88 92  ?Resp:  16  ?Temp:  98.7 ?F (37.1 ?C)  ?SpO2: 98%   ? ? ? ?General: Awake, no distress.  ?CV:  Good peripheral perfusion.  ?Resp:  Normal effort.  ?Abd:  Soft and nontender.  No distention.  ?Other:  Large amount of straw-colored fluid from the vagina on sterile speculum exam.  No blood. ? ? ?ED Results / Procedures / Treatments  ? ?Labs ?(all labs ordered are listed, but only abnormal results are displayed) ?Labs Reviewed  ?BASIC METABOLIC PANEL - Abnormal; Notable for the following components:  ?    Result Value  ? Glucose, Bld 111 (*)   ? All other components within normal limits  ?CBC WITH DIFFERENTIAL/PLATELET - Abnormal; Notable for the following components:  ? WBC 10.7 (*)   ? RBC 3.85 (*)   ? Hemoglobin 11.5 (*)   ? HCT 33.5 (*)   ? All other  components within normal limits  ?TYPE AND SCREEN  ? ? ? ?EKG ? ? ? ? ?RADIOLOGY ? ? ?PROCEDURES: ? ?Critical Care performed: No ? ?Procedures ? ? ?MEDICATIONS ORDERED IN ED: ?Medications - No data to display ? ? ?IMPRESSION / MDM / ASSESSMENT AND PLAN / ED COURSE  ?I reviewed the triage vital signs and the nursing notes. ? ?36 year old female with PMH as noted above, currently [redacted] weeks pregnant presents with acute onset of a large amount of fluid from her vagina. ? ?I reviewed the past medical records.  The patient was most recently evaluated on 2/28 at the MFM clinic at [facility].  She has a history of severe preeclampsia. ? ?On exam the vital signs are normal.  The abdomen is soft and nontender.  I performed a sterile speculum exam, however with minimal advance of the speculum there is a large amount of fluid from the vagina consistent with PPROM.  I performed a bedside ultrasound showing an IUP  with FHR of around 140.  We will obtain lab work-up, OB/GYN consultation and reassess. ? ?----------------------------------------- ?2:10 PM on 12/28/2021 ?----------------------------------------- ? ?I discussed the case with Dr. Logan Bores from OB/GYN.  He recommends bringing the patient up to labor and delivery.  He advises that there are no other interventions possible given that the patient is previable.  I counseled the patient on this and she was very distraught but understanding.  We will make arrangements to have the patient moved to labor and delivery.  She is stable for transfer upstairs at this time. ? ? ?FINAL CLINICAL IMPRESSION(S) / ED DIAGNOSES  ? ?Final diagnoses:  ?Preterm premature rupture of membranes, unspecified duration to onset of labor  ? ? ? ?Rx / DC Orders  ? ?ED Discharge Orders   ? ? None  ? ?  ? ? ? ?Note:  This document was prepared using Dragon voice recognition software and may include unintentional dictation errors.  ?  Dionne Bucy, MD ?12/28/21 1633 ? ?

## 2021-12-28 NOTE — ED Notes (Signed)
Large gush of fluid noted with speculum exam. Fluid noted to be light brown with no particulate matter. Provider performing bedside ultrasound exam now. Pt crying. Cardiac activity of fetus noted on ultrasound exam. ? ?FHT rate is 140. ?

## 2021-12-28 NOTE — ED Notes (Signed)
Pt is G4P3 at about 16 weeks pregnancy, states may have began feeling FM about 1 week ago but is unsure and usually feeels FM at around 24 weeks, states that about 30 minutes before arrival to ED she felt several gushes of fluid from vagina, states fluid was clear. Denies discoloration of fluids (such as meconium or blood stained).  ?Has diagnosed subchorionic hemorrhage.  ? ?EDP has seen pt. Staff is looking for fetal doppler at this time. ? ?Pt is teary. VS obtained. ?

## 2021-12-28 NOTE — ED Notes (Signed)
Called L&D to give report and find out which room pt being admitted to. Spoke with Marijean Niemann, RN. Pt will go to observation room 1. She will call this RN right back once other pt moved otu of room next to obs room 1. ?

## 2021-12-28 NOTE — ED Notes (Signed)
Fluid noted on chux below pt buttocks. Large stain of fluid, noted to be straw colored. ?

## 2021-12-28 NOTE — Progress Notes (Addendum)
?   12/28/21 2000  ?Clinical Encounter Type  ?Visited With Patient and family together  ?Visit Type Initial  ?Referral From Nurse  ?Consult/Referral To Chaplain  ? ?Chaplain responded to nurse page. Chaplain visited with patient and husband. Chaplain said a prayer prior to induction procedure as requested by family. Chaplain also provided a prayer shawl. Patient and husband teary and sad amid difficult diagnosis for baby Veronica Hendrix. Patient and family appreciated Chaplain visit. ?

## 2021-12-28 NOTE — ED Notes (Signed)
First RN note: ? ?Pt comes into the ED stating "Im 16 weeks and I think my water just broke".  Pt denies any vaginal bleeding, but states she is leaking fluid.  ?

## 2021-12-28 NOTE — ED Notes (Signed)
Provider at beside to perform speculum exam. ?

## 2021-12-28 NOTE — ED Notes (Signed)
L&D RN called, back, they area ready for pt to come to observ room 1. ?

## 2021-12-29 ENCOUNTER — Encounter: Payer: Self-pay | Admitting: *Deleted

## 2021-12-29 DIAGNOSIS — Z3A16 16 weeks gestation of pregnancy: Secondary | ICD-10-CM | POA: Diagnosis not present

## 2021-12-29 DIAGNOSIS — O09292 Supervision of pregnancy with other poor reproductive or obstetric history, second trimester: Secondary | ICD-10-CM | POA: Diagnosis not present

## 2021-12-29 DIAGNOSIS — O4290 Premature rupture of membranes, unspecified as to length of time between rupture and onset of labor, unspecified weeks of gestation: Secondary | ICD-10-CM

## 2021-12-29 LAB — RESP PANEL BY RT-PCR (FLU A&B, COVID) ARPGX2
Influenza A by PCR: NEGATIVE
Influenza B by PCR: NEGATIVE
SARS Coronavirus 2 by RT PCR: NEGATIVE

## 2021-12-29 LAB — RPR: RPR Ser Ql: NONREACTIVE

## 2021-12-29 MED ORDER — DOCUSATE SODIUM 100 MG PO CAPS: 100.0000 mg | ORAL_CAPSULE | Freq: Two times a day (BID) | ORAL | Status: DC

## 2021-12-29 MED ORDER — OXYTOCIN-SODIUM CHLORIDE 30-0.9 UT/500ML-% IV SOLN: 2.5000 [IU]/h | INTRAVENOUS | Status: DC | PRN

## 2021-12-29 MED ORDER — IBUPROFEN 600 MG PO TABS
600.0000 mg | ORAL_TABLET | Freq: Four times a day (QID) | ORAL | Status: DC
  Administered 2021-12-29: 600 mg via ORAL
  Filled 2021-12-29: qty 1

## 2021-12-29 MED ORDER — ZOLPIDEM TARTRATE 5 MG PO TABS: 5.0000 mg | ORAL_TABLET | Freq: Every evening | ORAL | Status: DC | PRN

## 2021-12-29 MED ORDER — SIMETHICONE 80 MG PO CHEW
80.0000 mg | CHEWABLE_TABLET | ORAL | Status: DC | PRN
Start: 2021-12-29 — End: 2021-12-29

## 2021-12-29 MED ORDER — DIPHENHYDRAMINE HCL 25 MG PO CAPS: 25.0000 mg | ORAL_CAPSULE | Freq: Four times a day (QID) | ORAL | Status: DC | PRN

## 2021-12-29 MED ORDER — TETANUS-DIPHTH-ACELL PERTUSSIS 5-2.5-18.5 LF-MCG/0.5 IM SUSY
0.5000 mL | PREFILLED_SYRINGE | Freq: Once | INTRAMUSCULAR | Status: DC
  Filled 2021-12-29: qty 0.5

## 2021-12-29 MED ORDER — ACETAMINOPHEN 325 MG PO TABS
650.0000 mg | ORAL_TABLET | ORAL | Status: DC | PRN
  Administered 2021-12-29: 650 mg via ORAL
  Filled 2021-12-29: qty 2

## 2021-12-29 MED ORDER — IBUPROFEN 600 MG PO TABS: 600.0000 mg | ORAL_TABLET | Freq: Four times a day (QID) | ORAL | 0 refills | Status: DC

## 2021-12-29 MED ORDER — OXYCODONE-ACETAMINOPHEN 5-325 MG PO TABS: 1.0000 | ORAL_TABLET | ORAL | Status: DC | PRN

## 2021-12-29 MED ORDER — BENZOCAINE-MENTHOL 20-0.5 % EX AERO: 1.0000 "application " | INHALATION_SPRAY | CUTANEOUS | Status: DC | PRN

## 2021-12-29 MED ORDER — PRENATAL MULTIVITAMIN CH: 1.0000 | ORAL_TABLET | Freq: Every day | ORAL | Status: DC

## 2021-12-29 NOTE — Discharge Summary (Signed)
? ?   ?Patient Name: Veronica Hendrix ?DOB: July 10, 1986 ?MRN: [id number] ? ?                          Discharge Summary ? ?Date of Admission: 12/28/2021 ?Date of Discharge: 12/29/2021 ?Delivering Provider: Linzie Collin  ? ?Admitting Diagnosis: Preterm premature rupture of membranes, unspecified duration to onset of labor [O42.919] ?Premature rupture of membranes in pregnancy [O42.90] at [redacted]w[redacted]d ?Secondary diagnosis:  Active Problems: ?  Premature rupture of membranes in pregnancy ? ?Mode of Delivery: misoprostol augmented vaginal delivery at 16 weeks          ?    ?Discharge diagnosis: Preterm Pregnancy Delivered  after PPROM ?  ?Post partum procedures: None ? ?Complications: none ? ?                   Discharge Day SOAP Note: ? ?Progress Note - Vaginal Delivery ? ?Veronica Hendrix is a 36 y.o. [id number] now PP day 1/2 s/p Vaginal, Spontaneous . Delivery was comlicated by PPROM at [redacted] weeks gestation.  Pt had a h/o subchorionic hemorrhage. ? ?Subjective ? ?The patient has the following complaints: has no unusual complaints ? Pain is controlled with current medications.   Patient is urinating without difficulty.  She is ambulating well.   ?She is specifically requesting discharge. ? ?Objective ? ?Vital signs: ?BP 118/69   Pulse 84   Temp 98.9 ?F (37.2 ?C) (Oral)   Resp 14   Ht 5\' 5"  (1.651 m)   Wt 99.3 kg   LMP 09/06/2021   SpO2 98%   Breastfeeding Unknown   BMI 36.44 kg/m?  ? ?Physical Exam: ?Gen: NAD ?Fundus Fundal Tone: Firm  ?Lochia Amount: Scant  ?   ? ?  ?Data Review ?Labs: ?Lab Results  ?Component Value Date  ? WBC 13.5 (H) 12/28/2021  ? HGB 12.0 12/28/2021  ? HCT 35.6 (L) 12/28/2021  ? MCV 87.9 12/28/2021  ? PLT 291 12/28/2021  ? ?CBC Latest Ref Rng & Units 12/28/2021 12/28/2021 11/07/2021  ?WBC 4.0 - 10.5 K/uL 13.5(H) 10.7(H) 12.9(H)  ?Hemoglobin 12.0 - 15.0 g/dL 11/09/2021 11.5(L) 12.9  ?Hematocrit 36.0 - 46.0 % 35.6(L) 33.5(L) 37.9  ?Platelets 150 - 400 K/uL 291 287 324  ? ?O POS ? ?Edinburgh Score: ?Edinburgh  Postnatal Depression Scale Screening Tool 04/26/2021  ?I have been able to laugh and see the funny side of things. 1  ?I have looked forward with enjoyment to things. 2  ?I have blamed myself unnecessarily when things went wrong. 3  ?I have been anxious or worried for no good reason. 3  ?I have felt scared or panicky for no good reason. 2  ?Things have been getting on top of me. 2  ?I have been so unhappy that I have had difficulty sleeping. 2  ?I have felt sad or miserable. 2  ?I have been so unhappy that I have been crying. 2  ?The thought of harming myself has occurred to me. 0  ?Edinburgh Postnatal Depression Scale Total 19  ? ? ?Assessment/Plan ? ?Active Problems: ?  Premature rupture of membranes in pregnancy ?  ? ?Plan for discharge today. ? ?Discharge Instructions: Per After Visit Summary. ?Activity: Advance as tolerated. Pelvic rest for 6 weeks.  Also refer to After Visit Summary ?Diet: Regular ?Medications: ?Allergies as of 12/29/2021   ?No Known Allergies ?  ? ?  ?Medication List  ?  ? ?STOP taking these  medications   ? ?busPIRone 15 MG tablet ?Commonly known as: BUSPAR ?  ?furosemide 20 MG tablet ?Commonly known as: Lasix ?  ?loratadine 10 MG tablet ?Commonly known as: CLARITIN ?  ?NIFEdipine 30 MG 24 hr tablet ?Commonly known as: ADALAT CC ?  ?QUEtiapine 25 MG tablet ?Commonly known as: SEROQUEL ?  ?sertraline 50 MG tablet ?Commonly known as: Zoloft ?  ? ?  ? ?TAKE these medications   ? ?ibuprofen 600 MG tablet ?Commonly known as: ADVIL ?Take 1 tablet (600 mg total) by mouth every 6 (six) hours. ?What changed:  ?when to take this ?reasons to take this ?  ?PRENATAL VITAMIN PO ?Take 1 tablet by mouth daily. ?  ? ?  ? ?Outpatient follow up:  ? Follow-up Information   ? ? Linzie Collin, MD Follow up in 2 week(s).   ?Specialties: Obstetrics and Gynecology, Radiology ?Contact information: ?[address] ?[address] ?[place] [address] ?[phone] ? ? ?  ?  ? ? [facility] Follow up in 2  week(s).   ?Contact information: ?[address] ?[place] [address] ?[phone] ? ? ?  ?  ? ?  ?  ? ?  ? ?Postpartum contraception: Will discuss at first office visit post-partum ? ?Discharged Condition: good ? ?Discharged to: home ? ?Newborn Data: ? ?Disposition: [redacted] week gestation ? ?  ?Elonda Husky, M.D. ?12/29/2021 ?9:28 AM ? ?

## 2021-12-29 NOTE — Plan of Care (Signed)
Patient discharge instructions gone over with patient and significant other. Discharge papers printed and sent with patient at discharge. Patient instructed to call for follow up appointment to be scheduled.  Questions answered and instructions to reach out to on call provider if new questions or concerns occur.   ?

## 2021-12-29 NOTE — Discharge Instructions (Signed)
Please call for follow up appointment as instructed by Dr .Amalia Hailey.  If you have questions or concerns you may call the on call provider.  If you have urgent concerns please go to the nearest emergency department for evaluation.   ?

## 2021-12-29 NOTE — H&P (Signed)
History and Physical   HPI  Veronica Hendrix is a 36 y.o. [id number] at [redacted]w[redacted]d Estimated Date of Delivery: 06/13/22 who is being admitted for PPROM. Confirmed by speculum examination and by FU [place].  Cervix @ 3 cm dilated.  Significant history includes pt being followed at [facility] for subchorionic hemorrhage and prenatal care.   OB History  OB History  Gravida Para Term Preterm AB Living  0 3  SAB IAB Ectopic Multiple Live Births  0 0 0 0 3    # Outcome Date GA Lbr Len/2nd Weight Sex Delivery Anes PTL Lv  4 Gravida           3 Term 04/16/21 [redacted]w[redacted]d  3105 g M Vag-Spont None  LIV     Name: PULLIAM,BOY Gwenith     Apgar1: 3  Apgar5: 6  2 Preterm 12/15/19 [redacted]w[redacted]d  2381 g M Vag-Spont EPI N LIV     Complications: Pre-eclampsia, Gestational diabetes  1 Term 09/24/11 [redacted]w[redacted]d  3402 g M Vag-Spont EPI N LIV    PROBLEM LIST  Pregnancy complications or risks: Patient Active Problem List   Diagnosis Date Noted   Premature rupture of membranes in pregnancy 12/28/2021   Gestational hypertension 04/16/2021   Gestational hypertension, third trimester 04/10/2021   Polyhydramnios 03/20/2021   Gestational diabetes mellitus, class A2 02/20/2021   Rubella non-immune status, antepartum 11/07/2020   History of gestational diabetes in prior pregnancy, currently pregnant 11/02/2020   History of pre-eclampsia in prior pregnancy, currently pregnant 09/13/2020   History of postpartum depression, currently pregnant 05/31/2019   Anxiety state 01/01/2017   Moderate episode of recurrent major depressive disorder (HCC) 10/03/2016   Idiopathic intracranial hypertension 10/02/2016   Acute back pain 10/02/2016   Papilledema of both eyes 08/06/2016   Headache 08/06/2016   Visual disturbance 08/06/2016   Snoring 08/06/2016   OSA (obstructive sleep apnea) 08/06/2016   Excessive daytime sleepiness 08/06/2016   Insomnia 08/06/2016   Bipolar II disorder (HCC) 11/08/2015    Prenatal labs and studies: ABO, Rh:  --/--/O POS (03/10 2023) Antibody: NEG (03/10 2023) Rubella:   RPR: NON REACTIVE (06/27 0042)  HBsAg:    HIV: Non Reactive (05/02 0839)  AVW:UJWJXBJY/-- (06/21 1400)  CLINICAL DATA:  Leakage of amniotic fluid since 2 p.m. Assigned gestational age is 16 weeks 1 day.   EXAM: LIMITED OBSTETRIC ULTRASOUND   COMPARISON:  11/07/2021   FINDINGS: Number of Fetuses: 1   Heart Rate:  164 bpm   Movement: No fetal movement is observed.   Presentation: Breech   Placental Location: Posterior   Previa: Not able to visualize the internal os.  Indeterminate.   Amniotic Fluid (Subjective):  Decreased   AFI: 3.3 cm   BPD: 3.4 cm 16 w  3 d   MATERNAL FINDINGS:   Cervix:  Appears closed.   Uterus/Adnexae: Limited visualization. No acute abnormality is indicated.   IMPRESSION: 1. Decreased amniotic fluid with amniotic fluid index 3.3 cm. 2. Fetal cardiac activity is present but no fetal movement is observed. 3. Obstetric referral for follow-up complete ultrasound is recommended.   This exam is performed on an emergent basis and does not comprehensively evaluate fetal size, dating, or anatomy; follow-up complete OB US should be considered if further fetal assessment is warranted.      Past Medical History:  Diagnosis Date   Bipolar 1 disorder (HCC)    Depression    Headache    History of pre-eclampsia  in prior pregnancy, currently pregnant    HPV in female    Panic attacks    Post traumatic stress disorder (PTSD)    Vision abnormalities      Past Surgical History:  Procedure Laterality Date   BLOOD PATCH     CHOLECYSTECTOMY N/A 05/2021     Medications    Current Discharge Medication List     CONTINUE these medications which have CHANGED   Details  ibuprofen (ADVIL) 600 MG tablet Take 1 tablet (600 mg total) by mouth every 6 (six) hours. Qty: 30 tablet, Refills: 0       CONTINUE these medications which have NOT CHANGED   Details  furosemide (LASIX) 20  MG tablet Take 1 tablet (20 mg total) by mouth daily. Qty: 6 tablet, Refills: 0    loratadine (CLARITIN) 10 MG tablet Take 10 mg by mouth daily.    Prenatal Vit-Fe Fumarate-FA (PRENATAL VITAMIN PO) Take 1 tablet by mouth daily.    sertraline (ZOLOFT) 50 MG tablet Take 1 tablet (50 mg total) by mouth daily. Qty: 90 tablet, Refills: 2       STOP taking these medications     busPIRone (BUSPAR) 15 MG tablet Comments:  Reason for Stopping:       QUEtiapine (SEROQUEL) 25 MG tablet Comments:  Reason for Stopping:       NIFEdipine (ADALAT CC) 30 MG 24 hr tablet Comments:  Reason for Stopping:           Allergies  Patient has no known allergies.  Review of Systems  Pertinent items noted in HPI and remainder of comprehensive ROS otherwise negative.  Physical Exam  BP 118/69    Pulse 84    Temp 98.9 F (37.2 C) (Oral)    Resp 14    Ht 5\' 5"  (1.651 m)    Wt 99.3 kg    LMP 09/06/2021    SpO2 98%    Breastfeeding Unknown    BMI 36.44 kg/m   Lungs:  CTA B Cardio: RRR without M/R/G Abd: Soft, gravid, NT Presentation: breech      Test Results  Results for orders placed or performed during the hospital encounter of 12/28/21 (from the past 24 hour(s))  Basic metabolic panel     Status: Abnormal   Collection Time: 12/28/21  2:03 PM  Result Value Ref Range   Sodium 135 135 - 145 mmol/L   Potassium 3.9 3.5 - 5.1 mmol/L   Chloride 106 98 - 111 mmol/L   CO2 22 22 - 32 mmol/L   Glucose, Bld 111 (H) 70 - 99 mg/dL   BUN 8 6 - 20 mg/dL   Creatinine, Ser 02/27/22 0.44 - 1.00 mg/dL   Calcium 9.1 8.9 - 9.35 mg/dL   GFR, Estimated 70.1 >77 mL/min   Anion gap 7 5 - 15  CBC with Differential     Status: Abnormal   Collection Time: 12/28/21  2:03 PM  Result Value Ref Range   WBC 10.7 (H) 4.0 - 10.5 K/uL   RBC 3.85 (L) 3.87 - 5.11 MIL/uL   Hemoglobin 11.5 (L) 12.0 - 15.0 g/dL   HCT 02/27/22 (L) 90.3 - 00.9 %   MCV 87.0 80.0 - 100.0 fL   MCH 29.9 26.0 - 34.0 pg   MCHC 34.3 30.0 - 36.0 g/dL    RDW 23.3 00.7 - 62.2 %   Platelets 287 150 - 400 K/uL   nRBC 0.0 0.0 - 0.2 %   Neutrophils Relative %  70 %   Neutro Abs 7.4 1.7 - 7.7 K/uL   Lymphocytes Relative 21 %   Lymphs Abs 2.3 0.7 - 4.0 K/uL   Monocytes Relative 4 %   Monocytes Absolute 0.4 0.1 - 1.0 K/uL   Eosinophils Relative 4 %   Eosinophils Absolute 0.5 0.0 - 0.5 K/uL   Basophils Relative 0 %   Basophils Absolute 0.0 0.0 - 0.1 K/uL   Immature Granulocytes 1 %   Abs Immature Granulocytes 0.05 0.00 - 0.07 K/uL  Type and screen [facility]     Status: None   Collection Time: 12/28/21  2:03 PM  Result Value Ref Range   ABO/RH(D) O POS    Antibody Screen NEG    Sample Expiration      12/31/2021,2359 Performed at [facility], [address] [place], [place] [address]   CBC     Status: Abnormal   Collection Time: 12/28/21  8:23 PM  Result Value Ref Range   WBC 13.5 (H) 4.0 - 10.5 K/uL   RBC 4.05 3.87 - 5.11 MIL/uL   Hemoglobin 12.0 12.0 - 15.0 g/dL   HCT 76.2 (L) 26.3 - 33.5 %   MCV 87.9 80.0 - 100.0 fL   MCH 29.6 26.0 - 34.0 pg   MCHC 33.7 30.0 - 36.0 g/dL   RDW 45.6 25.6 - 38.9 %   Platelets 291 150 - 400 K/uL   nRBC 0.0 0.0 - 0.2 %  Type and screen [facility]     Status: None   Collection Time: 12/28/21  8:23 PM  Result Value Ref Range   ABO/RH(D) O POS    Antibody Screen NEG    Sample Expiration      12/31/2021,2359 Performed at [facility], [address] [place], [place] [address]   Resp Panel by RT-PCR (Flu A&B, Covid) Nasopharyngeal Swab     Status: None   Collection Time: 12/29/21 12:29 AM   Specimen: Nasopharyngeal Swab; Nasopharyngeal(NP) swabs in vial transport medium  Result Value Ref Range   SARS Coronavirus 2 by RT PCR NEGATIVE NEGATIVE   Influenza A by PCR NEGATIVE NEGATIVE   Influenza B by PCR NEGATIVE NEGATIVE     Assessment   G4P2103 at [redacted]w[redacted]d Estimated Date of Delivery: 06/13/22   PPROM - confirmed  Patient Active  Problem List   Diagnosis Date Noted   Premature rupture of membranes in pregnancy 12/28/2021   Gestational hypertension 04/16/2021   Gestational hypertension, third trimester 04/10/2021   Polyhydramnios 03/20/2021   Gestational diabetes mellitus, class A2 02/20/2021   Rubella non-immune status, antepartum 11/07/2020   History of gestational diabetes in prior pregnancy, currently pregnant 11/02/2020   History of pre-eclampsia in prior pregnancy, currently pregnant 09/13/2020   History of postpartum depression, currently pregnant 05/31/2019   Anxiety state 01/01/2017   Moderate episode of recurrent major depressive disorder (HCC) 10/03/2016   Idiopathic intracranial hypertension 10/02/2016   Acute back pain 10/02/2016   Papilledema of both eyes 08/06/2016   Headache 08/06/2016   Visual disturbance 08/06/2016   Snoring 08/06/2016   OSA (obstructive sleep apnea) 08/06/2016   Excessive daytime sleepiness 08/06/2016   Insomnia 08/06/2016   Bipolar II disorder (HCC) 11/08/2015    Plan  1. Admit to L&D :   2. Discussed PPROM and [redacted]wk EGA especially in regard to non-viable status of fetus. 3. Option of brief expectant management vs miso augmentation discussed.  She has elected to have miso augmentation.  I  spent 85 minutes involved in the care of this patient preparing to see the patient by obtaining and reviewing her medical history (including labs, imaging tests and prior procedures), documenting clinical information in the electronic health record (EHR), counseling and coordinating care plans, writing and sending prescriptions, ordering tests or procedures and in direct communicating with the patient and medical staff discussing pertinent items from her history and physical exam.  Elonda Huskyavid J. Nadege Carriger, M.D. 12/29/2021 9:06 AM

## 2022-06-03 ENCOUNTER — Ambulatory Visit
Admission: EM | Admit: 2022-06-03 | Discharge: 2022-06-03 | Disposition: A | Discharge status: Home / Self Care | Payer: Medicaid Other | Attending: Nurse Practitioner | Admitting: Nurse Practitioner

## 2022-06-03 DIAGNOSIS — Z3202 Encounter for pregnancy test, result negative: Secondary | ICD-10-CM | POA: Diagnosis not present

## 2022-06-03 LAB — POCT URINE PREGNANCY: Preg Test, Ur: NEGATIVE

## 2022-06-03 NOTE — ED Triage Notes (Signed)
Pt is need of a pregnancy test to be able to follow up with her doctor. The one at home says invalid. Reports feeling pregnant.

## 2022-06-03 NOTE — Discharge Instructions (Signed)
-   Pregnancy test is negative today -Follow-up with OB/GYN if your symptoms persist

## 2022-06-03 NOTE — ED Provider Notes (Signed)
[facility]    CSN: [id number] Arrival date & time: 06/03/22  1653      History   Chief Complaint Chief Complaint  Patient presents with   Possible Pregnancy    HPI Veronica Hendrix is a 36 y.o. female.   Patient presents with missed menstrual cycle, breast enlargement, weight gain for the past couple of months.  She wants to be tested for pregnancy.  Reports her test at home inconclusive.  Reports her menstrual cycles are normally regular, short in duration.    Past Medical History:  Diagnosis Date   Bipolar 1 disorder (HCC)    Depression    Headache    History of pre-eclampsia in prior pregnancy, currently pregnant    HPV in female    Panic attacks    Post traumatic stress disorder (PTSD)    Vision abnormalities     Patient Active Problem List   Diagnosis Date Noted   Premature rupture of membranes in pregnancy 12/28/2021   Gestational hypertension 04/16/2021   Gestational hypertension, third trimester 04/10/2021   Polyhydramnios 03/20/2021   Gestational diabetes mellitus, class A2 02/20/2021   Rubella non-immune status, antepartum 11/07/2020   History of gestational diabetes in prior pregnancy, currently pregnant 11/02/2020   History of pre-eclampsia in prior pregnancy, currently pregnant 09/13/2020   History of postpartum depression, currently pregnant 05/31/2019   Anxiety state 01/01/2017   Moderate episode of recurrent major depressive disorder (HCC) 10/03/2016   Idiopathic intracranial hypertension 10/02/2016   Acute back pain 10/02/2016   Papilledema of both eyes 08/06/2016   Headache 08/06/2016   Visual disturbance 08/06/2016   Snoring 08/06/2016   OSA (obstructive sleep apnea) 08/06/2016   Excessive daytime sleepiness 08/06/2016   Insomnia 08/06/2016   Bipolar II disorder (HCC) 11/08/2015    Past Surgical History:  Procedure Laterality Date   BLOOD PATCH     CHOLECYSTECTOMY N/A 05/2021    OB History     Gravida  4   Para  4    Term  2   Preterm  1   AB  0   Living  3      SAB  0   IAB  0   Ectopic  0   Multiple  0   Live Births  3            Home Medications    Prior to Admission medications   Medication Sig Start Date End Date Taking? Authorizing Provider  ibuprofen (ADVIL) 600 MG tablet Take 1 tablet (600 mg total) by mouth every 6 (six) hours. 12/29/21   Linzie Collin, MD  Prenatal Vit-Fe Fumarate-FA (PRENATAL VITAMIN PO) Take 1 tablet by mouth daily. Patient not taking: Reported on 09/10/2021    [provider]    Family History Family History  Problem Relation Age of Onset   Diabetes Mother    Depression Mother    Mental illness Father    Hypertension Father    Depression Father    Anxiety disorder Father    Bipolar disorder Father    Heart disease Maternal Grandmother    Heart disease Maternal Grandfather    Heart disease Paternal Grandfather    Hydrocephalus Son     Social History Social History   Tobacco Use   Smoking status: Never   Smokeless tobacco: Never  Vaping Use   Vaping Use: Never used  Substance Use Topics   Alcohol use: No   Drug use: No  Allergies   Patient has no known allergies.   Review of Systems Review of Systems Per HPI  Physical Exam Triage Vital Signs ED Triage Vitals  Enc Vitals Group     BP 06/03/22 1705 131/83     Pulse Rate 06/03/22 1705 83     Resp 06/03/22 1705 19     Temp 06/03/22 1705 99.1 F (37.3 C)     Temp src --      SpO2 06/03/22 1705 98 %     Weight --      Height --      Head Circumference --      Peak Flow --      Pain Score 06/03/22 1703 0     Pain Loc --      Pain Edu? --      Excl. in GC? --    No data found.  Updated Vital Signs BP 131/83   Pulse 83   Temp 99.1 F (37.3 C)   Resp 19   LMP 04/14/2022   SpO2 98%   Visual Acuity Right Eye Distance:   Left Eye Distance:   Bilateral Distance:    Right Eye Near:   Left Eye Near:    Bilateral Near:     Physical  Exam Vitals and nursing note reviewed.  Constitutional:      General: She is not in acute distress.    Appearance: Normal appearance. She is not toxic-appearing.  HENT:     Mouth/Throat:     Mouth: Mucous membranes are moist.     Pharynx: Oropharynx is clear.  Pulmonary:     Effort: Pulmonary effort is normal. No respiratory distress.  Skin:    Coloration: Skin is not jaundiced or pale.     Findings: No erythema.  Neurological:     Mental Status: She is alert and oriented to person, place, and time.  Psychiatric:        Behavior: Behavior is cooperative.      UC Treatments / Results  Labs (all labs ordered are listed, but only abnormal results are displayed) Labs Reviewed  POCT URINE PREGNANCY    EKG   Radiology No results found.  Procedures Procedures (including critical care time)  Medications Ordered in UC Medications - No data to display  Initial Impression / Assessment and Plan / UC Course  I have reviewed the triage vital signs and the nursing notes.  Pertinent labs & imaging results that were available during my care of the patient were reviewed by me and considered in my medical decision making (see chart for details).    Patient is a very pleasant, well-appearing 36 year old female presenting for pregnancy test today.  We discussed that today, her urine pregnancy test is negative.  Discussed follow-up with OB/GYN for ongoing missed menses/other symptoms.  We also discussed that she can return here for repeat pregnancy test in a couple of weeks if she desires.  The patient was given the opportunity to ask questions.  All questions answered to their satisfaction.  The patient is in agreement to this plan.   Final Clinical Impressions(s) / UC Diagnoses   Final diagnoses:  Negative pregnancy test     Discharge Instructions      - Pregnancy test is negative today -Follow-up with OB/GYN if your symptoms persist     ED Prescriptions   None     PDMP not reviewed this encounter.   Valentino Nose, NP 06/03/22 1742

## 2022-06-05 ENCOUNTER — Other Ambulatory Visit: Payer: Medicaid Other

## 2022-06-06 ENCOUNTER — Other Ambulatory Visit: Payer: Medicaid Other

## 2022-06-06 DIAGNOSIS — O2690 Pregnancy related conditions, unspecified, unspecified trimester: Secondary | ICD-10-CM

## 2022-06-07 LAB — BETA HCG QUANT (REF LAB): hCG Quant: <1 m[IU]/mL

## 2022-07-17 ENCOUNTER — Ambulatory Visit (INDEPENDENT_AMBULATORY_CARE_PROVIDER_SITE_OTHER): Payer: Medicaid Other | Admitting: *Deleted

## 2022-07-17 VITALS — BP 108/78 | HR 88 | Ht 65.0 in | Wt 225.0 lb

## 2022-07-17 DIAGNOSIS — Z3201 Encounter for pregnancy test, result positive: Secondary | ICD-10-CM | POA: Diagnosis not present

## 2022-07-17 DIAGNOSIS — N926 Irregular menstruation, unspecified: Secondary | ICD-10-CM | POA: Diagnosis not present

## 2022-07-17 LAB — POCT URINE PREGNANCY: Preg Test, Ur: POSITIVE — AB

## 2022-07-17 NOTE — Progress Notes (Signed)
   NURSE VISIT- PREGNANCY CONFIRMATION   SUBJECTIVE:  Veronica Hendrix is a 36 y.o. G9P2103 female at [redacted]w[redacted]d by certain LMP of Patient's last menstrual period was 06/09/2022 (approximate). Here for pregnancy confirmation.  Home pregnancy test: positive x 2   She reports nausea and cramping.  She is not taking prenatal vitamins.    OBJECTIVE:  BP 108/78 (BP Location: Right Arm, Patient Position: Sitting, Cuff Size: Large)   Pulse 88   Ht 5\' 5"  (1.651 m)   Wt 225 lb (102.1 kg)   LMP 06/09/2022 (Approximate)   Breastfeeding No   BMI 37.44 kg/m   Appears well, in no apparent distress  Results for orders placed or performed in visit on 07/17/22 (from the past 24 hour(s))  POCT urine pregnancy   Collection Time: 07/17/22 10:19 AM  Result Value Ref Range   Preg Test, Ur Positive (A) Negative    ASSESSMENT: Positive pregnancy test, [redacted]w[redacted]d by LMP    PLAN: Schedule for dating ultrasound in 3 weeks Prenatal vitamins: plans to begin OTC ASAP   Nausea medicines: not currently needed   OB packet given: Yes  Andrez Grime  07/17/2022 10:19 AM

## 2022-08-01 ENCOUNTER — Other Ambulatory Visit: Payer: Self-pay | Admitting: Obstetrics & Gynecology

## 2022-08-01 DIAGNOSIS — O3680X0 Pregnancy with inconclusive fetal viability, not applicable or unspecified: Secondary | ICD-10-CM

## 2022-08-05 ENCOUNTER — Ambulatory Visit (INDEPENDENT_AMBULATORY_CARE_PROVIDER_SITE_OTHER): Payer: Medicaid Other

## 2022-08-05 ENCOUNTER — Encounter: Payer: Self-pay | Admitting: *Deleted

## 2022-08-05 ENCOUNTER — Ambulatory Visit (INDEPENDENT_AMBULATORY_CARE_PROVIDER_SITE_OTHER): Payer: Medicaid Other | Admitting: *Deleted

## 2022-08-05 VITALS — BP 113/78 | HR 78 | Wt 230.0 lb

## 2022-08-05 DIAGNOSIS — Z3490 Encounter for supervision of normal pregnancy, unspecified, unspecified trimester: Secondary | ICD-10-CM | POA: Insufficient documentation

## 2022-08-05 DIAGNOSIS — O3680X0 Pregnancy with inconclusive fetal viability, not applicable or unspecified: Secondary | ICD-10-CM

## 2022-08-05 DIAGNOSIS — Z3A08 8 weeks gestation of pregnancy: Secondary | ICD-10-CM

## 2022-08-05 DIAGNOSIS — O0990 Supervision of high risk pregnancy, unspecified, unspecified trimester: Secondary | ICD-10-CM | POA: Insufficient documentation

## 2022-08-05 DIAGNOSIS — Z3480 Encounter for supervision of other normal pregnancy, unspecified trimester: Secondary | ICD-10-CM

## 2022-08-05 NOTE — Progress Notes (Signed)
[place] 8+1 wks,single IUP with yolk sac,FHR 171 bpm,normal left ovary,2.2 x 2.1 x 1.5 cm complex right corpus luteal cyst

## 2022-08-05 NOTE — Progress Notes (Cosign Needed)
   INITIAL OB NURSE INTAKE  SUBJECTIVE:  Veronica Hendrix is a 36 y.o. [phone] female [redacted]w[redacted]d by LMP c/w today's U/S with an Estimated Date of Delivery: 03/16/23 being seen today for her initial OB intake/educational visit with RN. She is not taking prenatal vitamins.  She is having nausea and/or vomiting and does not request nausea meds at this time.  Patient's last menstrual period was 06/09/2022 (approximate).  Patient's medical, surgical, and obstetrical history obtained and reviewed.  Current medications reviewed. Questionable medicines were reviewed with a provider.   Patient Active Problem List   Diagnosis Date Noted   Encounter for supervision of normal pregnancy, antepartum 08/05/2022   Gestational hypertension, third trimester 04/10/2021   Rubella non-immune status, antepartum 11/07/2020   History of gestational diabetes in prior pregnancy, currently pregnant 11/02/2020   History of pre-eclampsia in prior pregnancy, currently pregnant 09/13/2020   History of postpartum depression, currently pregnant 05/31/2019   Anxiety state 01/01/2017   Moderate episode of recurrent major depressive disorder ([place]) 10/03/2016   Idiopathic intracranial hypertension 10/02/2016   Acute back pain 10/02/2016   Papilledema of both eyes 08/06/2016   Headache 08/06/2016   Visual disturbance 08/06/2016   Snoring 08/06/2016   OSA (obstructive sleep apnea) 08/06/2016   Excessive daytime sleepiness 08/06/2016   Insomnia 08/06/2016   Bipolar II disorder ([place]) 11/08/2015   Past Medical History:  Diagnosis Date   Bipolar 1 disorder ([place])    Depression    Headache    History of pre-eclampsia in prior pregnancy, currently pregnant    HPV in female    Panic attacks    Post traumatic stress disorder (PTSD)    Vision abnormalities    Past Surgical History:  Procedure Laterality Date   BLOOD PATCH     CHOLECYSTECTOMY N/A 05/2021   OB History     Gravida  5   Para  4   Term  2   Preterm  1    AB  0   Living  3      SAB  0   IAB  0   Ectopic  0   Multiple  0   Live Births  3           OBJECTIVE:  BP 113/78   Pulse 78   Wt 230 lb (104.3 kg)   LMP 06/09/2022 (Approximate)   BMI 38.27 kg/m   ASSESSMENT/PLAN: [id number] at [redacted]w[redacted]d with an Estimated Date of Delivery: 03/16/23  Prenatal vitamins: plans to begin OTC ASAP   Nausea medicines: not currently needed   OB packet given: Yes BabyScripts and MyChart activated  Blood Pressure Cuff: has at home. Discussed to be used for virtual visits and home BP checks.  Genetic & carrier screening discussed: requests Panorama and NT/IT,  Placed OB Box on problem list and updated Reviewed recommended weight gain based on pre-gravid BMI BMI >=30, gain max 11-20lb  Follow-up in 4 weeks for NT U/S & New OB visit with provider  Face-to-face time at least 30 minutes. 50% or more of this visit was spent in counseling and coordination of care.  Kristeen Miss [name] RN-C 08/05/2022 5:02 PM  Chart and initial new ob nurse intake visit note reviewed and agree with plan of care. OK to continue zoloft & buspar. [place], [facility] 08/06/2022 12:54 PM

## 2022-08-09 ENCOUNTER — Other Ambulatory Visit: Payer: Medicaid Other

## 2022-08-26 ENCOUNTER — Encounter: Payer: Self-pay | Admitting: *Deleted

## 2022-09-04 ENCOUNTER — Other Ambulatory Visit: Payer: Self-pay | Admitting: Obstetrics & Gynecology

## 2022-09-04 DIAGNOSIS — Z3682 Encounter for antenatal screening for nuchal translucency: Secondary | ICD-10-CM

## 2022-09-05 ENCOUNTER — Ambulatory Visit (INDEPENDENT_AMBULATORY_CARE_PROVIDER_SITE_OTHER): Payer: Medicaid Other

## 2022-09-05 ENCOUNTER — Ambulatory Visit (INDEPENDENT_AMBULATORY_CARE_PROVIDER_SITE_OTHER): Payer: Medicaid Other | Admitting: Medical

## 2022-09-05 ENCOUNTER — Encounter: Payer: Self-pay | Admitting: Medical

## 2022-09-05 VITALS — BP 125/79 | HR 86 | Wt 224.0 lb

## 2022-09-05 DIAGNOSIS — G932 Benign intracranial hypertension: Secondary | ICD-10-CM

## 2022-09-05 DIAGNOSIS — Z8632 Personal history of gestational diabetes: Secondary | ICD-10-CM

## 2022-09-05 DIAGNOSIS — R110 Nausea: Secondary | ICD-10-CM

## 2022-09-05 DIAGNOSIS — D6959 Other secondary thrombocytopenia: Secondary | ICD-10-CM

## 2022-09-05 DIAGNOSIS — Z3682 Encounter for antenatal screening for nuchal translucency: Secondary | ICD-10-CM

## 2022-09-05 DIAGNOSIS — Z8659 Personal history of other mental and behavioral disorders: Secondary | ICD-10-CM

## 2022-09-05 DIAGNOSIS — Z87898 Personal history of other specified conditions: Secondary | ICD-10-CM

## 2022-09-05 DIAGNOSIS — F331 Major depressive disorder, recurrent, moderate: Secondary | ICD-10-CM

## 2022-09-05 DIAGNOSIS — O99891 Other specified diseases and conditions complicating pregnancy: Secondary | ICD-10-CM

## 2022-09-05 DIAGNOSIS — O09299 Supervision of pregnancy with other poor reproductive or obstetric history, unspecified trimester: Secondary | ICD-10-CM

## 2022-09-05 DIAGNOSIS — F3181 Bipolar II disorder: Secondary | ICD-10-CM

## 2022-09-05 DIAGNOSIS — Z3A12 12 weeks gestation of pregnancy: Secondary | ICD-10-CM | POA: Diagnosis not present

## 2022-09-05 DIAGNOSIS — Z3480 Encounter for supervision of other normal pregnancy, unspecified trimester: Secondary | ICD-10-CM

## 2022-09-05 DIAGNOSIS — O99119 Other diseases of the blood and blood-forming organs and certain disorders involving the immune mechanism complicating pregnancy, unspecified trimester: Secondary | ICD-10-CM

## 2022-09-05 DIAGNOSIS — Z8759 Personal history of other complications of pregnancy, childbirth and the puerperium: Secondary | ICD-10-CM

## 2022-09-05 MED ORDER — PROMETHAZINE HCL 25 MG PO TABS: 25.0000 mg | ORAL_TABLET | Freq: Four times a day (QID) | ORAL | 1 refills | Status: DC | PRN

## 2022-09-05 NOTE — Patient Instructions (Signed)

## 2022-09-05 NOTE — Progress Notes (Signed)
[place] 12+4 wks,measurements c/w dates,CRL 75.02 mm,FHR 145 bpm,normal ovaries,NB present,NT 1.7 mm,anterior placenta

## 2022-09-06 DIAGNOSIS — D6959 Other secondary thrombocytopenia: Secondary | ICD-10-CM | POA: Insufficient documentation

## 2022-09-06 DIAGNOSIS — Z87898 Personal history of other specified conditions: Secondary | ICD-10-CM | POA: Insufficient documentation

## 2022-09-06 DIAGNOSIS — Z8759 Personal history of other complications of pregnancy, childbirth and the puerperium: Secondary | ICD-10-CM | POA: Insufficient documentation

## 2022-09-06 NOTE — Progress Notes (Signed)
PRENATAL VISIT NOTE  Subjective:  Veronica Hendrix is a 36 y.o. [phone] at [redacted]w[redacted]d being seen today for her first prenatal visit for this pregnancy.  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  Patient reports nausea.  Contractions: Not present. Vag. Bleeding: None.  Movement: Absent. Denies leaking of fluid.   She is planning to  both breast and formula feed . Desires contraception, consider BTL.   The following portions of the patient's history were reviewed and updated as appropriate: allergies, current medications, past family history, past medical history, past social history, past surgical history and problem list.   Objective:   Vitals:   09/05/22 1110  BP: 125/79  Pulse: 86  Weight: 224 lb (101.6 kg)    Fetal Status:     Movement: Absent     General:  Alert, oriented and cooperative. Patient is in no acute distress.  Skin: Skin is warm and dry. No rash noted.   Cardiovascular: Normal heart rate noted  Respiratory: Normal respiratory effort, no problems with respiration noted.   Abdomen: Soft, gravid, appropriate for gestational age. Non-tender. Pain/Pressure: Absent     Pelvic: Cervical exam deferred       Cystocole and rectocele noted with valsalva, no evident of uterine prolapse   Extremities: Normal range of motion.  Edema:  None  Mental Status: Normal mood and affect. Normal behavior. Normal judgment and thought content.   Assessment and Plan:  Pregnancy: [id number] at [redacted]w[redacted]d 1. Supervision of other normal pregnancy, antepartum - Integrated 1 - Hemoglobin A1c - CBC/D/Plt+RPR+Rh+ABO+RubIgG... - GC/Chlamydia Probe Amp - Urine Culture - PANORAMA PRENATAL TEST FULL PANEL - Protein / creatinine ratio, urine - Comprehensive metabolic panel - Flu Vaccine QUAD 36+ mos IM (Fluarix, Quad PF)  2. History of pre-eclampsia in prior pregnancy, currently pregnant - Protein / creatinine ratio, urine - Comprehensive metabolic panel - Discussed with Dr. Despina Hidden, given FNAIT, ASA is not recommended in this pregnancy  3. History of gestational diabetes in prior pregnancy, currently pregnant - Hemoglobin A1c  4. History of gestational hypertension  5. Bipolar II disorder (HCC)  6. History of postpartum depression, currently pregnant - On medication, feels they are helping, already established with a therapist   7. Idiopathic intracranial hypertension - resolved   8. Nausea - promethazine (PHENERGAN) 25 MG tablet; Take 1 tablet (25 mg total) by mouth every 6 (six) hours as needed for nausea or vomiting.  Dispense: 30 tablet; Refill: 1  9. History of preterm premature rupture of membranes (PPROM) - at 16 weeks with last pregnancy, cause unknown  - Plan for cervical length at 16-18 weeks  10. 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 Despina Hidden who has also spoke with the patient - Recommended patient set up consult with MFM at [facility], they would do anatomy and follow-up US, confirm plan for IVIG infusions and any other recommendations  for management and we can coordinate those therapies  - MFM may recommend cord blood sampling for fetal thrombocytopenia testing and possible steroids   11.  History of poor fetal growth - 2nd child with concerns for IUGR born at 14 weeks 5#4oz   47. [redacted] weeks gestation of pregnancy   Preterm labor/first trimester warning symptoms and general obstetric precautions including but not limited to vaginal bleeding, contractions, leaking of fluid and fetal movement were reviewed in detail with the patient. Please refer to After Visit Summary for other counseling recommendations.   Discussed the normal visit cadence for prenatal care Discussed the nature of our practice with multiple providers including residents and students   Return in about 4 weeks (around 10/03/2022) for [facility] MD only, In-Person.  Future Appointments  Date Time Provider [place]  10/03/2022 11:30 AM [facility] - [facility]TOBGYN [place] [facility] None  10/03/2022  1:30 PM Janyth Pupa, DO [facility] FTO[facility]YN    Kerry Hough, PA-C

## 2022-09-07 LAB — COMPREHENSIVE METABOLIC PANEL
ALT: 17 IU/L (ref 0–32)
AST: 15 IU/L (ref 0–40)
Albumin/Globulin Ratio: 1.6 (ref 1.2–2.2)
Albumin: 4.1 g/dL (ref 3.9–4.9)
Alkaline Phosphatase: 101 IU/L (ref 44–121)
BUN/Creatinine Ratio: 11 (ref 9–23)
BUN: 7 mg/dL (ref 6–20)
Bilirubin Total: 0.5 mg/dL (ref 0.0–1.2)
CO2: 22 mmol/L (ref 20–29)
Calcium: 9.2 mg/dL (ref 8.7–10.2)
Chloride: 101 mmol/L (ref 96–106)
Creatinine, Ser: 0.66 mg/dL (ref 0.57–1.00)
Globulin, Total: 2.6 g/dL (ref 1.5–4.5)
Glucose: 95 mg/dL (ref 70–99)
Potassium: 4 mmol/L (ref 3.5–5.2)
Sodium: 138 mmol/L (ref 134–144)
Total Protein: 6.7 g/dL (ref 6.0–8.5)
eGFR: 117 mL/min/{1.73_m2} (ref >59)

## 2022-09-07 LAB — INTEGRATED 1
Crown Rump Length: 75 mm
Gest. Age on Collection Date: 13.3 weeks
Maternal Age at EDD: 35.7 yr
Nuchal Translucency (NT): 1.7 mm
Number of Fetuses: 1
PAPP-A Value: 588.1 ng/mL
Weight: 224 [lb_av]

## 2022-09-07 LAB — CBC/D/PLT+RPR+RH+ABO+RUBIGG...
Antibody Screen: NEGATIVE
Basophils Absolute: 0 10*3/uL (ref 0.0–0.2)
Basos: 0 %
EOS (ABSOLUTE): 0.3 10*3/uL (ref 0.0–0.4)
Eos: 3 %
HCV Ab: NONREACTIVE
HIV Screen 4th Generation wRfx: NONREACTIVE
Hematocrit: 35.7 % (ref 34.0–46.6)
Hemoglobin: 12.5 g/dL (ref 11.1–15.9)
Hepatitis B Surface Ag: NEGATIVE
Immature Grans (Abs): 0 10*3/uL (ref 0.0–0.1)
Immature Granulocytes: 0 %
Lymphocytes Absolute: 1.9 10*3/uL (ref 0.7–3.1)
Lymphs: 23 %
MCH: 30.3 pg (ref 26.6–33.0)
MCHC: 35 g/dL (ref 31.5–35.7)
MCV: 86 fL (ref 79–97)
Monocytes Absolute: 0.3 10*3/uL (ref 0.1–0.9)
Monocytes: 3 %
Neutrophils Absolute: 5.9 10*3/uL (ref 1.4–7.0)
Neutrophils: 71 %
Platelets: 278 10*3/uL (ref 150–450)
RBC: 4.13 x10E6/uL (ref 3.77–5.28)
RDW: 13.4 % (ref 11.7–15.4)
RPR Ser Ql: NONREACTIVE
Rh Factor: POSITIVE
Rubella Antibodies, IGG: 4.25 index (ref >0.99)
WBC: 8.4 10*3/uL (ref 3.4–10.8)

## 2022-09-07 LAB — HEMOGLOBIN A1C
Est. average glucose Bld gHb Est-mCnc: 91 mg/dL
Hgb A1c MFr Bld: 4.8 % (ref 4.8–5.6)

## 2022-09-07 LAB — URINE CULTURE

## 2022-09-07 LAB — PROTEIN / CREATININE RATIO, URINE
Creatinine, Urine: 171.3 mg/dL
Protein, Ur: 33.8 mg/dL
Protein/Creat Ratio: 197 mg/g creat (ref 0–200)

## 2022-09-07 LAB — HCV INTERPRETATION

## 2022-09-08 LAB — GC/CHLAMYDIA PROBE AMP
Chlamydia trachomatis, NAA: NEGATIVE
Neisseria Gonorrhoeae by PCR: NEGATIVE

## 2022-09-12 LAB — PANORAMA PRENATAL TEST FULL PANEL:PANORAMA TEST PLUS 5 ADDITIONAL MICRODELETIONS: FETAL FRACTION: 4.2

## 2022-09-16 ENCOUNTER — Encounter: Payer: Self-pay | Admitting: Medical

## 2022-10-01 ENCOUNTER — Other Ambulatory Visit: Payer: Self-pay | Admitting: Obstetrics & Gynecology

## 2022-10-01 DIAGNOSIS — Z3686 Encounter for antenatal screening for cervical length: Secondary | ICD-10-CM

## 2022-10-02 ENCOUNTER — Other Ambulatory Visit: Payer: Self-pay | Admitting: Obstetrics & Gynecology

## 2022-10-02 ENCOUNTER — Ambulatory Visit (INDEPENDENT_AMBULATORY_CARE_PROVIDER_SITE_OTHER): Payer: Medicaid Other | Admitting: Obstetrics & Gynecology

## 2022-10-02 ENCOUNTER — Ambulatory Visit (INDEPENDENT_AMBULATORY_CARE_PROVIDER_SITE_OTHER): Payer: Medicaid Other

## 2022-10-02 VITALS — BP 135/85 | HR 87 | Wt 222.0 lb

## 2022-10-02 DIAGNOSIS — Z3686 Encounter for antenatal screening for cervical length: Secondary | ICD-10-CM | POA: Diagnosis not present

## 2022-10-02 DIAGNOSIS — Z3A16 16 weeks gestation of pregnancy: Secondary | ICD-10-CM | POA: Diagnosis not present

## 2022-10-02 DIAGNOSIS — O0992 Supervision of high risk pregnancy, unspecified, second trimester: Secondary | ICD-10-CM

## 2022-10-02 DIAGNOSIS — Z1379 Encounter for other screening for genetic and chromosomal anomalies: Secondary | ICD-10-CM

## 2022-10-02 NOTE — Progress Notes (Addendum)
HIGH-RISK PREGNANCY VISIT Patient name: Veronica Hendrix MRN [id number]  Date of birth: 03/26/1986 Chief Complaint:   Routine Prenatal Visit  History of Present Illness:   Veronica Hendrix is a 36 y.o. [phone] female at [redacted]w[redacted]d with an Estimated Date of Delivery: 03/16/23 being seen today for ongoing management of a high-risk pregnancy complicated by  [facility] MFM co-management Per pt awaiting recent lab results regarding management plan  -Anxiety/Dep-on zoloft/buspar  Today she reports no complaints.   Contractions: Not present. Vag. Bleeding: None.  Movement: Present. denies leaking of fluid.      09/05/2022   10:52 AM 02/19/2021    8:57 AM 11/02/2020    2:04 PM 09/13/2020    9:33 AM 05/21/2018    4:12 PM  Depression screen PHQ 2/9  Decreased Interest 2 2 1 1  0  Down, Depressed, Hopeless 2 1 1  0 0  PHQ - 2 Score 4 3 2 1  0  Altered sleeping 2 2 1 1    Tired, decreased energy 2 1 2 1    Change in appetite 2 0 1 1   Feeling bad or failure about yourself  2 0 2 1   Trouble concentrating 1 2 1  0   Moving slowly or fidgety/restless 0 0 0 0   Suicidal thoughts 0 0 0 0   PHQ-9 Score 13 8 9 5       Current Outpatient Medications  Medication Instructions   aspirin EC 81 mg, Oral, Daily, Swallow whole.   busPIRone (BUSPAR) 20 mg, Oral, 3 times daily   Prenatal Vit-Fe Fumarate-FA (PRENATAL VITAMIN PO) 1 tablet, Oral, Daily   promethazine (PHENERGAN) 25 mg, Oral, Every 6 hours PRN     Review of Systems:   Pertinent items are noted in HPI Denies abnormal vaginal discharge w/ itching/odor/irritation, headaches, visual changes, shortness of breath, chest pain, abdominal pain, severe nausea/vomiting, or problems with urination or bowel movements unless otherwise stated above. Pertinent History Reviewed:  Reviewed past medical,surgical, social, obstetrical and family history.  Reviewed problem list, medications and allergies. Physical Assessment:   Vitals:   10/02/22 1628  BP: 135/85   Pulse: 87  Weight: 222 lb (100.7 kg)  Body mass index is 36.94 kg/m.           Physical Examination:   General appearance: alert, well appearing, and in no distress  Mental status: normal mood, behavior, speech, dress, motor activity, and thought processes  Skin: warm & dry   Extremities: Edema: None    Cardiovascular: normal heart rate noted  Respiratory: normal respiratory effort, no distress  Abdomen: gravid, soft, non-tender  Pelvic: Cervical exam deferred         Fetal Status:     Movement: Present    Fetal Surveillance Testing today: 16+3 wks,anterior placenta,FHR 144 bpm,normal ovaries,SVP of fluid 5.4 cm,cervical length 3.9-4.5 cm with and w/o pressure,amnionic sludge     Chaperone: N/A    No results found for this or any previous visit (from the past 24 hour(s)).   Assessment & Plan:  High-risk pregnancy: [id number] at [redacted]w[redacted]d with an Estimated Date of Delivery: 03/16/23   1) Alloimmune thrombocytopenia (FNAIT) -await recommendation from [facility] and results of lab work  2) OB care -IT2 today -[place] today, normal cervical length. Amniotic sludge present- while this can be viewed as a risk for preterm delivery in can also be present in pt with h/o preterm delivery.  No further follow up indicated.  Recommendations for routine OB care/ultrasound.  Anatomy scan  next visit either with [place] or MFM  Meds: No orders of the defined types were placed in this encounter.   Labs/procedures today: IT2 today  Treatment Plan:  continue routine OB care, await recommendations from [facility]  Reviewed: Preterm labor symptoms and general obstetric precautions including but not limited to vaginal bleeding, contractions, leaking of fluid and fetal movement were reviewed in detail with the patient.  All questions were answered. Pt has home bp cuff. Check bp weekly, let us know if >140/90.   Follow-up: Return in about 4 weeks (around 10/30/2022) for HROB visit and anatomy scan.   Future Appointments  Date  Time Provider Department Center  11/01/2022 12:15 PM [facility] - FTO[facility]YN [place] [facility] None  11/01/2022  1:00 PM Myna Hidalgo, DO [facility] FTOBGYN    Orders Placed This Encounter  Procedures   INTEGRATED 2    Myna Hidalgo, DO Attending Obstetrician & Gynecologist, Faculty Practice Center for [employer], [facility]

## 2022-10-02 NOTE — Progress Notes (Signed)
US TV/TA: 16+3 wks,anterior placenta,FHR 144 bpm,normal ovaries,SVP of fluid 5.4 cm,cervical length 3.9-4.5 cm with and w/o pressure,amnionic sludge

## 2022-10-03 ENCOUNTER — Other Ambulatory Visit: Payer: Medicaid Other

## 2022-10-03 ENCOUNTER — Encounter: Payer: Medicaid Other | Admitting: Obstetrics & Gynecology

## 2022-10-05 LAB — INTEGRATED 2
AFP MoM: 0.64
Alpha-Fetoprotein: 17.3 ng/mL
Crown Rump Length: 75 mm
DIA MoM: 1
DIA Value: 119.7 pg/mL
Estriol, Unconjugated: 1.01 ng/mL
Gest. Age on Collection Date: 13.3 weeks
Gestational Age: 17.1 weeks
Maternal Age at EDD: 35.7 yr
Nuchal Translucency (NT): 1.7 mm
Nuchal Translucency MoM: 0.98
Number of Fetuses: 1
PAPP-A MoM: 0.71
PAPP-A Value: 588.1 ng/mL
Test Results:: NEGATIVE
Weight: 224 [lb_av]
Weight: 224 [lb_av]
hCG MoM: 0.73
hCG Value: 16.3 IU/mL
uE3 MoM: 0.95

## 2022-10-16 ENCOUNTER — Telehealth: Payer: Self-pay | Admitting: Obstetrics & Gynecology

## 2022-10-16 NOTE — Telephone Encounter (Signed)
Left message to call back -per MFM needs to start on prednisone @ 20wks []  Rx sent in by MFM or do we need to send in?  Pt hopefully will call back tomorrow with response

## 2022-10-31 ENCOUNTER — Other Ambulatory Visit: Payer: Self-pay | Admitting: Obstetrics & Gynecology

## 2022-10-31 DIAGNOSIS — Z363 Encounter for antenatal screening for malformations: Secondary | ICD-10-CM

## 2022-11-01 ENCOUNTER — Ambulatory Visit (INDEPENDENT_AMBULATORY_CARE_PROVIDER_SITE_OTHER): Payer: Medicaid Other | Admitting: Obstetrics & Gynecology

## 2022-11-01 ENCOUNTER — Ambulatory Visit (INDEPENDENT_AMBULATORY_CARE_PROVIDER_SITE_OTHER): Payer: Medicaid Other

## 2022-11-01 ENCOUNTER — Encounter: Payer: Self-pay | Admitting: Obstetrics & Gynecology

## 2022-11-01 VITALS — BP 121/76 | HR 80 | Wt 229.4 lb

## 2022-11-01 DIAGNOSIS — F3181 Bipolar II disorder: Secondary | ICD-10-CM

## 2022-11-01 DIAGNOSIS — O0992 Supervision of high risk pregnancy, unspecified, second trimester: Secondary | ICD-10-CM

## 2022-11-01 DIAGNOSIS — Z3A20 20 weeks gestation of pregnancy: Secondary | ICD-10-CM

## 2022-11-01 DIAGNOSIS — Z363 Encounter for antenatal screening for malformations: Secondary | ICD-10-CM | POA: Diagnosis not present

## 2022-11-01 DIAGNOSIS — D6959 Other secondary thrombocytopenia: Secondary | ICD-10-CM

## 2022-11-01 DIAGNOSIS — O99119 Other diseases of the blood and blood-forming organs and certain disorders involving the immune mechanism complicating pregnancy, unspecified trimester: Secondary | ICD-10-CM

## 2022-11-01 MED ORDER — PREDNISONE 2.5 MG PO TABS: 2.5000 mg | ORAL_TABLET | Freq: Every day | ORAL | 1 refills | Status: DC

## 2022-11-01 MED ORDER — PREDNISONE 50 MG PO TABS: 50.0000 mg | ORAL_TABLET | Freq: Every day | ORAL | 1 refills | Status: DC

## 2022-11-01 NOTE — Progress Notes (Signed)
HIGH-RISK PREGNANCY VISIT Patient name: Veronica Hendrix MRN [id number]  Date of birth: 1986/02/04 Chief Complaint:   Routine Prenatal Visit  History of Present Illness:   Veronica Hendrix is a 37 y.o. [phone] female at [redacted]w[redacted]d with an Estimated Date of Delivery: 03/16/23 being seen today for ongoing management of a high-risk pregnancy complicated by:  FNAIT- high risk for fetal cranial hemorrhage Per pt she is waiting to hear from [facility] regarding IVIG treatment Additionally they have not started the prednisone  Bipolar- followed by outside facility- plan to start on Rexulti daily Today she reports no complaints.   Contractions: Not present. Vag. Bleeding: None.  Movement: Present. denies leaking of fluid.      09/05/2022   10:52 AM 02/19/2021    8:57 AM 11/02/2020    2:04 PM 09/13/2020    9:33 AM 05/21/2018    4:12 PM  Depression screen PHQ 2/9  Decreased Interest 2 2 1 1  0  Down, Depressed, Hopeless 2 1 1  0 0  PHQ - 2 Score 4 3 2 1  0  Altered sleeping 2 2 1 1    Tired, decreased energy 2 1 2 1    Change in appetite 2 0 1 1   Feeling bad or failure about yourself  2 0 2 1   Trouble concentrating 1 2 1  0   Moving slowly or fidgety/restless 0 0 0 0   Suicidal thoughts 0 0 0 0   PHQ-9 Score 13 8 9 5       Current Outpatient Medications  Medication Instructions   aspirin EC 81 mg, Oral, Daily, Swallow whole.   Prenatal Vit-Fe Fumarate-FA (PRENATAL VITAMIN PO) 1 tablet, Oral, Daily   promethazine (PHENERGAN) 25 mg, Oral, Every 6 hours PRN     Review of Systems:   Pertinent items are noted in HPI Denies abnormal vaginal discharge w/ itching/odor/irritation, headaches, visual changes, shortness of breath, chest pain, abdominal pain, severe nausea/vomiting, or problems with urination or bowel movements unless otherwise stated above. Pertinent History Reviewed:  Reviewed past medical,surgical, social, obstetrical and family history.  Reviewed problem list, medications and  allergies. Physical Assessment:   Vitals:   11/01/22 1248  BP: 121/76  Pulse: 80  Weight: 229 lb 6.4 oz (104.1 kg)  Body mass index is 38.17 kg/m.           Physical Examination:   General appearance: alert, well appearing, and in no distress  Mental status: normal mood, behavior, speech, dress, motor activity, and thought processes  Skin: warm & dry   Extremities:      Cardiovascular: normal heart rate noted  Respiratory: normal respiratory effort, no distress  Abdomen: gravid, soft, non-tender  Pelvic: Cervical exam deferred         Fetal Status:     Movement: Present    Fetal Surveillance Testing today: cephalic,anterior placenta gr 0,fhr 144 bpm,normal ovaries,cx 3.1 cm,SVP of fluid 4.6 cm,EFW 390 g 58%,anatomy complete,no obvious abnormalities    Chaperone: N/A    No results found for this or any previous visit (from the past 24 hour(s)).   Assessment & Plan:  High-risk pregnancy: [id number] at [redacted]w[redacted]d with an Estimated Date of Delivery: 03/16/23   1) FNAIT -plan to start prednisone 52mg  daily -will likely redose q trimester with weight gain -pt to f/u with MFM regarding IVIG -next [place] with [facility] in Feb, continue monthly  2) Bipolar d/o Management per outside provider  Normal anatomy scan today  Meds: No orders of the  defined types were placed in this encounter.   Labs/procedures today: anatomy scan  Treatment Plan:  as outlined above  Reviewed: Preterm labor symptoms and general obstetric precautions including but not limited to vaginal bleeding, contractions, leaking of fluid and fetal movement were reviewed in detail with the patient.  All questions were answered. Pt has home bp cuff. Check bp weekly, let us know if >140/90.   Follow-up: Return in about 4 weeks (around 11/29/2022) for [place] visit.   No future appointments.  No orders of the defined types were placed in this encounter.   Janyth Pupa, DO Attending [place], [facility] for [employer], [place]

## 2022-11-01 NOTE — Progress Notes (Signed)
[place] 56+7 wks,cephalic,anterior placenta gr 0,fhr 144 bpm,normal ovaries,cx 3.1 cm,SVP of fluid 4.6 cm,EFW 390 g 58%,anatomy complete,no obvious abnormalities

## 2022-11-14 ENCOUNTER — Encounter (HOSPITAL_COMMUNITY): Payer: Medicaid Other

## 2022-11-18 ENCOUNTER — Other Ambulatory Visit (HOSPITAL_COMMUNITY): Payer: Self-pay | Admitting: *Deleted

## 2022-11-19 ENCOUNTER — Ambulatory Visit (HOSPITAL_COMMUNITY)
Admission: RE | Admit: 2022-11-19 | Discharge: 2022-11-19 | Disposition: A | Discharge status: Home / Self Care | Payer: Medicaid Other | Source: Ambulatory Visit | Attending: Maternal & Fetal Medicine | Admitting: Maternal & Fetal Medicine

## 2022-11-19 MED ORDER — IMMUNE GLOBULIN (HUMAN) 10 GM/100ML IV SOLN
1.0000 g/kg | INTRAVENOUS | Status: DC
  Administered 2022-11-19: 105 g via INTRAVENOUS
  Filled 2022-11-19: qty 1050

## 2022-11-26 ENCOUNTER — Encounter (HOSPITAL_COMMUNITY)
Admission: RE | Admit: 2022-11-26 | Discharge: 2022-11-26 | Disposition: A | Discharge status: Home / Self Care | Payer: Medicaid Other | Source: Ambulatory Visit | Attending: Maternal & Fetal Medicine | Admitting: Maternal & Fetal Medicine

## 2022-11-26 DIAGNOSIS — O0993 Supervision of high risk pregnancy, unspecified, third trimester: Secondary | ICD-10-CM | POA: Insufficient documentation

## 2022-11-26 DIAGNOSIS — O99113 Other diseases of the blood and blood-forming organs and certain disorders involving the immune mechanism complicating pregnancy, third trimester: Secondary | ICD-10-CM | POA: Insufficient documentation

## 2022-11-26 DIAGNOSIS — Z3A27 27 weeks gestation of pregnancy: Secondary | ICD-10-CM | POA: Diagnosis not present

## 2022-11-26 DIAGNOSIS — D6959 Other secondary thrombocytopenia: Secondary | ICD-10-CM | POA: Insufficient documentation

## 2022-11-26 MED ORDER — IMMUNE GLOBULIN (HUMAN) 10 GM/100ML IV SOLN
1.0000 g/kg | INTRAVENOUS | Status: DC
  Administered 2022-11-26: 105 g via INTRAVENOUS
  Filled 2022-11-26: qty 1050

## 2022-11-28 ENCOUNTER — Encounter: Payer: Self-pay | Admitting: Obstetrics and Gynecology

## 2022-11-29 ENCOUNTER — Encounter: Payer: Medicaid Other | Admitting: Obstetrics and Gynecology

## 2022-12-03 ENCOUNTER — Encounter (HOSPITAL_COMMUNITY)
Admission: RE | Admit: 2022-12-03 | Discharge: 2022-12-03 | Disposition: A | Discharge status: Home / Self Care | Payer: Medicaid Other | Source: Ambulatory Visit | Attending: Maternal & Fetal Medicine | Admitting: Maternal & Fetal Medicine

## 2022-12-03 DIAGNOSIS — O0993 Supervision of high risk pregnancy, unspecified, third trimester: Secondary | ICD-10-CM | POA: Diagnosis not present

## 2022-12-03 MED ORDER — IMMUNE GLOBULIN (HUMAN) 10 GM/100ML IV SOLN
1.0000 g/kg | INTRAVENOUS | Status: DC
  Administered 2022-12-03: 105 g via INTRAVENOUS
  Filled 2022-12-03: qty 1050

## 2022-12-05 ENCOUNTER — Encounter: Payer: Medicaid Other | Admitting: Obstetrics and Gynecology

## 2022-12-09 ENCOUNTER — Other Ambulatory Visit (HOSPITAL_COMMUNITY): Payer: Self-pay | Admitting: *Deleted

## 2022-12-10 ENCOUNTER — Inpatient Hospital Stay (HOSPITAL_COMMUNITY): Admission: RE | Admit: 2022-12-10 | Payer: Medicaid Other | Source: Ambulatory Visit

## 2022-12-12 ENCOUNTER — Encounter (HOSPITAL_COMMUNITY)
Admission: RE | Admit: 2022-12-12 | Discharge: 2022-12-12 | Disposition: A | Discharge status: Home / Self Care | Payer: Medicaid Other | Source: Ambulatory Visit | Attending: Maternal & Fetal Medicine | Admitting: Maternal & Fetal Medicine

## 2022-12-12 DIAGNOSIS — O0993 Supervision of high risk pregnancy, unspecified, third trimester: Secondary | ICD-10-CM | POA: Diagnosis not present

## 2022-12-12 MED ORDER — IMMUNE GLOBULIN (HUMAN) 10 GM/100ML IV SOLN
1.0000 g/kg | INTRAVENOUS | Status: DC
  Administered 2022-12-12: 105 g via INTRAVENOUS
  Filled 2022-12-12: qty 1050

## 2022-12-16 ENCOUNTER — Encounter: Payer: Self-pay | Admitting: Obstetrics & Gynecology

## 2022-12-16 ENCOUNTER — Ambulatory Visit (INDEPENDENT_AMBULATORY_CARE_PROVIDER_SITE_OTHER): Payer: Medicaid Other | Admitting: Obstetrics & Gynecology

## 2022-12-16 VITALS — BP 126/84 | HR 84 | Wt 227.0 lb

## 2022-12-16 DIAGNOSIS — O99112 Other diseases of the blood and blood-forming organs and certain disorders involving the immune mechanism complicating pregnancy, second trimester: Secondary | ICD-10-CM

## 2022-12-16 DIAGNOSIS — D6959 Other secondary thrombocytopenia: Secondary | ICD-10-CM

## 2022-12-16 DIAGNOSIS — O09292 Supervision of pregnancy with other poor reproductive or obstetric history, second trimester: Secondary | ICD-10-CM

## 2022-12-16 DIAGNOSIS — Z3A27 27 weeks gestation of pregnancy: Secondary | ICD-10-CM

## 2022-12-16 DIAGNOSIS — O09299 Supervision of pregnancy with other poor reproductive or obstetric history, unspecified trimester: Secondary | ICD-10-CM

## 2022-12-16 DIAGNOSIS — O0993 Supervision of high risk pregnancy, unspecified, third trimester: Secondary | ICD-10-CM

## 2022-12-16 DIAGNOSIS — O0992 Supervision of high risk pregnancy, unspecified, second trimester: Secondary | ICD-10-CM

## 2022-12-16 LAB — POCT URINALYSIS DIPSTICK OB
Bilirubin, UA: NEGATIVE
Blood, UA: NEGATIVE
Glucose, UA: NEGATIVE
Ketones, UA: NEGATIVE
Leukocytes, UA: NEGATIVE
Nitrite, UA: NEGATIVE
POC,PROTEIN,UA: NEGATIVE
Spec Grav, UA: 1.025 (ref 1.010–1.025)
Urobilinogen, UA: 0.2 E.U./dL
pH, UA: 6 (ref 5.0–8.0)

## 2022-12-16 NOTE — Progress Notes (Signed)
HIGH-RISK PREGNANCY VISIT Patient name: Veronica Hendrix MRN [id number]  Date of birth: December 01, 1985 Chief Complaint:   Routine Prenatal Visit  History of Present Illness:   Veronica Hendrix is a 37 y.o. [phone] female at 87w1dwith an Estimated Date of Delivery: 03/16/23 being seen today for ongoing management of a high-risk pregnancy complicated by  FNAIT-high risk for fetal cranial hemorrhage  [facility] [place] so if possible she does sometime prefer to come here   Today she reports  notes anxiety- lots of setbacks at home.  Car broke down, grieving about the loss of her mom and child .   Contractions: Irritability. Vag. Bleeding: None.  Movement: Present. denies leaking of fluid.      09/05/2022   10:52 AM 02/19/2021    8:57 AM 11/02/2020    2:04 PM 09/13/2020    9:33 AM 05/21/2018    4:12 PM  Depression screen PHQ 2/9  Decreased Interest '2 2 1 1 '$ 0  Down, Depressed, Hopeless '2 1 1 '$ 0 0  PHQ - 2 Score '4 3 2 1 '$ 0  Altered sleeping '2 2 1 1   '$ Tired, decreased energy '2 1 2 1   '$ Change in appetite 2 0 1 1   Feeling bad or failure about yourself  2 0 2 1   Trouble concentrating '1 2 1 '$ 0   Moving slowly or fidgety/restless 0 0 0 0   Suicidal thoughts 0 0 0 0   PHQ-9 Score '13 8 9 5      '$ Current Outpatient Medications  Medication Instructions   aspirin EC 81 mg, Oral, Daily, Swallow whole.   brexpiprazole (REXULTI) 1 mg, Oral, Daily   predniSONE (DELTASONE) 50 mg, Oral, Daily with breakfast   predniSONE (DELTASONE) 2.5 mg, Oral, Daily with breakfast   predniSONE (DELTASONE) 10 mg, Oral, Daily with breakfast   Prenatal Vit-Fe Fumarate-FA (PRENATAL VITAMIN PO) 1 tablet, Oral, Daily   promethazine (PHENERGAN) 25 mg, Oral, Every 6 hours PRN   sertraline (ZOLOFT) 50 mg, Oral, Daily, Takes 2 for 100 mg daily     Review of Systems:   Pertinent items are noted in HPI Denies abnormal vaginal discharge w/  itching/odor/irritation, headaches, visual changes, shortness of breath, chest pain, abdominal pain, severe nausea/vomiting, or problems with urination or bowel movements unless otherwise stated above. Pertinent History Reviewed:  Reviewed past medical,surgical, social, obstetrical and family history.  Reviewed problem list, medications and allergies. Physical Assessment:   Vitals:   12/16/22 1005  BP: 126/84  Pulse: 84  Weight: 227 lb (103 kg)  Body mass index is 37.77 kg/m.           Physical Examination:   General appearance: alert, well appearing, tearful Mental status: depressed mood  Skin: warm & dry   Extremities: Edema: None    Cardiovascular: normal heart rate noted  Respiratory: normal respiratory effort, no distress  Abdomen: gravid, soft, non-tender  Pelvic: Cervical exam deferred         Fetal Status: Fetal Heart Rate (bpm): 130 Fundal Height: 28 cm Movement: Present    Fetal Surveillance Testing today: doppler   Chaperone: N/A    Results for orders placed or performed in visit on 12/16/22 (from the past 24 hour(s))  POC Urinalysis Dipstick OB   Collection Time: 12/16/22 10:10 AM  Result Value Ref Range   Color, UA     Clarity, UA  Glucose, UA Negative Negative   Bilirubin, UA Negative    Ketones, UA Negative    Spec Grav, UA 1.025 1.010 - 1.025   Blood, UA Negative    pH, UA 6.0 5.0 - 8.0   POC,PROTEIN,UA Negative Negative, Trace, Small (1+), Moderate (2+), Large (3+), 4+   Urobilinogen, UA 0.2 0.2 or 1.0 E.U./dL   Nitrite, UA Negative    Leukocytes, UA Negative Negative   Appearance     Odor       Assessment & Plan:  High-risk pregnancy: [id number] at 10w1dwith an Estimated Date of Delivery: 03/16/23   1) [facility] and Prednisone daily -antepartum testing @ 32wks  2) Anxiety - encouraged therapist when possible and options for remote visit -continue with current meds  -management/delivery reviewed by Dr.  GLeward Quanat her last visit 2/12 -[facility] Meds: No orders of the defined types were placed in this encounter.   Labs/procedures today: none  Treatment Plan:   as outlined above  Reviewed: Preterm labor symptoms and general obstetric precautions including but not limited to vaginal bleeding, contractions, leaking of fluid and fetal movement were reviewed in detail with the patient.  All questions were answered. Pt has home bp cuff. Check bp weekly, let u[place]now if >140/90.   Follow-up: Return in about 3 weeks (around 01/06/2023) for HRidottvisit, '[]'$  set up weekly NST in 5wks.   Future Appointments  Date Time Provider [place] 12/17/2022  9:00 AM [facility]-RM5 [facility] None  12/24/2022  8:00 AM [facility]-RM6 [facility] None  01/07/2023  9:10 AM [place], LMertie Clause MD [facility] FTOBGYN    Orders Placed This Encounter  Procedures   POC Urinalysis Dipstick OB    JJanyth Pupa DO Attending OBonifay FNew [facility] Divisionfor WDe[employer] CBarronettGroup

## 2022-12-17 ENCOUNTER — Encounter (HOSPITAL_COMMUNITY)
Admission: RE | Admit: 2022-12-17 | Discharge: 2022-12-17 | Disposition: A | Discharge status: Home / Self Care | Payer: Medicaid Other | Source: Ambulatory Visit | Attending: Maternal & Fetal Medicine | Admitting: Maternal & Fetal Medicine

## 2022-12-17 DIAGNOSIS — O0993 Supervision of high risk pregnancy, unspecified, third trimester: Secondary | ICD-10-CM | POA: Diagnosis not present

## 2022-12-17 MED ORDER — IMMUNE GLOBULIN (HUMAN) 10 GM/100ML IV SOLN
1.0000 g/kg | INTRAVENOUS | Status: DC
  Administered 2022-12-17: 105 g via INTRAVENOUS
  Filled 2022-12-17: qty 1050

## 2022-12-24 ENCOUNTER — Encounter (HOSPITAL_COMMUNITY)
Admission: RE | Admit: 2022-12-24 | Discharge: 2022-12-24 | Disposition: A | Discharge status: Home / Self Care | Payer: Medicaid Other | Source: Ambulatory Visit | Attending: Maternal & Fetal Medicine | Admitting: Maternal & Fetal Medicine

## 2022-12-24 DIAGNOSIS — O0993 Supervision of high risk pregnancy, unspecified, third trimester: Secondary | ICD-10-CM | POA: Diagnosis not present

## 2022-12-24 DIAGNOSIS — Z3A27 27 weeks gestation of pregnancy: Secondary | ICD-10-CM | POA: Insufficient documentation

## 2022-12-24 DIAGNOSIS — D6959 Other secondary thrombocytopenia: Secondary | ICD-10-CM | POA: Diagnosis not present

## 2022-12-24 DIAGNOSIS — O99113 Other diseases of the blood and blood-forming organs and certain disorders involving the immune mechanism complicating pregnancy, third trimester: Secondary | ICD-10-CM | POA: Diagnosis not present

## 2022-12-24 MED ORDER — IMMUNE GLOBULIN (HUMAN) 10 GM/100ML IV SOLN
1.0000 g/kg | INTRAVENOUS | Status: DC
  Administered 2022-12-24: 105 g via INTRAVENOUS
  Filled 2022-12-24: qty 1050

## 2022-12-30 NOTE — Addendum Note (Signed)
Encounter addended by: Leonarda Salon, RN on: 12/30/2022 8:54 AM  Actions taken: MAR administration edited, MAR administration accepted

## 2022-12-31 ENCOUNTER — Encounter (HOSPITAL_COMMUNITY)
Admission: RE | Admit: 2022-12-31 | Discharge: 2022-12-31 | Disposition: A | Discharge status: Home / Self Care | Payer: Medicaid Other | Source: Ambulatory Visit | Attending: Maternal & Fetal Medicine | Admitting: Maternal & Fetal Medicine

## 2022-12-31 DIAGNOSIS — O0993 Supervision of high risk pregnancy, unspecified, third trimester: Secondary | ICD-10-CM | POA: Diagnosis not present

## 2022-12-31 MED ORDER — IMMUNE GLOBULIN (HUMAN) 10 GM/100ML IV SOLN
1.0000 g/kg | INTRAVENOUS | Status: DC
  Administered 2022-12-31: 105 g via INTRAVENOUS
  Filled 2022-12-31: qty 1050

## 2023-01-02 MED FILL — Immune Globulin (Human) IV Soln 20 GM/200ML: INTRAVENOUS | Qty: 200 | Status: AC

## 2023-01-02 MED FILL — Immune Globulin (Human) IV Soln 5 GM/50ML: INTRAVENOUS | Qty: 50 | Status: AC

## 2023-01-02 MED FILL — Immune Globulin (Human) IV Soln 40 GM/400ML: INTRAVENOUS | Qty: 800 | Status: AC

## 2023-01-07 ENCOUNTER — Ambulatory Visit (INDEPENDENT_AMBULATORY_CARE_PROVIDER_SITE_OTHER): Payer: Medicaid Other | Admitting: Obstetrics & Gynecology

## 2023-01-07 ENCOUNTER — Encounter: Payer: Self-pay | Admitting: Obstetrics & Gynecology

## 2023-01-07 VITALS — BP 125/84 | HR 91 | Wt 238.0 lb

## 2023-01-07 DIAGNOSIS — D6959 Other secondary thrombocytopenia: Secondary | ICD-10-CM

## 2023-01-07 DIAGNOSIS — O0993 Supervision of high risk pregnancy, unspecified, third trimester: Secondary | ICD-10-CM

## 2023-01-07 DIAGNOSIS — O99113 Other diseases of the blood and blood-forming organs and certain disorders involving the immune mechanism complicating pregnancy, third trimester: Secondary | ICD-10-CM

## 2023-01-07 DIAGNOSIS — O99119 Other diseases of the blood and blood-forming organs and certain disorders involving the immune mechanism complicating pregnancy, unspecified trimester: Secondary | ICD-10-CM

## 2023-01-07 DIAGNOSIS — Z3A30 30 weeks gestation of pregnancy: Secondary | ICD-10-CM

## 2023-01-07 NOTE — Progress Notes (Signed)
HIGH-RISK PREGNANCY VISIT Patient name: Veronica Hendrix MRN [id number]  Date of birth: [birth year] Chief Complaint:   Routine Prenatal Visit  History of Present Illness:   Veronica Hendrix is a 37 y.o. [phone] female at [redacted]w[redacted]d with an Estimated Date of Delivery: 03/16/23 being seen today for ongoing management of a high-risk pregnancy complicated by Featal and Neonatal alloimmune idiopathic thrombocytopenia.    Today she reports no complaints. Contractions: Not present. Vag. Bleeding: None.  Movement: Present. denies leaking of fluid.      09/05/2022   10:52 AM 02/19/2021    8:57 AM 11/02/2020    2:04 PM 09/13/2020    9:33 AM 05/21/2018    4:12 PM  Depression screen PHQ 2/9  Decreased Interest 2 2 1 1  0  Down, Depressed, Hopeless 2 1 1  0 0  PHQ - 2 Score 4 3 2 1  0  Altered sleeping 2 2 1 1    Tired, decreased energy 2 1 2 1    Change in appetite 2 0 1 1   Feeling bad or failure about yourself  2 0 2 1   Trouble concentrating 1 2 1  0   Moving slowly or fidgety/restless 0 0 0 0   Suicidal thoughts 0 0 0 0   PHQ-9 Score 13 8 9 5          09/05/2022   10:52 AM 02/19/2021    8:58 AM 11/02/2020    2:04 PM 09/13/2020    9:33 AM  GAD 7 : Generalized Anxiety Score  Nervous, Anxious, on Edge 3 2 3 1   Control/stop worrying 2 2 3 1   Worry too much - different things 2 2 3 1   Trouble relaxing 2 1 2 1   Restless 2 1 2 1   Easily annoyed or irritable 2 2 3 1   Afraid - awful might happen 1 1 1 1   Total GAD 7 Score 14 11 17 7      Review of Systems:   Pertinent items are noted in HPI Denies abnormal vaginal discharge w/ itching/odor/irritation, headaches, visual changes, shortness of breath, chest pain, abdominal pain, severe nausea/vomiting, or problems with urination or bowel movements unless otherwise stated above. Pertinent History Reviewed:  Reviewed past medical,surgical, social, obstetrical and family history.  Reviewed problem list, medications and allergies. Physical Assessment:    Vitals:   01/07/23 0851  BP: 125/84  Pulse: 91  Weight: 238 lb (108 kg)  Body mass index is 39.61 kg/m.           Physical Examination:   General appearance: alert, well appearing, and in no distress  Mental status: alert, oriented to person, place, and time  Skin: warm & dry   Extremities: Edema: None    Cardiovascular: normal heart rate noted  Respiratory: normal respiratory effort, no distress  Abdomen: gravid, soft, non-tender  Pelvic: Cervical exam deferred         Fetal Status:     Movement: Present    Fetal Surveillance Testing today: FHR 1302   Chaperone: N/A    No results found for this or any previous visit (from the past 24 hour(s)).  Assessment & Plan:  High-risk pregnancy: [id number] at [redacted]w[redacted]d with an Estimated Date of Delivery: 03/16/23      ICD-10-CM   1. Supervision of high risk pregnancy in third trimester  O09.93     2. Fetal and Neonatal Alloimmune thrombocytopenia(FNAIT)-->oral prednisone, IV IG, Csection at 36th week  O99.119    D69.59  Meds: No orders of the defined types were placed in this encounter.   Orders: No orders of the defined types were placed in this encounter.    Labs/procedures today: none  Treatment Plan:  as above    Follow-up: Return for HROB, keep scheduled.   Future Appointments  Date Time Provider [place]  01/08/2023  8:00 AM MC[facility]-RM6 [facility] None  01/15/2023  8:00 AM MCINF-RM4 [facility] None  01/20/2023  2:50 PM [facility] NURSE [facility]  01/27/2023  2:50 PM [facility] NURSE [facility]  02/03/2023 10:10 AM [facility] NURSE [facility]  02/10/2023  9:50 AM [facility] NURSE [facility]  02/17/2023  9:50 AM [facility] NURSE [facility]  02/24/2023 10:10 AM [facility] NURSE [facility]  03/03/2023  9:50 AM [facility] NURSE [facility]  03/10/2023  9:50 AM [facility] NURSE [facility]    No orders of the defined types were placed in this encounter.  Florian Buff   Attending Physician for the [facility] 01/07/2023 9:16 AM

## 2023-01-08 ENCOUNTER — Encounter (HOSPITAL_COMMUNITY)
Admission: RE | Admit: 2023-01-08 | Discharge: 2023-01-08 | Disposition: A | Discharge status: Home / Self Care | Payer: Medicaid Other | Source: Ambulatory Visit | Attending: Maternal & Fetal Medicine | Admitting: Maternal & Fetal Medicine

## 2023-01-08 DIAGNOSIS — O0993 Supervision of high risk pregnancy, unspecified, third trimester: Secondary | ICD-10-CM | POA: Diagnosis not present

## 2023-01-08 MED ORDER — IMMUNE GLOBULIN (HUMAN) 10 GM/100ML IV SOLN
1.0000 g/kg | INTRAVENOUS | Status: DC
  Administered 2023-01-08: 105 g via INTRAVENOUS
  Filled 2023-01-08: qty 1050

## 2023-01-15 ENCOUNTER — Encounter (HOSPITAL_COMMUNITY)
Admission: RE | Admit: 2023-01-15 | Discharge: 2023-01-15 | Disposition: A | Discharge status: Home / Self Care | Payer: Medicaid Other | Source: Ambulatory Visit | Attending: Maternal & Fetal Medicine | Admitting: Maternal & Fetal Medicine

## 2023-01-15 DIAGNOSIS — O0993 Supervision of high risk pregnancy, unspecified, third trimester: Secondary | ICD-10-CM | POA: Diagnosis not present

## 2023-01-15 MED ORDER — IMMUNE GLOBULIN (HUMAN) 10 GM/100ML IV SOLN
1.0000 g/kg | INTRAVENOUS | Status: DC
  Administered 2023-01-15: 110 g via INTRAVENOUS
  Filled 2023-01-15: qty 1100

## 2023-01-16 ENCOUNTER — Other Ambulatory Visit: Payer: Self-pay | Admitting: Obstetrics & Gynecology

## 2023-01-16 DIAGNOSIS — O133 Gestational [pregnancy-induced] hypertension without significant proteinuria, third trimester: Secondary | ICD-10-CM

## 2023-01-16 NOTE — Progress Notes (Signed)
Care plan reviewed with [facility]- now gestational HTN- problem list updated

## 2023-01-20 ENCOUNTER — Other Ambulatory Visit: Payer: Self-pay | Admitting: Obstetrics & Gynecology

## 2023-01-20 ENCOUNTER — Other Ambulatory Visit: Payer: Medicaid Other

## 2023-01-20 DIAGNOSIS — O133 Gestational [pregnancy-induced] hypertension without significant proteinuria, third trimester: Secondary | ICD-10-CM

## 2023-01-21 ENCOUNTER — Ambulatory Visit (INDEPENDENT_AMBULATORY_CARE_PROVIDER_SITE_OTHER): Payer: Medicaid Other

## 2023-01-21 ENCOUNTER — Encounter: Payer: Self-pay | Admitting: Women's Health

## 2023-01-21 ENCOUNTER — Ambulatory Visit (INDEPENDENT_AMBULATORY_CARE_PROVIDER_SITE_OTHER): Payer: Medicaid Other | Admitting: Women's Health

## 2023-01-21 VITALS — BP 127/89 | HR 94 | Wt 241.0 lb

## 2023-01-21 DIAGNOSIS — O133 Gestational [pregnancy-induced] hypertension without significant proteinuria, third trimester: Secondary | ICD-10-CM | POA: Diagnosis not present

## 2023-01-21 DIAGNOSIS — Z3A32 32 weeks gestation of pregnancy: Secondary | ICD-10-CM | POA: Diagnosis not present

## 2023-01-21 DIAGNOSIS — O99113 Other diseases of the blood and blood-forming organs and certain disorders involving the immune mechanism complicating pregnancy, third trimester: Secondary | ICD-10-CM

## 2023-01-21 DIAGNOSIS — Z1389 Encounter for screening for other disorder: Secondary | ICD-10-CM

## 2023-01-21 DIAGNOSIS — O0993 Supervision of high risk pregnancy, unspecified, third trimester: Secondary | ICD-10-CM

## 2023-01-21 DIAGNOSIS — R110 Nausea: Secondary | ICD-10-CM

## 2023-01-21 DIAGNOSIS — Z8632 Personal history of gestational diabetes: Secondary | ICD-10-CM

## 2023-01-21 DIAGNOSIS — O09299 Supervision of pregnancy with other poor reproductive or obstetric history, unspecified trimester: Secondary | ICD-10-CM

## 2023-01-21 DIAGNOSIS — D6959 Other secondary thrombocytopenia: Secondary | ICD-10-CM

## 2023-01-21 DIAGNOSIS — O09293 Supervision of pregnancy with other poor reproductive or obstetric history, third trimester: Secondary | ICD-10-CM

## 2023-01-21 DIAGNOSIS — Z331 Pregnant state, incidental: Secondary | ICD-10-CM

## 2023-01-21 LAB — POCT URINALYSIS DIPSTICK OB
Blood, UA: NEGATIVE
Glucose, UA: NEGATIVE
Ketones, UA: NEGATIVE
Leukocytes, UA: NEGATIVE
Nitrite, UA: NEGATIVE
POC,PROTEIN,UA: NEGATIVE

## 2023-01-21 MED ORDER — PROMETHAZINE HCL 25 MG PO TABS: 25.0000 mg | ORAL_TABLET | Freq: Four times a day (QID) | ORAL | 1 refills | Status: AC | PRN

## 2023-01-21 NOTE — Progress Notes (Addendum)
HIGH-RISK PREGNANCY VISIT Patient name: Veronica Hendrix MRN [id number]  Date of birth: 03/05/1986 Chief Complaint:   Routine Prenatal Visit and Pregnancy Ultrasound (Need refill phenergan)  History of Present Illness:   Veronica Hendrix is a 37 y.o. [id number] female at [redacted]w[redacted]d with an Estimated Date of Delivery: 03/16/23 being seen today for ongoing management of a high-risk pregnancy complicated by GHTN, FNAIT.    Today she  requests refill on phenergan . Denies ha, visual changes, ruq/epigastric pain, n/v.   Contractions: Not present.  .  Movement: Present. denies leaking of fluid.      09/05/2022   10:52 AM 02/19/2021    8:57 AM 11/02/2020    2:04 PM 09/13/2020    9:33 AM 05/21/2018    4:12 PM  Depression screen PHQ 2/9  Decreased Interest 2 2 1 1  0  Down, Depressed, Hopeless 2 1 1  0 0  PHQ - 2 Score 4 3 2 1  0  Altered sleeping 2 2 1 1    Tired, decreased energy 2 1 2 1    Change in appetite 2 0 1 1   Feeling bad or failure about yourself  2 0 2 1   Trouble concentrating 1 2 1  0   Moving slowly or fidgety/restless 0 0 0 0   Suicidal thoughts 0 0 0 0   PHQ-9 Score 13 8 9 5          09/05/2022   10:52 AM 02/19/2021    8:58 AM 11/02/2020    2:04 PM 09/13/2020    9:33 AM  GAD 7 : Generalized Anxiety Score  Nervous, Anxious, on Edge 3 2 3 1   Control/stop worrying 2 2 3 1   Worry too much - different things 2 2 3 1   Trouble relaxing 2 1 2 1   Restless 2 1 2 1   Easily annoyed or irritable 2 2 3 1   Afraid - awful might happen 1 1 1 1   Total GAD 7 Score 14 11 17 7      Review of Systems:   Pertinent items are noted in HPI Denies abnormal vaginal discharge w/ itching/odor/irritation, headaches, visual changes, shortness of breath, chest pain, abdominal pain, severe nausea/vomiting, or problems with urination or bowel movements unless otherwise stated above. Pertinent History Reviewed:  Reviewed past medical,surgical, social, obstetrical and family history.  Reviewed problem list,  medications and allergies. Physical Assessment:   Vitals:   01/21/23 1152  BP: 127/89  Pulse: 94  Weight: 241 lb (109.3 kg)  Body mass index is 40.1 kg/m.           Physical Examination:   General appearance: alert, well appearing, and in no distress  Mental status: alert, oriented to person, place, and time  Skin: warm & dry   Extremities: Edema: Trace    Cardiovascular: normal heart rate noted  Respiratory: normal respiratory effort, no distress  Abdomen: gravid, soft, non-tender  Pelvic: Cervical exam deferred         Fetal Surveillance Testing today: [place] 32 wks,transverse head left,anterior placenta gr 1,FHR 123 bpm,AFI 17 cm,BPP 8/8,EFW 2013 g 50%,RI .59,.70,67=69%   Chaperone: N/A    Results for orders placed or performed in visit on 01/21/23 (from the past 24 hour(s))  POC Urinalysis Dipstick OB   Collection Time: 01/21/23 12:37 PM  Result Value Ref Range   Color, UA     Clarity, UA     Glucose, UA Negative Negative   Bilirubin, UA     Ketones, UA  negative    Spec Grav, UA     Blood, UA negative    pH, UA     POC,PROTEIN,UA Negative Negative, Trace, Small (1+), Moderate (2+), Large (3+), 4+   Urobilinogen, UA     Nitrite, UA negative    Leukocytes, UA Negative Negative   Appearance     Odor      Assessment & Plan:  High-risk pregnancy: [id number] at [redacted]w[redacted]d with an Estimated Date of Delivery: 03/16/23   1) GHTN, stable, no meds, asymptomatic. Reviewed pre-e s/s, reasons to seek care. Home bp cuff not accurate per pt  2) FNAIT, managed by [facility], next appt 4/18 per pt-plan is PCS w/ BTL there   Meds:  Meds ordered this encounter  Medications   promethazine (PHENERGAN) 25 MG tablet    Sig: Take 1 tablet (25 mg total) by mouth every 6 (six) hours as needed for nausea or vomiting.    Dispense:  30 tablet    Refill:  1    Labs/procedures today: U/S  Treatment Plan:  2x/wk testing, deliver @ [facility]  Reviewed: Preterm labor symptoms and general obstetric  precautions including but not limited to vaginal bleeding, contractions, leaking of fluid and fetal movement were reviewed in detail with the patient.  All questions were answered. Follow-up: Return for As scheduled.   Future Appointments  Date Time Provider [place]  01/22/2023  8:00 AM MCINF-RM5 MC-MCINF None  01/24/2023  9:50 AM [facility]-FTOBGYN NURSE [facility]TO[facility]  01/28/2023 10:45 AM C[facility] - FTOBGYN [place] CWH-FTIMG None  01/28/2023 11:50 AM Florian Buff, MD [facility]-FT [facility]TO[facility]YN  01/31/2023 10:50 AM [facility]-FT[facility]GYN NURSE [facility]  02/03/2023 10:10 AM [facility]GYN NURSE [facility]  02/11/2023 10:10 AM [facility]-FTOBGYN NURSE [facility]  02/14/2023 10:45 AM C[facility] - FTOBGYN [place] C[facility]-FTIMG None  02/14/2023 11:30 AM Myrtis Ser, CNM [facility]TO[facility]  02/17/2023  9:50 AM [facility]-FTOBGYN NURSE [facility]  02/19/2023  8:00 AM MCINF-RM5 MC-MCINF None  02/20/2023 10:00 AM C[facility] - FTOBGYN [place] C[facility]-FTIMG None  02/20/2023 11:10 AM Janyth Pupa, DO [facility]-FT FTOBGYN    Orders Placed This Encounter  Procedures   POC Urinalysis Dipstick OB   Roma Schanz CNM, [facility] 01/21/2023 12:46 PM

## 2023-01-21 NOTE — Addendum Note (Signed)
Addended by: Diona Fanti A on: 01/21/2023 12:39 PM   Modules accepted: Orders

## 2023-01-21 NOTE — Progress Notes (Signed)
[place] 32 wks,transverse head left,anterior placenta gr 1,FHR 123 bpm,AFI 17 cm,BPP 8/8,EFW 2013 g 50%,RI .59,.70,67=69%

## 2023-01-21 NOTE — Patient Instructions (Signed)
Veronica Hendrix, thank you for choosing our office today! We appreciate the opportunity to meet your healthcare needs. You may receive a short survey by mail, e-mail, or through MyChart. If you are happy with your care we would appreciate if you could take just a few minutes to complete the survey questions. We read all of your comments and take your feedback very seriously. Thank you again for choosing our office.  [facility] [place] ([address] [place], [place] [address]) Entrance C, located off of [address] Free 24/7 valet parking   CLASSES: Go to [email or web address] to register for classes (childbirth, breastfeeding, waterbirth, infant CPR, daddy bootcamp, etc.)  Call the office ([phone]) or go to [facility] if: You begin to have strong, frequent contractions Your water breaks.  Sometimes it is a big gush of fluid, sometimes it is just a trickle that keeps getting your panties wet or running down your legs You have vaginal bleeding.  It is normal to have a small amount of spotting if your cervix was checked.  You don't feel your baby moving like normal.  If you don't, get you something to eat and drink and lay down and focus on feeling your baby move.   If your baby is still not moving like normal, you should call the office or go to [facility].  Call the office ([phone]) or go to [facility] for these signs of pre-eclampsia: Severe headache that does not go away with Tylenol Visual changes- seeing spots, double, blurred vision Pain under your right breast or upper abdomen that does not go away with Tums or heartburn medicine Nausea and/or vomiting Severe swelling in your hands, feet, and face   Tdap Vaccine It is recommended that you get the Tdap vaccine during the third trimester of EACH pregnancy to help protect your baby from getting pertussis (whooping cough) 27-36 weeks is the BEST time to do this  so that you can pass the protection on to your baby. During pregnancy is better than after pregnancy, but if you are unable to get it during pregnancy it will be offered at the hospital.  You can get this vaccine with us, at the health department, your family doctor, or some local pharmacies Everyone who will be around your baby should also be up-to-date on their vaccines before the baby comes. Adults (who are not pregnant) only need 1 dose of Tdap during adulthood.   [place] Pediatricians/Family Doctors [facility] ([facility]): [address], [phone]           [facility]: [address], [phone]                [facility] ([facility]): [address], [phone] (call to ask if accepting patients) [facility]: [address], [place], [phone]    [place] Pediatricians/Family Doctors [facility] ([facility]): [address], [phone] [facility]: [address], [phone] [facility]: [address], [phone]  [place] Family [facility]  [facility] ([facility]): [phone] [facility]: [address], [phone]   [place] Family Doctors [facility]: [address], [phone]  [place] [facility]  [facility]: [address], [phone]  Home Blood Pressure Monitoring for Patients   Your provider has recommended that you check your   blood pressure (BP) at least once a week at home. If you do not have a blood pressure cuff at home, one will be provided for you. Contact your provider if you have not received your monitor within 1 week.   Helpful Tips for Accurate Home Blood Pressure Checks  Don't smoke, exercise, or drink caffeine 30 minutes before checking your BP Use the restroom before checking your BP (a full bladder can raise your  pressure) Relax in a comfortable upright chair Feet on the ground Left arm resting comfortably on a flat surface at the level of your heart Legs uncrossed Back supported Sit quietly and don't talk Place the cuff on your bare arm Adjust snuggly, so that only two fingertips can fit between your skin and the top of the cuff Check 2 readings separated by at least one minute Keep a log of your BP readings For a visual, please reference this diagram: http://ccnc.care/bpdiagram  Provider Name: [facility]     Phone: [phone]  Zone 1: ALL CLEAR  Continue to monitor your symptoms:  BP reading is less than 140 (top number) or less than 90 (bottom number)  No right upper stomach pain No headaches or seeing spots No feeling nauseated or throwing up No swelling in face and hands  Zone 2: CAUTION Call your doctor's office for any of the following:  BP reading is greater than 140 (top number) or greater than 90 (bottom number)  Stomach pain under your ribs in the middle or right side Headaches or seeing spots Feeling nauseated or throwing up Swelling in face and hands  Zone 3: EMERGENCY  Seek immediate medical care if you have any of the following:  BP reading is greater than160 (top number) or greater than 110 (bottom number) Severe headaches not improving with Tylenol Serious difficulty catching your breath Any worsening symptoms from Zone 2  Preterm Labor and Birth Information  The normal length of a pregnancy is 39-41 weeks. Preterm labor is when labor starts before 37 completed weeks of pregnancy. What are the risk factors for preterm labor? Preterm labor is more likely to occur in women who: Have certain infections during pregnancy such as a bladder infection, sexually transmitted infection, or infection inside the uterus (chorioamnionitis). Have a shorter-than-normal cervix. Have gone into preterm labor before. Have had surgery on their cervix. Are younger than age 17  or older than age 35. Are African American. Are pregnant with twins or multiple babies (multiple gestation). Take street drugs or smoke while pregnant. Do not gain enough weight while pregnant. Became pregnant shortly after having been pregnant. What are the symptoms of preterm labor? Symptoms of preterm labor include: Cramps similar to those that can happen during a menstrual period. The cramps may happen with diarrhea. Pain in the abdomen or lower back. Regular uterine contractions that may feel like tightening of the abdomen. A feeling of increased pressure in the pelvis. Increased watery or bloody mucus discharge from the vagina. Water breaking (ruptured amniotic sac). Why is it important to recognize signs of preterm labor? It is important to recognize signs of preterm labor because babies who are born prematurely may not be fully developed. This can put them at an increased risk for: Long-term (chronic) heart and lung problems. Difficulty immediately after birth with regulating body systems, including blood sugar, body temperature, heart rate, and breathing rate. Bleeding in the brain. Cerebral palsy. Learning difficulties. Death. These risks are highest for babies who are born before 34 weeks   of pregnancy. How is preterm labor treated? Treatment depends on the length of your pregnancy, your condition, and the health of your baby. It may involve: Having a stitch (suture) placed in your cervix to prevent your cervix from opening too early (cerclage). Taking or being given medicines, such as: Hormone medicines. These may be given early in pregnancy to help support the pregnancy. Medicine to stop contractions. Medicines to help mature the baby's lungs. These may be prescribed if the risk of delivery is high. Medicines to prevent your baby from developing cerebral palsy. If the labor happens before 34 weeks of pregnancy, you may need to stay in the hospital. What should I do if I  think I am in preterm labor? If you think that you are going into preterm labor, call your health care provider right away. How can I prevent preterm labor in future pregnancies? To increase your chance of having a full-term pregnancy: Do not use any tobacco products, such as cigarettes, chewing tobacco, and e-cigarettes. If you need help quitting, ask your health care provider. Do not use street drugs or medicines that have not been prescribed to you during your pregnancy. Talk with your health care provider before taking any herbal supplements, even if you have been taking them regularly. Make sure you gain a healthy amount of weight during your pregnancy. Watch for infection. If you think that you might have an infection, get it checked right away. Make sure to tell your health care provider if you have gone into preterm labor before. This information is not intended to replace advice given to you by your health care provider. Make sure you discuss any questions you have with your health care provider. Document Revised: 01/29/2019 Document Reviewed: 02/28/2016 Elsevier Patient Education  2020 [employer]vier Inc.   

## 2023-01-22 ENCOUNTER — Encounter (HOSPITAL_COMMUNITY)
Admission: RE | Admit: 2023-01-22 | Discharge: 2023-01-22 | Disposition: A | Discharge status: Home / Self Care | Payer: Medicaid Other | Source: Ambulatory Visit | Attending: Maternal & Fetal Medicine | Admitting: Maternal & Fetal Medicine

## 2023-01-22 DIAGNOSIS — O0993 Supervision of high risk pregnancy, unspecified, third trimester: Secondary | ICD-10-CM | POA: Insufficient documentation

## 2023-01-22 DIAGNOSIS — Z3A27 27 weeks gestation of pregnancy: Secondary | ICD-10-CM | POA: Insufficient documentation

## 2023-01-22 DIAGNOSIS — O99113 Other diseases of the blood and blood-forming organs and certain disorders involving the immune mechanism complicating pregnancy, third trimester: Secondary | ICD-10-CM | POA: Diagnosis not present

## 2023-01-22 DIAGNOSIS — D6959 Other secondary thrombocytopenia: Secondary | ICD-10-CM | POA: Diagnosis not present

## 2023-01-22 MED ORDER — IMMUNE GLOBULIN (HUMAN) 10 GM/100ML IV SOLN
1.0000 g/kg | INTRAVENOUS | Status: DC
  Administered 2023-01-22: 110 g via INTRAVENOUS
  Filled 2023-01-22: qty 1100

## 2023-01-24 ENCOUNTER — Ambulatory Visit (INDEPENDENT_AMBULATORY_CARE_PROVIDER_SITE_OTHER): Payer: Medicaid Other | Admitting: *Deleted

## 2023-01-24 VITALS — BP 128/89 | HR 89

## 2023-01-24 DIAGNOSIS — Z3A32 32 weeks gestation of pregnancy: Secondary | ICD-10-CM | POA: Diagnosis not present

## 2023-01-24 DIAGNOSIS — O288 Other abnormal findings on antenatal screening of mother: Secondary | ICD-10-CM

## 2023-01-24 DIAGNOSIS — Z1389 Encounter for screening for other disorder: Secondary | ICD-10-CM

## 2023-01-24 DIAGNOSIS — Z331 Pregnant state, incidental: Secondary | ICD-10-CM

## 2023-01-24 LAB — POCT URINALYSIS DIPSTICK OB
Blood, UA: NEGATIVE
Glucose, UA: NEGATIVE
Ketones, UA: NEGATIVE
Nitrite, UA: NEGATIVE
POC,PROTEIN,UA: NEGATIVE

## 2023-01-24 NOTE — Progress Notes (Signed)
   NURSE VISIT- NST  SUBJECTIVE:  Veronica Hendrix is a 37 y.o. [phone] female at [redacted]w[redacted]d, here for a NST for pregnancy complicated by [facility].  She reports active fetal movement, contractions: none, vaginal bleeding: none, membranes: intact.   OBJECTIVE:  LMP 06/09/2022 (Approximate)   Appears well, no apparent distress  No results found for this or any previous visit (from the past 24 hour(s)).  NST: FHR baseline 130 bpm, Variability: moderate, Accelerations:present, Decelerations:  Absent= Cat 1/reactive Toco: none   ASSESSMENT: [id number] at [redacted]w[redacted]d with GHTN NST reactive  PLAN: EFM strip reviewed by Dr. Despina Hidden   Recommendations: keep next appointment as scheduled    Annamarie Dawley  01/24/2023 9:51 AM

## 2023-01-27 ENCOUNTER — Other Ambulatory Visit: Payer: Medicaid Other

## 2023-01-27 ENCOUNTER — Other Ambulatory Visit: Payer: Self-pay | Admitting: Obstetrics & Gynecology

## 2023-01-27 DIAGNOSIS — O133 Gestational [pregnancy-induced] hypertension without significant proteinuria, third trimester: Secondary | ICD-10-CM

## 2023-01-28 ENCOUNTER — Ambulatory Visit (INDEPENDENT_AMBULATORY_CARE_PROVIDER_SITE_OTHER): Payer: Medicaid Other | Admitting: Women's Health

## 2023-01-28 ENCOUNTER — Ambulatory Visit (INDEPENDENT_AMBULATORY_CARE_PROVIDER_SITE_OTHER): Payer: Medicaid Other

## 2023-01-28 ENCOUNTER — Encounter: Payer: Self-pay | Admitting: Women's Health

## 2023-01-28 VITALS — BP 124/88 | HR 96 | Wt 247.0 lb

## 2023-01-28 DIAGNOSIS — O133 Gestational [pregnancy-induced] hypertension without significant proteinuria, third trimester: Secondary | ICD-10-CM

## 2023-01-28 DIAGNOSIS — Z3A33 33 weeks gestation of pregnancy: Secondary | ICD-10-CM

## 2023-01-28 DIAGNOSIS — D6959 Other secondary thrombocytopenia: Secondary | ICD-10-CM

## 2023-01-28 DIAGNOSIS — O99119 Other diseases of the blood and blood-forming organs and certain disorders involving the immune mechanism complicating pregnancy, unspecified trimester: Secondary | ICD-10-CM

## 2023-01-28 DIAGNOSIS — O0993 Supervision of high risk pregnancy, unspecified, third trimester: Secondary | ICD-10-CM

## 2023-01-28 DIAGNOSIS — O99113 Other diseases of the blood and blood-forming organs and certain disorders involving the immune mechanism complicating pregnancy, third trimester: Secondary | ICD-10-CM

## 2023-01-28 MED ORDER — PANTOPRAZOLE SODIUM 20 MG PO TBEC: 20.0000 mg | DELAYED_RELEASE_TABLET | Freq: Every day | ORAL | 1 refills | Status: AC

## 2023-01-28 NOTE — Patient Instructions (Signed)
Petrina, thank you for choosing our office today! We appreciate the opportunity to meet your healthcare needs. You may receive a short survey by mail, e-mail, or through MyChart. If you are happy with your care we would appreciate if you could take just a few minutes to complete the survey questions. We read all of your comments and take your feedback very seriously. Thank you again for choosing our office.  [facility]  [facility] ([address] [place], [place] [address]) Entrance C, located off of [address] Free 24/7 valet parking   CLASSES: Go to [email or web address] to register for classes (childbirth, breastfeeding, waterbirth, infant CPR, daddy bootcamp, etc.)  Call the office ([phone]) or go to [facility] if: You begin to have strong, frequent contractions Your water breaks.  Sometimes it is a big gush of fluid, sometimes it is just a trickle that keeps getting your panties wet or running down your legs You have vaginal bleeding.  It is normal to have a small amount of spotting if your cervix was checked.  You don't feel your baby moving like normal.  If you don't, get you something to eat and drink and lay down and focus on feeling your baby move.   If your baby is still not moving like normal, you should call the office or go to [facility].  Call the office ([phone]) or go to [facility] for these signs of pre-eclampsia: Severe headache that does not go away with Tylenol Visual changes- seeing spots, double, blurred vision Pain under your right breast or upper abdomen that does not go away with Tums or heartburn medicine Nausea and/or vomiting Severe swelling in your hands, feet, and face   Tdap Vaccine It is recommended that you get the Tdap vaccine during the third trimester of EACH pregnancy to help protect your baby from getting pertussis (whooping cough) 27-36 weeks is the BEST time to do this  so that you can pass the protection on to your baby. During pregnancy is better than after pregnancy, but if you are unable to get it during pregnancy it will be offered at the hospital.  You can get this vaccine with us, at the health department, your family doctor, or some local pharmacies Everyone who will be around your baby should also be up-to-date on their vaccines before the baby comes. Adults (who are not pregnant) only need 1 dose of Tdap during adulthood.   [place] Pediatricians/Family Doctors [facility] ([facility]): [address], [phone]           [facility]: [address], [phone]                [facility] ([facility]): [address], [phone] (call to ask if accepting patients) [facility]: [address], [place], [phone]    [place] Pediatricians/Family Doctors [facility] ([facility]): [address], [phone] [facility]: [address], [phone] [facility]: [address], [phone]  [place] [facility]  [facility] ([facility]): [phone] [facility]: [address], [phone]   [place] Family Doctors [facility]: [address], [phone]  [place] [facility]  [facility]: [address], [phone]  Home Blood Pressure Monitoring for Patients   Your provider has recommended that you check your   blood pressure (BP) at least once a week at home. If you do not have a blood pressure cuff at home, one will be provided for you. Contact your provider if you have not received your monitor within 1 week.   Helpful Tips for Accurate Home Blood Pressure Checks  Don't smoke, exercise, or drink caffeine 30 minutes before checking your BP Use the restroom before checking your BP (a full bladder can raise your  pressure) Relax in a comfortable upright chair Feet on the ground Left arm resting comfortably on a flat surface at the level of your heart Legs uncrossed Back supported Sit quietly and don't talk Place the cuff on your bare arm Adjust snuggly, so that only two fingertips can fit between your skin and the top of the cuff Check 2 readings separated by at least one minute Keep a log of your BP readings For a visual, please reference this diagram: http://ccnc.care/bpdiagram  Provider Name: [facility]     Phone: [phone]  Zone 1: ALL CLEAR  Continue to monitor your symptoms:  BP reading is less than 140 (top number) or less than 90 (bottom number)  No right upper stomach pain No headaches or seeing spots No feeling nauseated or throwing up No swelling in face and hands  Zone 2: CAUTION Call your doctor's office for any of the following:  BP reading is greater than 140 (top number) or greater than 90 (bottom number)  Stomach pain under your ribs in the middle or right side Headaches or seeing spots Feeling nauseated or throwing up Swelling in face and hands  Zone 3: EMERGENCY  Seek immediate medical care if you have any of the following:  BP reading is greater than160 (top number) or greater than 110 (bottom number) Severe headaches not improving with Tylenol Serious difficulty catching your breath Any worsening symptoms from Zone 2  Preterm Labor and Birth Information  The normal length of a pregnancy is 39-41 weeks. Preterm labor is when labor starts before 37 completed weeks of pregnancy. What are the risk factors for preterm labor? Preterm labor is more likely to occur in women who: Have certain infections during pregnancy such as a bladder infection, sexually transmitted infection, or infection inside the uterus (chorioamnionitis). Have a shorter-than-normal cervix. Have gone into preterm labor before. Have had surgery on their cervix. Are younger than age 17  or older than age 35. Are African American. Are pregnant with twins or multiple babies (multiple gestation). Take street drugs or smoke while pregnant. Do not gain enough weight while pregnant. Became pregnant shortly after having been pregnant. What are the symptoms of preterm labor? Symptoms of preterm labor include: Cramps similar to those that can happen during a menstrual period. The cramps may happen with diarrhea. Pain in the abdomen or lower back. Regular uterine contractions that may feel like tightening of the abdomen. A feeling of increased pressure in the pelvis. Increased watery or bloody mucus discharge from the vagina. Water breaking (ruptured amniotic sac). Why is it important to recognize signs of preterm labor? It is important to recognize signs of preterm labor because babies who are born prematurely may not be fully developed. This can put them at an increased risk for: Long-term (chronic) heart and lung problems. Difficulty immediately after birth with regulating body systems, including blood sugar, body temperature, heart rate, and breathing rate. Bleeding in the brain. Cerebral palsy. Learning difficulties. Death. These risks are highest for babies who are born before 34 weeks   of pregnancy. How is preterm labor treated? Treatment depends on the length of your pregnancy, your condition, and the health of your baby. It may involve: Having a stitch (suture) placed in your cervix to prevent your cervix from opening too early (cerclage). Taking or being given medicines, such as: Hormone medicines. These may be given early in pregnancy to help support the pregnancy. Medicine to stop contractions. Medicines to help mature the baby's lungs. These may be prescribed if the risk of delivery is high. Medicines to prevent your baby from developing cerebral palsy. If the labor happens before 34 weeks of pregnancy, you may need to stay in the hospital. What should I do if I  think I am in preterm labor? If you think that you are going into preterm labor, call your health care provider right away. How can I prevent preterm labor in future pregnancies? To increase your chance of having a full-term pregnancy: Do not use any tobacco products, such as cigarettes, chewing tobacco, and e-cigarettes. If you need help quitting, ask your health care provider. Do not use street drugs or medicines that have not been prescribed to you during your pregnancy. Talk with your health care provider before taking any herbal supplements, even if you have been taking them regularly. Make sure you gain a healthy amount of weight during your pregnancy. Watch for infection. If you think that you might have an infection, get it checked right away. Make sure to tell your health care provider if you have gone into preterm labor before. This information is not intended to replace advice given to you by your health care provider. Make sure you discuss any questions you have with your health care provider. Document Revised: 01/29/2019 Document Reviewed: 02/28/2016 Elsevier Patient Education  2020 Elsevier Inc.   

## 2023-01-28 NOTE — Progress Notes (Signed)
[place] 33+2 wks,cephalic,FHR 127 bpm,anterior placenta gr 3,AFI 19 cm,RI .63,.63.54=52%,BPP 8/8

## 2023-01-28 NOTE — Progress Notes (Signed)
HIGH-RISK PREGNANCY VISIT Patient name: Veronica Hendrix MRN [id number]  Date of birth: [birth year] Chief Complaint:   Routine Prenatal Visit  History of Present Illness:   Veronica Hendrix is a 37 y.o. [phone] female at [redacted]w[redacted]d with an Estimated Date of Delivery: 03/16/23 being seen today for ongoing management of a high-risk pregnancy complicated by gestational hypertension currently on no meds, FNAIT.    Today she reports heartburn/reflux, hasn't tried TUMS, requests rx. Denies ha, visual changes, ruq/epigastric pain, n/v.   Contractions: Not present. Vag. Bleeding: None.  Movement: Present. denies leaking of fluid.      09/05/2022   10:52 AM 02/19/2021    8:57 AM 11/02/2020    2:04 PM 09/13/2020    9:33 AM 05/21/2018    4:12 PM  Depression screen PHQ 2/9  Decreased Interest 2 2 1 1  0  Down, Depressed, Hopeless 2 1 1  0 0  PHQ - 2 Score 4 3 2 1  0  Altered sleeping 2 2 1 1    Tired, decreased energy 2 1 2 1    Change in appetite 2 0 1 1   Feeling bad or failure about yourself  2 0 2 1   Trouble concentrating 1 2 1  0   Moving slowly or fidgety/restless 0 0 0 0   Suicidal thoughts 0 0 0 0   PHQ-9 Score 13 8 9 5          09/05/2022   10:52 AM 02/19/2021    8:58 AM 11/02/2020    2:04 PM 09/13/2020    9:33 AM  GAD 7 : Generalized Anxiety Score  Nervous, Anxious, on Edge 3 2 3 1   Control/stop worrying 2 2 3 1   Worry too much - different things 2 2 3 1   Trouble relaxing 2 1 2 1   Restless 2 1 2 1   Easily annoyed or irritable 2 2 3 1   Afraid - awful might happen 1 1 1 1   Total GAD 7 Score 14 11 17 7      Review of Systems:   Pertinent items are noted in HPI Denies abnormal vaginal discharge w/ itching/odor/irritation, headaches, visual changes, shortness of breath, chest pain, abdominal pain, severe nausea/vomiting, or problems with urination or bowel movements unless otherwise stated above. Pertinent History Reviewed:  Reviewed past medical,surgical, social, obstetrical and family  history.  Reviewed problem list, medications and allergies. Physical Assessment:   Vitals:   01/28/23 1107  BP: 124/88  Pulse: 96  Weight: 247 lb (112 kg)  Body mass index is 41.1 kg/m.           Physical Examination:   General appearance: alert, well appearing, and in no distress  Mental status: alert, oriented to person, place, and time  Skin: warm & dry   Extremities: Edema: None    Cardiovascular: normal heart rate noted  Respiratory: normal respiratory effort, no distress  Abdomen: gravid, soft, non-tender  Pelvic: Cervical exam deferred         Fetal Status:     Movement: Present    Fetal Surveillance Testing today: [place] 33+2 wks,cephalic,FHR 127 bpm,anterior placenta gr 3,AFI 19 cm,RI .63,.63.54=52%,BPP 8/8   Chaperone: N/A    No results found for this or any previous visit (from the past 24 hour(s)).  Assessment & Plan:  High-risk pregnancy: [id number] at [redacted]w[redacted]d with an Estimated Date of Delivery: 03/16/23   1) GHTN, stable, no meds, asymptomatic. Reviewed pre-e s/s, reasons to seek care. Home bp cuff not accurate per pt  2) FNAIT, managed by [facility], next appt 4/18 for u/s and visit. Per pt-plan is PCS w/ BTL there   3) Reflux> rx protonix  Meds:  Meds ordered this encounter  Medications   pantoprazole (PROTONIX) 20 MG tablet    Sig: Take 1 tablet (20 mg total) by mouth daily.    Dispense:  30 tablet    Refill:  1    Labs/procedures today: U/S  Treatment Plan:  2x/wk testing, deliver @ [facility]  Reviewed: Preterm labor symptoms and general obstetric precautions including but not limited to vaginal bleeding, contractions, leaking of fluid and fetal movement were reviewed in detail with the patient.  All questions were answered.   Follow-up: Return for As scheduled nst/nurse 4/12; needs HROB w/ MD on 4/15 .   Future Appointments  Date Time Provider Department Center  01/31/2023 10:50 AM [facility] NURSE [facility]  02/03/2023 10:10 AM [facility] NURSE [facility]  02/11/2023 10:10 AM [facility] NURSE [facility]  02/14/2023 10:45 AM [facility] [place] [facility] None  02/14/2023 11:30 AM Arabella Merles, CNM [facility]  02/17/2023  9:50 AM [facility] NURSE [facility]  02/19/2023  8:00 AM [facility] None  02/20/2023 10:00 AM [facility] [place] [facility] None  02/20/2023 11:10 AM Myna Hidalgo, DO [facility]    No orders of the defined types were placed in this encounter.  Cheral Marker CNM, [facility] 01/28/2023 12:28 PM

## 2023-01-29 ENCOUNTER — Encounter (HOSPITAL_COMMUNITY)
Admission: RE | Admit: 2023-01-29 | Discharge: 2023-01-29 | Disposition: A | Discharge status: Home / Self Care | Payer: Medicaid Other | Source: Ambulatory Visit | Attending: Maternal & Fetal Medicine | Admitting: Maternal & Fetal Medicine

## 2023-01-29 DIAGNOSIS — O0993 Supervision of high risk pregnancy, unspecified, third trimester: Secondary | ICD-10-CM | POA: Diagnosis not present

## 2023-01-29 MED ORDER — IMMUNE GLOBULIN (HUMAN) 10 GM/100ML IV SOLN
1.0000 g/kg | INTRAVENOUS | Status: DC
  Administered 2023-01-29: 110 g via INTRAVENOUS
  Filled 2023-01-29: qty 1100

## 2023-01-31 ENCOUNTER — Ambulatory Visit (INDEPENDENT_AMBULATORY_CARE_PROVIDER_SITE_OTHER): Payer: Medicaid Other | Admitting: *Deleted

## 2023-01-31 VITALS — BP 127/81 | HR 101 | Wt 244.0 lb

## 2023-01-31 DIAGNOSIS — Z3A33 33 weeks gestation of pregnancy: Secondary | ICD-10-CM

## 2023-01-31 DIAGNOSIS — O133 Gestational [pregnancy-induced] hypertension without significant proteinuria, third trimester: Secondary | ICD-10-CM

## 2023-01-31 DIAGNOSIS — O0993 Supervision of high risk pregnancy, unspecified, third trimester: Secondary | ICD-10-CM

## 2023-01-31 NOTE — Progress Notes (Signed)
   NURSE VISIT- NST  SUBJECTIVE:  Veronica Hendrix is a 37 y.o. [phone] female at [redacted]w[redacted]d, here for a NST for pregnancy complicated by [facility].  She reports active fetal movement, contractions: none, vaginal bleeding: none, membranes: intact.   OBJECTIVE:  BP 127/81   Pulse (!) 101   Wt 244 lb (110.7 kg)   LMP 06/09/2022 (Approximate)   BMI 40.60 kg/m   Appears well, no apparent distress  No results found for this or any previous visit (from the past 24 hour(s)).  NST: FHR baseline 130 bpm, Variability: moderate, Accelerations:present, Decelerations:  Absent= Cat 1/reactive Toco: none   ASSESSMENT: [id number] at [redacted]w[redacted]d with GHTN NST reactive  PLAN: EFM strip reviewed by Dr. Charlotta Newton   Recommendations: keep next appointment as scheduled    Jobe Marker  01/31/2023 12:38 PM

## 2023-02-03 ENCOUNTER — Other Ambulatory Visit: Payer: Medicaid Other

## 2023-02-03 ENCOUNTER — Encounter: Payer: Self-pay | Admitting: Women's Health

## 2023-02-03 ENCOUNTER — Ambulatory Visit (INDEPENDENT_AMBULATORY_CARE_PROVIDER_SITE_OTHER): Payer: Medicaid Other | Admitting: Women's Health

## 2023-02-03 VITALS — BP 127/83 | HR 99 | Wt 248.0 lb

## 2023-02-03 DIAGNOSIS — O0993 Supervision of high risk pregnancy, unspecified, third trimester: Secondary | ICD-10-CM

## 2023-02-03 DIAGNOSIS — O99113 Other diseases of the blood and blood-forming organs and certain disorders involving the immune mechanism complicating pregnancy, third trimester: Secondary | ICD-10-CM

## 2023-02-03 DIAGNOSIS — Z3A34 34 weeks gestation of pregnancy: Secondary | ICD-10-CM

## 2023-02-03 DIAGNOSIS — D6959 Other secondary thrombocytopenia: Secondary | ICD-10-CM

## 2023-02-03 DIAGNOSIS — O133 Gestational [pregnancy-induced] hypertension without significant proteinuria, third trimester: Secondary | ICD-10-CM

## 2023-02-03 NOTE — Patient Instructions (Signed)
Veronica Hendrix, thank you for choosing our office today! We appreciate the opportunity to meet your healthcare needs. You may receive a short survey by mail, e-mail, or through MyChart. If you are happy with your care we would appreciate if you could take just a few minutes to complete the survey questions. We read all of your comments and take your feedback very seriously. Thank you again for choosing our office.  [facility]  [facility] ([address] [place], [place] [address]) Entrance C, located off of [address] Free 24/7 valet parking   CLASSES: Go to [email or web address] to register for classes (childbirth, breastfeeding, waterbirth, infant CPR, daddy bootcamp, etc.)  Call the office ([phone]) or go to [facility] if: You begin to have strong, frequent contractions Your water breaks.  Sometimes it is a big gush of fluid, sometimes it is just a trickle that keeps getting your panties wet or running down your legs You have vaginal bleeding.  It is normal to have a small amount of spotting if your cervix was checked.  You don't feel your baby moving like normal.  If you don't, get you something to eat and drink and lay down and focus on feeling your baby move.   If your baby is still not moving like normal, you should call the office or go to [facility].  Call the office ([phone]) or [facility] for these signs of pre-eclampsia: Severe headache that does not go away with Tylenol Visual changes- seeing spots, double, blurred vision Pain under your right breast or upper abdomen that does not go away with Tums or heartburn medicine Nausea and/or vomiting Severe swelling in your hands, feet, and face   Tdap Vaccine It is recommended that you get the Tdap vaccine during the third trimester of EACH pregnancy to help protect your baby from getting pertussis (whooping cough) 27-36 weeks is the BEST time to do this  so that you can pass the protection on to your baby. During pregnancy is better than after pregnancy, but if you are unable to get it during pregnancy it will be offered at the hospital.  You can get this vaccine with us, at the health department, your family doctor, or some local pharmacies Everyone who will be around your baby should also be up-to-date on their vaccines before the baby comes. Adults (who are not pregnant) only need 1 dose of Tdap during adulthood.   [place] Pediatricians/Family Doctors [facility] ([facility]): [address], [phone]           [facility]: [address], [phone]                [facility] ([facility]): [address], [phone] (call to ask if accepting patients) [facility]: [address], [place], [phone]    [place] Pediatricians/Family Doctors [facility] ([facility]): [address], [phone] [facility]: [address], [phone] [facility]: [address], [phone]  [place] [facility]  [facility] ([facility]): [phone] [facility]: [address], [phone]   [place] Family Doctors [facility]: [address], [phone]  [place] [facility]  [facility]: [address], [phone]  Home Blood Pressure Monitoring for Patients   Your provider has recommended that you check your   blood pressure (BP) at least once a week at home. If you do not have a blood pressure cuff at home, one will be provided for you. Contact your provider if you have not received your monitor within 1 week.   Helpful Tips for Accurate Home Blood Pressure Checks  Don't smoke, exercise, or drink caffeine 30 minutes before checking your BP Use the restroom before checking your BP (a full bladder can raise your  pressure) Relax in a comfortable upright chair Feet on the ground Left arm resting comfortably on a flat surface at the level of your heart Legs uncrossed Back supported Sit quietly and don't talk Place the cuff on your bare arm Adjust snuggly, so that only two fingertips can fit between your skin and the top of the cuff Check 2 readings separated by at least one minute Keep a log of your BP readings For a visual, please reference this diagram: http://ccnc.care/bpdiagram  Provider Name: [facility]     Phone: [phone]  Zone 1: ALL CLEAR  Continue to monitor your symptoms:  BP reading is less than 140 (top number) or less than 90 (bottom number)  No right upper stomach pain No headaches or seeing spots No feeling nauseated or throwing up No swelling in face and hands  Zone 2: CAUTION Call your doctor's office for any of the following:  BP reading is greater than 140 (top number) or greater than 90 (bottom number)  Stomach pain under your ribs in the middle or right side Headaches or seeing spots Feeling nauseated or throwing up Swelling in face and hands  Zone 3: EMERGENCY  Seek immediate medical care if you have any of the following:  BP reading is greater than160 (top number) or greater than 110 (bottom number) Severe headaches not improving with Tylenol Serious difficulty catching your breath Any worsening symptoms from Zone 2  Preterm Labor and Birth Information  The normal length of a pregnancy is 39-41 weeks. Preterm labor is when labor starts before 37 completed weeks of pregnancy. What are the risk factors for preterm labor? Preterm labor is more likely to occur in women who: Have certain infections during pregnancy such as a bladder infection, sexually transmitted infection, or infection inside the uterus (chorioamnionitis). Have a shorter-than-normal cervix. Have gone into preterm labor before. Have had surgery on their cervix. Are younger than age 17  or older than age 35. Are African American. Are pregnant with twins or multiple babies (multiple gestation). Take street drugs or smoke while pregnant. Do not gain enough weight while pregnant. Became pregnant shortly after having been pregnant. What are the symptoms of preterm labor? Symptoms of preterm labor include: Cramps similar to those that can happen during a menstrual period. The cramps may happen with diarrhea. Pain in the abdomen or lower back. Regular uterine contractions that may feel like tightening of the abdomen. A feeling of increased pressure in the pelvis. Increased watery or bloody mucus discharge from the vagina. Water breaking (ruptured amniotic sac). Why is it important to recognize signs of preterm labor? It is important to recognize signs of preterm labor because babies who are born prematurely may not be fully developed. This can put them at an increased risk for: Long-term (chronic) heart and lung problems. Difficulty immediately after birth with regulating body systems, including blood sugar, body temperature, heart rate, and breathing rate. Bleeding in the brain. Cerebral palsy. Learning difficulties. Death. These risks are highest for babies who are born before 34 weeks   of pregnancy. How is preterm labor treated? Treatment depends on the length of your pregnancy, your condition, and the health of your baby. It may involve: Having a stitch (suture) placed in your cervix to prevent your cervix from opening too early (cerclage). Taking or being given medicines, such as: Hormone medicines. These may be given early in pregnancy to help support the pregnancy. Medicine to stop contractions. Medicines to help mature the baby's lungs. These may be prescribed if the risk of delivery is high. Medicines to prevent your baby from developing cerebral palsy. If the labor happens before 34 weeks of pregnancy, you may need to stay in the hospital. What should I do if I  think I am in preterm labor? If you think that you are going into preterm labor, call your health care provider right away. How can I prevent preterm labor in future pregnancies? To increase your chance of having a full-term pregnancy: Do not use any tobacco products, such as cigarettes, chewing tobacco, and e-cigarettes. If you need help quitting, ask your health care provider. Do not use street drugs or medicines that have not been prescribed to you during your pregnancy. Talk with your health care provider before taking any herbal supplements, even if you have been taking them regularly. Make sure you gain a healthy amount of weight during your pregnancy. Watch for infection. If you think that you might have an infection, get it checked right away. Make sure to tell your health care provider if you have gone into preterm labor before. This information is not intended to replace advice given to you by your health care provider. Make sure you discuss any questions you have with your health care provider. Document Revised: 01/29/2019 Document Reviewed: 02/28/2016 Elsevier Patient Education  2020 Elsevier Inc.   

## 2023-02-03 NOTE — Progress Notes (Signed)
HIGH-RISK PREGNANCY VISIT Patient name: Veronica Hendrix MRN [id number]  Date of birth: 01-Jun-1986 Chief Complaint:   Routine Prenatal Visit (NST) and Non-stress Test  History of Present Illness:   Veronica Hendrix is a 37 y.o. [phone] female at [redacted]w[redacted]d with an Estimated Date of Delivery: 03/16/23 being seen today for ongoing management of a high-risk pregnancy complicated by gestational hypertension currently on no meds, FNAIT.    Today she reports  just tired, kids sleeping w/ her so not sleeping well. Has C/S at [facility] on 4/30. Goes there 4/18 for next appt. . Contractions: Not present. Vag. Bleeding: None.  Movement: Present. denies leaking of fluid.      09/05/2022   10:52 AM 02/19/2021    8:57 AM 11/02/2020    2:04 PM 09/13/2020    9:33 AM 05/21/2018    4:12 PM  Depression screen PHQ 2/9  Decreased Interest 0  Down, Depressed, Hopeless 0 0  PHQ - 2 Score 0  Altered sleeping Tired, decreased energy Change in appetite 2 0 1 1   Feeling bad or failure about yourself  2 0 2 1   Trouble concentrating 0   Moving slowly or fidgety/restless 0 0 0 0   Suicidal thoughts 0 0 0 0   PHQ-9 Score 09/05/2022   10:52 AM 02/19/2021    8:58 AM 11/02/2020    2:04 PM 09/13/2020    9:33 AM  GAD 7 : Generalized Anxiety Score  Nervous, Anxious, on Edge Control/stop worrying Worry too much - different things Trouble relaxing Restless Easily annoyed or irritable Afraid - awful might happen Total GAD 7 Score Review of Systems:   Pertinent items are noted in HPI Denies abnormal vaginal discharge w/ itching/odor/irritation, headaches, visual changes, shortness of breath, chest pain, abdominal pain, severe nausea/vomiting, or problems with urination or bowel movements unless otherwise stated above. Pertinent History Reviewed:  Reviewed past medical,surgical,  social, obstetrical and family history.  Reviewed problem list, medications and allergies. Physical Assessment:   Vitals:   02/03/23 1037  BP: 127/83  Pulse: 99  Weight: 248 lb (112.5 kg)  Body mass index is 41.27 kg/m.           Physical Examination:   General appearance: alert, well appearing, and in no distress  Mental status: alert, oriented to person, place, and time  Skin: warm & dry   Extremities: Edema: None    Cardiovascular: normal heart rate noted  Respiratory: normal respiratory effort, no distress  Abdomen: gravid, soft, non-tender  Pelvic: Cervical exam deferred         Fetal Status:     Movement: Present    Fetal Surveillance Testing today: NST: FHR baseline 120 bpm, Variability: moderate, Accelerations:present, Decelerations:  Absent= Cat 1/reactive Toco: none    Chaperone: N/A    No results found for this or any previous visit (from the past 24 hour(s)).  Assessment & Plan:  High-risk pregnancy: [id number] at [redacted]w[redacted]d with an Estimated Date of Delivery: 03/16/23   1) GHTN, stable,  no meds, asymptomatic. Reviewed pre-e s/s, reasons to seek care. Home bp cuff not accurate per pt   2) FNAIT, managed by [facility], next appt 4/18 for u/s and visit. Per pt-plan is PCS w/ BTL on 4/30 there   Meds: No orders of the defined types were placed in this encounter.   Labs/procedures today: NST  Treatment Plan:  2x/wk testing, C/S 4/30 @ [facility]  Reviewed: Preterm labor symptoms and general obstetric precautions including but not limited to vaginal bleeding, contractions, leaking of fluid and fetal movement were reviewed in detail with the patient.  All questions were answered.  Follow-up: Return for As scheduled.   Future Appointments  Date Time Provider Department Center  02/05/2023  8:00 AM [facility]-RM5 [facility] None  02/11/2023 10:10 AM [facility]-FTOBGYN NURSE [facility]  02/12/2023  8:00 AM [facility]-RM5 [facility] None  02/14/2023 10:45 AM [facility] - FTO[facility]YN [place] [facility]-FT[facility]G None  02/14/2023  11:30 AM Arabella Merles, CNM [facility]TO[facility]  02/17/2023  9:50 AM [facility]-FTOBGYN NURSE [facility]-FT [facility]TO[facility]  02/20/2023 10:00 AM [facility] - FTO[facility]YN [place] [facility]-FT[facility]G None  02/20/2023 11:10 AM Myna Hidalgo, DO [facility]-FT FTOBGYN    No orders of the defined types were placed in this encounter.  Cheral Marker CNM, [facility] 02/03/2023 11:28 AM

## 2023-02-05 ENCOUNTER — Encounter (HOSPITAL_COMMUNITY)
Admission: RE | Admit: 2023-02-05 | Discharge: 2023-02-05 | Disposition: A | Discharge status: Home / Self Care | Payer: Medicaid Other | Source: Ambulatory Visit | Attending: Maternal & Fetal Medicine | Admitting: Maternal & Fetal Medicine

## 2023-02-05 DIAGNOSIS — O0993 Supervision of high risk pregnancy, unspecified, third trimester: Secondary | ICD-10-CM | POA: Diagnosis not present

## 2023-02-05 MED ORDER — IMMUNE GLOBULIN (HUMAN) 10 GM/100ML IV SOLN
1.0000 g/kg | INTRAVENOUS | Status: DC
  Administered 2023-02-05: 110 g via INTRAVENOUS
  Filled 2023-02-05: qty 1100

## 2023-02-10 ENCOUNTER — Other Ambulatory Visit: Payer: Medicaid Other

## 2023-02-11 ENCOUNTER — Ambulatory Visit (INDEPENDENT_AMBULATORY_CARE_PROVIDER_SITE_OTHER): Payer: Medicaid Other | Admitting: *Deleted

## 2023-02-11 VITALS — BP 136/84 | HR 87 | Wt 251.0 lb

## 2023-02-11 DIAGNOSIS — O133 Gestational [pregnancy-induced] hypertension without significant proteinuria, third trimester: Secondary | ICD-10-CM

## 2023-02-11 DIAGNOSIS — O0993 Supervision of high risk pregnancy, unspecified, third trimester: Secondary | ICD-10-CM | POA: Diagnosis not present

## 2023-02-11 DIAGNOSIS — Z3A35 35 weeks gestation of pregnancy: Secondary | ICD-10-CM

## 2023-02-11 NOTE — Progress Notes (Signed)
   NURSE VISIT- NST  SUBJECTIVE:  Veronica Hendrix is a 37 y.o. [phone] female at [redacted]w[redacted]d, here for a NST for pregnancy complicated by [facility].  She reports active fetal movement, contractions: none, vaginal bleeding: none, membranes: intact.   OBJECTIVE:  BP 136/84   Pulse 87   Wt 251 lb (113.9 kg)   LMP 06/09/2022 (Approximate)   BMI 41.77 kg/m   Appears well, no apparent distress  No results found for this or any previous visit (from the past 24 hour(s)).  NST: FHR baseline 115 bpm, Variability: moderate, Accelerations:present, Decelerations:  Absent= Cat 1/reactive Toco: none   ASSESSMENT: [id number] at [redacted]w[redacted]d with GHTN NST reactive  PLAN: EFM strip reviewed by [name], CNM, [facility]   Recommendations: keep next appointment as scheduled    Jobe Marker  02/11/2023 11:18 AM

## 2023-02-12 ENCOUNTER — Encounter (HOSPITAL_COMMUNITY)
Admission: RE | Admit: 2023-02-12 | Discharge: 2023-02-12 | Disposition: A | Discharge status: Home / Self Care | Payer: Medicaid Other | Source: Ambulatory Visit | Attending: Maternal & Fetal Medicine | Admitting: Maternal & Fetal Medicine

## 2023-02-12 ENCOUNTER — Other Ambulatory Visit: Payer: Self-pay | Admitting: Obstetrics & Gynecology

## 2023-02-12 DIAGNOSIS — O0993 Supervision of high risk pregnancy, unspecified, third trimester: Secondary | ICD-10-CM | POA: Diagnosis not present

## 2023-02-12 DIAGNOSIS — O133 Gestational [pregnancy-induced] hypertension without significant proteinuria, third trimester: Secondary | ICD-10-CM

## 2023-02-12 MED ORDER — IMMUNE GLOBULIN (HUMAN) 10 GM/100ML IV SOLN
1.0000 g/kg | INTRAVENOUS | Status: DC
  Administered 2023-02-12: 110 g via INTRAVENOUS
  Filled 2023-02-12: qty 1100

## 2023-02-14 ENCOUNTER — Ambulatory Visit (INDEPENDENT_AMBULATORY_CARE_PROVIDER_SITE_OTHER): Payer: Medicaid Other | Admitting: Advanced Practice Midwife

## 2023-02-14 ENCOUNTER — Other Ambulatory Visit: Payer: Self-pay | Admitting: Obstetrics & Gynecology

## 2023-02-14 ENCOUNTER — Encounter: Payer: Self-pay | Admitting: Advanced Practice Midwife

## 2023-02-14 ENCOUNTER — Ambulatory Visit (INDEPENDENT_AMBULATORY_CARE_PROVIDER_SITE_OTHER): Payer: Medicaid Other

## 2023-02-14 VITALS — BP 131/88 | HR 72 | Wt 254.6 lb

## 2023-02-14 DIAGNOSIS — Z3A35 35 weeks gestation of pregnancy: Secondary | ICD-10-CM | POA: Diagnosis not present

## 2023-02-14 DIAGNOSIS — O368130 Decreased fetal movements, third trimester, not applicable or unspecified: Secondary | ICD-10-CM

## 2023-02-14 DIAGNOSIS — O0993 Supervision of high risk pregnancy, unspecified, third trimester: Secondary | ICD-10-CM

## 2023-02-14 DIAGNOSIS — O133 Gestational [pregnancy-induced] hypertension without significant proteinuria, third trimester: Secondary | ICD-10-CM

## 2023-02-14 NOTE — Progress Notes (Signed)
[place] 35+5 wks,frank breech,FHR 130 bpm,BPP 8/8,anterior placenta gr 3,AFI 17 cm,RI .49,.59,.54=12%

## 2023-02-14 NOTE — Progress Notes (Signed)
HIGH-RISK PREGNANCY VISIT Patient name: Veronica Hendrix MRN [id number]  Date of birth: 1985-12-24 Chief Complaint:   Routine Prenatal Visit and Pregnancy Ultrasound (Nst done in [place] yesterday/ contraction)  History of Present Illness:   Veronica Hendrix is a 37 y.o. [phone] female at [redacted]w[redacted]d with an Estimated Date of Delivery: 03/16/23 being seen today for ongoing management of a high-risk pregnancy complicated by gHTN (no meds) and FNAIT.    Today she reports  being ready for delivery on 4/30 . Contractions: Irregular.  .  Movement: Present. denies leaking of fluid.      09/05/2022   10:52 AM 02/19/2021    8:57 AM 11/02/2020    2:04 PM 09/13/2020    9:33 AM 05/21/2018    4:12 PM  Depression screen PHQ 2/9  Decreased Interest 2 2 1 1  0  Down, Depressed, Hopeless 2 1 1  0 0  PHQ - 2 Score 4 3 2 1  0  Altered sleeping 2 2 1 1    Tired, decreased energy 2 1 2 1    Change in appetite 2 0 1 1   Feeling bad or failure about yourself  2 0 2 1   Trouble concentrating 1 2 1  0   Moving slowly or fidgety/restless 0 0 0 0   Suicidal thoughts 0 0 0 0   PHQ-9 Score 13 8 9 5          09/05/2022   10:52 AM 02/19/2021    8:58 AM 11/02/2020    2:04 PM 09/13/2020    9:33 AM  GAD 7 : Generalized Anxiety Score  Nervous, Anxious, on Edge 3 2 3 1   Control/stop worrying 2 2 3 1   Worry too much - different things 2 2 3 1   Trouble relaxing 2 1 2 1   Restless 2 1 2 1   Easily annoyed or irritable 2 2 3 1   Afraid - awful might happen 1 1 1 1   Total GAD 7 Score 14 11 17 7      Review of Systems:   Pertinent items are noted in HPI Denies abnormal vaginal discharge w/ itching/odor/irritation, headaches, visual changes, shortness of breath, chest pain, abdominal pain, severe nausea/vomiting, or problems with urination or bowel movements unless otherwise stated above. Pertinent History Reviewed:  Reviewed past medical,surgical, social, obstetrical and family history.  Reviewed problem list, medications and  allergies. Physical Assessment:   Vitals:   02/14/23 1103  BP: 131/88  Pulse: 72  Weight: 115.5 kg  Body mass index is 42.37 kg/m.           Physical Examination:   General appearance: alert, well appearing, and in no distress  Mental status: alert, oriented to person, place, and time  Skin: warm & dry   Extremities: Edema: Trace    Cardiovascular: normal heart rate noted  Respiratory: normal respiratory effort, no distress  Abdomen: gravid, soft, non-tender  Pelvic: Cervical exam deferred         Fetal Status: Fetal Heart Rate (bpm): 130 u/s   Movement: Present    Fetal Surveillance Testing today: [place] 35+5 wks,frank breech,FHR 130 bpm,BPP 8/8,anterior placenta gr 3,AFI 17 cm,RI .49,.59,.54=12%     No results found for this or any previous visit (from the past 24 hour(s)).  Assessment & Plan:  High-risk pregnancy: [id number] at [redacted]w[redacted]d with an Estimated Date of Delivery: 03/16/23   1) gHTN, stable without meds  2) FNAIT, has pLTCS and BTL (consent 3/19) on 4/30 at [facility]  Meds: No  orders of the defined types were placed in this encounter.   Labs/procedures today: U/S  Treatment Plan:  ante testing 4/29; delivery at [facility] 4/30  Reviewed: Preterm labor symptoms and general obstetric precautions including but not limited to vaginal bleeding, contractions, leaking of fluid and fetal movement were reviewed in detail with the patient.  All questions were answered. Does have home bp cuff. Office bp cuff given: not applicable. Check bp daily, let us know if consistently >140 and/or >90.  Follow-up: Return for cancel 5/2 appt; make a 1-2wk post C/S incision check, then a 6 week PP visit.   Future Appointments  Date Time Provider Department Center  02/17/2023  9:50 AM [facility]-FT[facility]N NURSE [facility]  02/20/2023 10:00 AM [facility] - FTO[facility]YN [place] C[facility]-FTIMG None  02/20/2023 11:10 AM Myna Hidalgo, DO [facility]-FT [facility]TO[facility]YN    No orders of the defined types were placed in this encounter.  Arabella Merles CNM 02/14/2023 11:40 AM

## 2023-02-17 ENCOUNTER — Ambulatory Visit (INDEPENDENT_AMBULATORY_CARE_PROVIDER_SITE_OTHER): Payer: Medicaid Other | Admitting: *Deleted

## 2023-02-17 VITALS — BP 125/89 | HR 97 | Wt 253.0 lb

## 2023-02-17 DIAGNOSIS — Z3A36 36 weeks gestation of pregnancy: Secondary | ICD-10-CM | POA: Diagnosis not present

## 2023-02-17 DIAGNOSIS — O0993 Supervision of high risk pregnancy, unspecified, third trimester: Secondary | ICD-10-CM

## 2023-02-17 DIAGNOSIS — O133 Gestational [pregnancy-induced] hypertension without significant proteinuria, third trimester: Secondary | ICD-10-CM | POA: Diagnosis not present

## 2023-02-17 NOTE — Progress Notes (Signed)
   NURSE VISIT- NST  SUBJECTIVE:  Paden RHEN DOSSANTOS is a 37 y.o. [phone] female at [redacted]w[redacted]d, here for a NST for pregnancy complicated by [facility].  She reports active fetal movement, contractions: irregular, vaginal bleeding: none, membranes: intact.   OBJECTIVE:  BP 125/89   Pulse 97   Wt 253 lb (114.8 kg)   LMP 06/09/2022 (Approximate)   BMI 42.10 kg/m   Appears well, no apparent distress  No results found for this or any previous visit (from the past 24 hour(s)).  NST: FHR baseline 115 bpm, Variability: moderate, Accelerations:present, Decelerations:  Absent= Cat 1/reactive Toco: none   ASSESSMENT: [id number] at [redacted]w[redacted]d with GHTN NST reactive  PLAN: EFM strip reviewed by [name], CNM, [facility]   Recommendations: keep next appointment as scheduled    Jobe Marker  02/17/2023 11:59 AM

## 2023-02-19 ENCOUNTER — Encounter (HOSPITAL_COMMUNITY): Payer: Medicaid Other

## 2023-02-20 ENCOUNTER — Other Ambulatory Visit: Payer: Medicaid Other

## 2023-02-20 ENCOUNTER — Encounter: Payer: Medicaid Other | Admitting: Obstetrics & Gynecology

## 2023-02-24 ENCOUNTER — Other Ambulatory Visit: Payer: Medicaid Other

## 2023-02-27 ENCOUNTER — Encounter: Payer: Medicaid Other | Admitting: Advanced Practice Midwife

## 2023-02-28 ENCOUNTER — Inpatient Hospital Stay (HOSPITAL_COMMUNITY)
Admission: AD | Admit: 2023-02-28 | Discharge: 2023-02-28 | Disposition: A | Discharge status: Home / Self Care | Payer: Medicaid Other | Attending: Obstetrics and Gynecology | Admitting: Obstetrics and Gynecology

## 2023-02-28 ENCOUNTER — Encounter (HOSPITAL_COMMUNITY): Payer: Self-pay | Admitting: Obstetrics and Gynecology

## 2023-02-28 ENCOUNTER — Telehealth: Payer: Self-pay | Admitting: *Deleted

## 2023-02-28 DIAGNOSIS — T148XXA Other injury of unspecified body region, initial encounter: Secondary | ICD-10-CM | POA: Insufficient documentation

## 2023-02-28 DIAGNOSIS — O902 Hematoma of obstetric wound: Secondary | ICD-10-CM | POA: Insufficient documentation

## 2023-02-28 NOTE — Telephone Encounter (Signed)
Called received from patient stating she has noticed a golf ball sized knot on the left side of her incision along with some pain.  States the area is noticeable when laying flat.  C/S on 4/30 in [place].  Discussed with Dr Despina Hidden and patient advised to go to [facility] for evaluation.  Pt agreeable to plan.

## 2023-02-28 NOTE — MAU Note (Signed)
PP c-section on 4/30.  Reports a "big lump" on left side of incision that  hurts. That started yesterday. Denies any drainage. No fever but had some cold chills last night . On Lovenox.  Dr. Tawanna Sat at bedside to evaluate incision.

## 2023-02-28 NOTE — MAU Provider Note (Signed)
  History     CSN: [id number]  Arrival date and time: 02/28/23 1342   None     Chief Complaint  Patient presents with   Wound Check   HPI  POD10 from c/s at [facility] on 4/30. Yesterday noticed slightly painful swelling left side of skin incision. No drainage, no fevers, no skin changes, otherwise feeling well. Called clinic today and they advised coming in. Thinks today it's a little smaller and less painful than yesterday.    Past Medical History:  Diagnosis Date   Bipolar 1 disorder (HCC)    Depression    Headache    History of pre-eclampsia in prior pregnancy, currently pregnant    HPV in female    Panic attacks    Post traumatic stress disorder (PTSD)    Vision abnormalities     Past Surgical History:  Procedure Laterality Date   BLOOD PATCH     CHOLECYSTECTOMY N/A 05/2021    Family History  Problem Relation Age of Onset   Diabetes Mother    Depression Mother    Mental illness Father    Hypertension Father    Depression Father    Anxiety disorder Father    Bipolar disorder Father    Hydrocephalus Son    Heart disease Maternal Grandmother    Heart disease Maternal Grandfather    Heart disease Paternal Grandfather     Social History   Tobacco Use   Smoking status: Never   Smokeless tobacco: Never  Vaping Use   Vaping Use: Never used  Substance Use Topics   Alcohol use: No   Drug use: No    Allergies: No Known Allergies  Medications Prior to Admission  Medication Sig Dispense Refill Last Dose   aspirin EC 81 MG tablet Take 81 mg by mouth daily. Swallow whole.      brexpiprazole (REXULTI) 1 MG TABS tablet Take 1 mg by mouth daily. Taking 1.5mg       busPIRone (BUSPAR) 10 MG tablet Take by mouth.      pantoprazole (PROTONIX) 20 MG tablet Take 1 tablet (20 mg total) by mouth daily. 30 tablet 1    predniSONE (DELTASONE) 10 MG tablet Take 10 mg by mouth daily with breakfast.      Prenatal Vit-Fe Fumarate-FA (PRENATAL VITAMIN PO) Take 1 tablet by mouth  daily.      promethazine (PHENERGAN) 25 MG tablet Take 1 tablet (25 mg total) by mouth every 6 (six) hours as needed for nausea or vomiting. 30 tablet 1    sertraline (ZOLOFT) 100 MG tablet Take 250 mg by mouth. Taking 250       Review of Systems Physical Exam   Blood pressure 133/87, pulse 84, temperature 98.2 F (36.8 C), resp. rate 18, unknown if currently breastfeeding.  Physical Exam NAD Incision with steri-strips in place. C/d/I. Left lateral margin about 3 cm subcutaneous nodule that is mildly tender, not fluctuant, no skin changes, no drainage from skin incision.     Assessment and Plan  POD#10 from [facility] section at [facility]. Had gestational hypertension. Here with one day swelling left side of skin incision. Suspect this is a hematoma. It is stable to improved today compared to yesterday. No skin changes or significant tenderness or drainage to suspect infection at this time. Advise close monitoring, re-evaluation should symptoms worsen or fail to resolve. BP wnl today, is due for postpartum f/u and advised to schedule that.   Cherrie Gauze [place] Yeats 02/28/2023, 4:06 PM

## 2023-03-03 ENCOUNTER — Other Ambulatory Visit: Payer: Medicaid Other

## 2023-03-10 ENCOUNTER — Other Ambulatory Visit: Payer: Medicaid Other

## 2023-03-31 ENCOUNTER — Ambulatory Visit: Payer: Medicaid Other | Admitting: Obstetrics & Gynecology
